# Patient Record
Sex: Female | Born: 1995 | Race: Asian | Hispanic: No | Marital: Single | Smoking: Former smoker
Health system: Southern US, Community
[De-identification: ages and names within clinical notes are randomized; demographics above are authoritative.]

## PROBLEM LIST (undated history)

## (undated) ENCOUNTER — Inpatient Hospital Stay (HOSPITAL_COMMUNITY): Payer: Self-pay

## (undated) DIAGNOSIS — O24419 Gestational diabetes mellitus in pregnancy, unspecified control: Secondary | ICD-10-CM

---
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
## 2013-03-10 ENCOUNTER — Inpatient Hospital Stay (HOSPITAL_COMMUNITY)
Admission: AD | Admit: 2013-03-10 | Discharge: 2013-03-11 | Disposition: A | Discharge status: Home / Self Care | Payer: Medicaid Other | Source: Ambulatory Visit | Attending: Obstetrics and Gynecology | Admitting: Obstetrics and Gynecology

## 2013-03-10 ENCOUNTER — Encounter (HOSPITAL_COMMUNITY): Payer: Self-pay | Admitting: *Deleted

## 2013-03-10 DIAGNOSIS — R109 Unspecified abdominal pain: Secondary | ICD-10-CM | POA: Insufficient documentation

## 2013-03-10 DIAGNOSIS — O034 Incomplete spontaneous abortion without complication: Secondary | ICD-10-CM | POA: Insufficient documentation

## 2013-03-10 LAB — URINE MICROSCOPIC-ADD ON

## 2013-03-10 LAB — URINALYSIS, ROUTINE W REFLEX MICROSCOPIC
Protein, ur: NEGATIVE mg/dL
Urobilinogen, UA: 0.2 mg/dL (ref 0.0–1.0)

## 2013-03-10 LAB — POCT PREGNANCY, URINE: Preg Test, Ur: POSITIVE — AB

## 2013-03-10 NOTE — MAU Note (Signed)
Pt reports she has been having spotting since last Thursday. Had U/s and was told she was [redacted] weeks pregnant. Pt c/o cramping and more bleeding.

## 2013-03-11 ENCOUNTER — Inpatient Hospital Stay (HOSPITAL_COMMUNITY): Payer: Medicaid Other

## 2013-03-11 ENCOUNTER — Encounter (HOSPITAL_COMMUNITY): Payer: Self-pay | Admitting: *Deleted

## 2013-03-11 DIAGNOSIS — O034 Incomplete spontaneous abortion without complication: Secondary | ICD-10-CM

## 2013-03-11 LAB — ABO/RH: ABO/RH(D): B POS

## 2013-03-11 NOTE — MAU Provider Note (Signed)
Chief Complaint: Vaginal Bleeding  First Provider Initiated Contact with Patient 03/11/13 0424     SUBJECTIVE HPI: Connie White is a 17 y.o. G1P0 at [redacted]w[redacted]d by ultrasound one week ago at [facility] who presents with increased vaginal bleeding and cramping over the past week.  Denies fever, chills, passage of tissue, vaginal discharge. Pelvic exam done at office visit 1 week ago.   Past Medical History  Diagnosis Date  . Medical history non-contributory    OB History   Grav Para Term Preterm Abortions TAB SAB Ect Mult Living   1              # Outc Date GA Lbr Len/2nd Wgt Sex Del Anes PTL Lv   1 CUR              Past Surgical History  Procedure Laterality Date  . No past surgeries     History   Social History  . Marital Status: Single    Spouse Name: N/A    Number of Children: N/A  . Years of Education: N/A   Occupational History  . Not on file.   Social History Main Topics  . Smoking status: Former Games developer  . Smokeless tobacco: Not on file  . Alcohol Use: No  . Drug Use: No  . Sexually Active: Yes   Other Topics Concern  . Not on file   Social History Narrative  . No narrative on file   No current facility-administered medications on file prior to encounter.   No current outpatient prescriptions on file prior to encounter.   No Known Allergies  ROS: Pertinent items in HPI  OBJECTIVE Blood pressure 133/79, pulse 88, temperature 98.7 F (37.1 C), temperature source Oral, resp. rate 18, height 4\' 7"  (1.397 m), weight 62.234 kg (137 lb 3.2 oz), last menstrual period 12/26/2012. GENERAL: Well-developed, well-nourished female in no acute distress.  HEENT: Normocephalic HEART: normal rate RESP: normal effort ABDOMEN: Soft, non-tender EXTREMITIES: Nontender, no edema NEURO: Alert and oriented SPECULUM EXAM: Deferred. Scant brown blood on pad.   LAB RESULTS Results for orders placed during the hospital encounter of 03/10/13 (from the past 24 hour(s))   URINALYSIS, ROUTINE W REFLEX MICROSCOPIC     Status: Abnormal   Collection Time    03/10/13 10:10 PM      Result Value Range   Color, Urine YELLOW  YELLOW   APPearance CLEAR  CLEAR   Specific Gravity, Urine 1.010  1.005 - 1.030   pH 7.0  5.0 - 8.0   Glucose, UA NEGATIVE  NEGATIVE mg/dL   Hgb urine dipstick LARGE (*) NEGATIVE   Bilirubin Urine NEGATIVE  NEGATIVE   Ketones, ur NEGATIVE  NEGATIVE mg/dL   Protein, ur NEGATIVE  NEGATIVE mg/dL   Urobilinogen, UA 0.2  0.0 - 1.0 mg/dL   Nitrite NEGATIVE  NEGATIVE   Leukocytes, UA SMALL (*) NEGATIVE  URINE MICROSCOPIC-ADD ON     Status: Abnormal   Collection Time    03/10/13 10:10 PM      Result Value Range   Squamous Epithelial / LPF MANY (*) RARE   WBC, UA 7-10  <3 WBC/hpf   RBC / HPF 11-20  <3 RBC/hpf   Bacteria, UA FEW (*) RARE  POCT PREGNANCY, URINE     Status: Abnormal   Collection Time    03/10/13 10:35 PM      Result Value Range   Preg Test, Ur POSITIVE (*) NEGATIVE  ABO/RH  Status: None   Collection Time    03/11/13  3:55 AM      Result Value Range   ABO/RH(D) B POS     IMAGING US Ob Comp Less 14 Wks  03/11/2013   *RADIOLOGY REPORT*  Clinical Data: same,bleeding in pregnancy. IUP confirmed on office [place] 1 week ago.; ;  OBSTETRIC <14 WK [place] AND TRANSVAGINAL OB US  Technique: Both transabdominal and transvaginal ultrasound examinations were performed for complete evaluation of the gestation as well as the maternal uterus, adnexal regions, and pelvic cul-de-sac.  Comparison: None.  Findings: Single intrauterine gestation is visualized.  Crown-rump length is 7.8 mm for an estimated gestational age of [redacted] weeks 5 days.  No cardiac activity detected.  Large subchorionic hemorrhage.  Ovaries are symmetric in size and echotexture.  No adnexal masses. No free fluid.  IMPRESSION: 6-week-5-day intrauterine pregnancy with large subchorionic hemorrhage.  No cardiac activity detected.  Recommend continued follow up.   Original Report  Authenticated By: Charlett Nose, M.D.   US Ob Transvaginal  03/11/2013   *RADIOLOGY REPORT*  Clinical Data: same,bleeding in pregnancy. IUP confirmed on office [place] 1 week ago.; ;  OBSTETRIC <14 WK [place] AND TRANSVAGINAL OB US  Technique: Both transabdominal and transvaginal ultrasound examinations were performed for complete evaluation of the gestation as well as the maternal uterus, adnexal regions, and pelvic cul-de-sac.  Comparison: None.  Findings: Single intrauterine gestation is visualized.  Crown-rump length is 7.8 mm for an estimated gestational age of [redacted] weeks 5 days.  No cardiac activity detected.  Large subchorionic hemorrhage.  Ovaries are symmetric in size and echotexture.  No adnexal masses. No free fluid.  IMPRESSION: 6-week-5-day intrauterine pregnancy with large subchorionic hemorrhage.  No cardiac activity detected.  Recommend continued follow up.   Original Report Authenticated By: Charlett Nose, M.D.    MAU COURSE  ASSESSMENT 1. Incomplete miscarriage     PLAN Discharge home in stable condition per consult w/ Dr. Tenny Craw. Discussed SAB management options including expectant management, cytotec of D&C if medically necessary. Pt prefers expectant management at this time.  Support given. Bleeding precautions.   Follow-up Information   Follow up with [facility] (1 week after pregnancy tissue passed or call in one week if no tissue passed. )    Contact information:   [address] [place] [address] [phone]      Follow up with [facility] [place] MATERNITY ADMISSIONS. (As needed for heavy bleeding, severe pain or fever greater than 100.4.)    Contact information:   [address] [id number] [place] [address] [phone]        Medication List    TAKE these medications       multivitamin-prenatal 27-0.8 MG Tabs  Take 1 tablet by mouth daily at 12 noon.       [place],  CNM 03/11/2013  4:30 AM

## 2013-03-12 LAB — URINE CULTURE: Colony Count: NO GROWTH

## 2013-09-16 NOTE — L&D Delivery Note (Signed)
Delivery Note At 2:26 PM a viable female was delivered via Vaginal, Spontaneous Delivery (Presentation: ; Occiput Anterior).  APGAR: , ; weight 5 lb 15.2 oz (2700 g).   Placenta status: Intact, Spontaneous.  Cord: 3 vessels with the following complications: None.  Cord pH: unable to obtain  Anesthesia: None  Episiotomy: None Lacerations: Perineal;1st degree Suture Repair: 3.0 vicryl rapide Est. Blood Loss (mL): 300  Mom to postpartum.  Baby to NICU. Placenta to: Pathology Feeding: Breast Circ: No Contraception: undecided  Delivery and repair performed by Ethelda Chickaroline Roberts MD under my supervision.  Dorathy KinsmanSMITH, Doralyn Kirkes 04/06/2014, 3:32 PM

## 2014-01-13 ENCOUNTER — Encounter (HOSPITAL_COMMUNITY): Payer: Self-pay | Admitting: *Deleted

## 2014-01-21 ENCOUNTER — Encounter (HOSPITAL_COMMUNITY): Payer: Self-pay | Admitting: *Deleted

## 2014-01-21 ENCOUNTER — Inpatient Hospital Stay (HOSPITAL_COMMUNITY)
Admission: AD | Admit: 2014-01-21 | Discharge: 2014-01-21 | Disposition: A | Discharge status: Home / Self Care | Payer: Medicaid Other | Source: Ambulatory Visit | Attending: Family Medicine | Admitting: Family Medicine

## 2014-01-21 DIAGNOSIS — O99891 Other specified diseases and conditions complicating pregnancy: Secondary | ICD-10-CM | POA: Diagnosis not present

## 2014-01-21 DIAGNOSIS — Z3200 Encounter for pregnancy test, result unknown: Secondary | ICD-10-CM

## 2014-01-21 DIAGNOSIS — O0930 Supervision of pregnancy with insufficient antenatal care, unspecified trimester: Secondary | ICD-10-CM | POA: Insufficient documentation

## 2014-01-21 DIAGNOSIS — O9989 Other specified diseases and conditions complicating pregnancy, childbirth and the puerperium: Principal | ICD-10-CM

## 2014-01-21 NOTE — MAU Provider Note (Signed)
  HPI:  Ms. Connie White is a 18 y.o. female G2P0011 at 6460w4d who presents to MAU for a checkup. She has no prenatal care to date.  She went to the clinic this morning to try and be seen today, however she was sent here. She reports good fetal movement, denies LOF, vaginal bleeding, vaginal itching/burning, urinary symptoms, h/a, dizziness, n/v, or fever/chills.  Currently the patient has no pregnancy concerns, she just wants to start prenatal care.    Objective:  GENERAL: Well-developed, well-nourished female in no acute distress.  HEENT: Normocephalic, atraumatic.   LUNGS: Effort normal HEART: Regular rate  SKIN: Warm, dry and without erythema PSYCH: Normal mood and affect  Filed Vitals:   01/21/14 0915  BP: 131/83  Pulse: 112  Temp: 98.2 F (36.8 C)  Resp: 18    MDM  +fht  A:  1. No prenatal care in current pregnancy   2. Encounter for pregnancy test     P:  Discharge home in stable condition Referral sent to the clinic; pt scheduled in May Detailed US scheduled; radiology to call patient to confirm appt.  Warning signs discussed Return to MAU with any pain and/or bleeding   Iona HansenJennifer Irene Shene Maxfield, NP 01/21/2014 3:05 PM

## 2014-01-21 NOTE — MAU Note (Signed)
Pt presents to MAU stating that she needs a OB check up because she is [redacted] weeks pregnant LMP 07/26/2013. Denies any vaginal bleeding or pain. She says she went to the clinic and was told that she had to have an appointment.

## 2014-01-21 NOTE — MAU Provider Note (Signed)
Attestation of Attending Supervision of Advanced Practitioner (PA/CNM/NP): Evaluation and management procedures were performed by the Advanced Practitioner under my supervision and collaboration.  I have reviewed the Advanced Practitioner's note and chart, and I agree with the management and plan.  Layton Naves S Jurni Cesaro, MD [facility] Attending 01/21/2014 4:38 PM   

## 2014-01-27 ENCOUNTER — Ambulatory Visit (HOSPITAL_COMMUNITY): Admit: 2014-01-27 | Payer: Medicaid Other

## 2014-02-09 ENCOUNTER — Ambulatory Visit (INDEPENDENT_AMBULATORY_CARE_PROVIDER_SITE_OTHER): Payer: Medicaid Other | Admitting: Advanced Practice Midwife

## 2014-02-09 ENCOUNTER — Encounter: Payer: Self-pay | Admitting: Advanced Practice Midwife

## 2014-02-09 VITALS — BP 122/75 | HR 108 | Temp 98.3°F | Wt 148.1 lb

## 2014-02-09 DIAGNOSIS — O0930 Supervision of pregnancy with insufficient antenatal care, unspecified trimester: Secondary | ICD-10-CM | POA: Insufficient documentation

## 2014-02-09 LAB — POCT URINALYSIS DIP (DEVICE)
Bilirubin Urine: NEGATIVE
Glucose, UA: NEGATIVE mg/dL
Hgb urine dipstick: NEGATIVE
Ketones, ur: NEGATIVE mg/dL
Nitrite: NEGATIVE
PROTEIN: NEGATIVE mg/dL
SPECIFIC GRAVITY, URINE: 1.015 (ref 1.005–1.030)
UROBILINOGEN UA: 1 mg/dL (ref 0.0–1.0)
pH: 7.5 (ref 5.0–8.0)

## 2014-02-09 NOTE — Progress Notes (Signed)
Occasional edema in feet.  Initial visit. New OB labs and 1hr gtt today. Tdap today.  Discussed appropriate weight gain. Pt. Verbalized understanding.  New OB packet given.

## 2014-02-09 NOTE — Progress Notes (Signed)
   Subjective:    Connie White is a G2P0011 [redacted]w[redacted]d being seen today for her first obstetrical visit.  Her obstetrical history is significant for None G1. Patient does intend to breast feed. Pregnancy history fully reviewed.  Patient reports no complaints.  Filed Vitals:   02/09/14 1420  BP: 122/75  Pulse: 108  Temp: 98.3 F (36.8 C)  Weight: 148 lb 1.6 oz (67.178 kg)    HISTORY: OB History  Gravida Para Term Preterm AB SAB TAB Ectopic Multiple Living  2 0 0 0 1 1 0 0 0 1     # Outcome Date GA Lbr Len/2nd Weight Sex Delivery Anes PTL Lv  2 CUR           1 SAB 2014             Past Medical History  Diagnosis Date  . Medical history non-contributory    Past Surgical History  Procedure Laterality Date  . No past surgeries     History reviewed. No pertinent family history.   Exam    Uterus:   FH 28 cm  Pelvic Exam:    Perineum: No Hemorrhoids, Normal Perineum   Vulva: normal   Vagina:  normal mucosa, normal discharge   pH:    Cervix: no cervical motion tenderness   Adnexa: not evaluated   Bony Pelvis: average  System: Breast:  normal appearance, no masses or tenderness   Skin: normal coloration and turgor, no rashes    Neurologic: oriented, normal mood, gait normal; reflexes normal and symmetric   Extremities: normal strength, tone, and muscle mass, ROM of all joints is normal   HEENT neck supple with midline trachea and thyroid without masses   Mouth/Teeth mucous membranes moist, pharynx normal without lesions and dental hygiene good   Neck supple and no masses   Cardiovascular: regular rate and rhythm   Respiratory:  appears well, vitals normal, no respiratory distress, acyanotic, normal RR, ear and throat exam is normal, neck free of mass or lymphadenopathy, chest clear, no wheezing, crepitations, rhonchi, normal symmetric air entry   Abdomen: soft, non-tender; bowel sounds normal; no masses,  no organomegaly   Urinary: urethral meatus normal       Assessment:    Pregnancy: [id number] Patient Active Problem List   Diagnosis Date Noted  . Late prenatal care complicating pregnancy 02/09/2014  . ANEMIA, OTHER, UNSPECIFIED 11/13/2006  . HEADACHE, UNSPECIFIED 11/13/2006        Plan:     Initial labs drawn. Prenatal vitamins. Problem list reviewed and updated. Genetic Screening discussed: late to care   Ultrasound discussed; fetal survey: ordered.  Follow up in 2 weeks. 50% of 30 min visit spent on counseling and coordination of care.     Wilmer Floor Leftwich-Kirby 02/09/2014

## 2014-02-10 LAB — PRESCRIPTION MONITORING PROFILE (19 PANEL)
Amphetamine/Meth: NEGATIVE ng/mL
Barbiturate Screen, Urine: NEGATIVE ng/mL
Benzodiazepine Screen, Urine: NEGATIVE ng/mL
Buprenorphine, Urine: NEGATIVE ng/mL
CANNABINOID SCRN UR: NEGATIVE ng/mL
CARISOPRODOL, URINE: NEGATIVE ng/mL
COCAINE METABOLITES: NEGATIVE ng/mL
CREATININE, URINE: 94.99 mg/dL (ref >20.0)
ECSTASY: NEGATIVE ng/mL
FENTANYL URINE: NEGATIVE ng/mL
MEPERIDINE UR: NEGATIVE ng/mL
METHADONE SCREEN, URINE: NEGATIVE ng/mL
Methaqualone: NEGATIVE ng/mL
Nitrites, Initial: NEGATIVE ug/mL
OPIATE SCREEN, URINE: NEGATIVE ng/mL
OXYCODONE SCRN UR: NEGATIVE ng/mL
PHENCYCLIDINE, UR: NEGATIVE ng/mL
Propoxyphene: NEGATIVE ng/mL
Tapentadol, urine: NEGATIVE ng/mL
Tramadol Scrn, Ur: NEGATIVE ng/mL
Zolpidem, Urine: NEGATIVE ng/mL
pH, Initial: 7.9 pH (ref 4.5–8.9)

## 2014-02-10 LAB — OBSTETRIC PANEL
Antibody Screen: NEGATIVE
Basophils Absolute: 0 10*3/uL (ref 0.0–0.1)
Basophils Relative: 0 % (ref 0–1)
EOS PCT: 1 % (ref 0–5)
Eosinophils Absolute: 0.1 10*3/uL (ref 0.0–0.7)
HCT: 31.9 % — ABNORMAL LOW (ref 36.0–46.0)
HEMOGLOBIN: 10.5 g/dL — AB (ref 12.0–15.0)
HEP B S AG: NEGATIVE
LYMPHS ABS: 1.6 10*3/uL (ref 0.7–4.0)
LYMPHS PCT: 13 % (ref 12–46)
MCH: 22.7 pg — ABNORMAL LOW (ref 26.0–34.0)
MCHC: 32.9 g/dL (ref 30.0–36.0)
MCV: 68.9 fL — AB (ref 78.0–100.0)
MONOS PCT: 8 % (ref 3–12)
Monocytes Absolute: 1 10*3/uL (ref 0.1–1.0)
NEUTROS PCT: 78 % — AB (ref 43–77)
Neutro Abs: 9.4 10*3/uL — ABNORMAL HIGH (ref 1.7–7.7)
PLATELETS: 406 10*3/uL — AB (ref 150–400)
RBC: 4.63 MIL/uL (ref 3.87–5.11)
RDW: 13.9 % (ref 11.5–15.5)
RH TYPE: POSITIVE
RUBELLA: 0.69 {index} (ref <0.90)
WBC: 12.1 10*3/uL — AB (ref 4.0–10.5)

## 2014-02-10 LAB — HIV ANTIBODY (ROUTINE TESTING W REFLEX): HIV 1&2 Ab, 4th Generation: NONREACTIVE

## 2014-02-10 LAB — GLUCOSE TOLERANCE, 1 HOUR (50G) W/O FASTING: Glucose, 1 Hour GTT: 140 mg/dL (ref 70–140)

## 2014-02-11 LAB — GC/CHLAMYDIA PROBE AMP
CT Probe RNA: NEGATIVE
GC Probe RNA: NEGATIVE

## 2014-02-11 LAB — CULTURE, OB URINE

## 2014-02-14 ENCOUNTER — Ambulatory Visit (HOSPITAL_COMMUNITY)
Admission: RE | Admit: 2014-02-14 | Discharge: 2014-02-14 | Disposition: A | Discharge status: Home / Self Care | Payer: Medicaid Other | Source: Ambulatory Visit | Attending: Obstetrics and Gynecology | Admitting: Obstetrics and Gynecology

## 2014-02-14 ENCOUNTER — Other Ambulatory Visit (HOSPITAL_COMMUNITY): Payer: Self-pay | Admitting: Obstetrics and Gynecology

## 2014-02-14 DIAGNOSIS — Z3200 Encounter for pregnancy test, result unknown: Secondary | ICD-10-CM

## 2014-02-14 DIAGNOSIS — Z3689 Encounter for other specified antenatal screening: Secondary | ICD-10-CM | POA: Insufficient documentation

## 2014-02-14 DIAGNOSIS — O0930 Supervision of pregnancy with insufficient antenatal care, unspecified trimester: Secondary | ICD-10-CM | POA: Insufficient documentation

## 2014-02-25 ENCOUNTER — Ambulatory Visit (INDEPENDENT_AMBULATORY_CARE_PROVIDER_SITE_OTHER): Payer: Medicaid Other | Admitting: Advanced Practice Midwife

## 2014-02-25 ENCOUNTER — Encounter: Payer: Self-pay | Admitting: Advanced Practice Midwife

## 2014-02-25 VITALS — BP 118/77 | HR 95 | Temp 97.7°F | Wt 148.0 lb

## 2014-02-25 DIAGNOSIS — Z3480 Encounter for supervision of other normal pregnancy, unspecified trimester: Secondary | ICD-10-CM

## 2014-02-25 DIAGNOSIS — Z3490 Encounter for supervision of normal pregnancy, unspecified, unspecified trimester: Secondary | ICD-10-CM

## 2014-02-25 LAB — POCT URINALYSIS DIP (DEVICE)
Bilirubin Urine: NEGATIVE
GLUCOSE, UA: NEGATIVE mg/dL
HGB URINE DIPSTICK: NEGATIVE
Ketones, ur: NEGATIVE mg/dL
NITRITE: NEGATIVE
Protein, ur: NEGATIVE mg/dL
Specific Gravity, Urine: 1.02 (ref 1.005–1.030)
UROBILINOGEN UA: 1 mg/dL (ref 0.0–1.0)
pH: 7.5 (ref 5.0–8.0)

## 2014-02-25 MED ORDER — RANITIDINE HCL 150 MG PO TABS: 150.0000 mg | ORAL_TABLET | Freq: Two times a day (BID) | ORAL | Status: DC

## 2014-02-25 NOTE — Progress Notes (Signed)
Pt is concerned she has a UTI or Vaginal infection due to odor and crampy feeling.  Pt reports pain in rt hand

## 2014-02-25 NOTE — Addendum Note (Signed)
Addended by: Aldona LentoFISHER, Daysen Gundrum L on: 02/25/2014 10:16 AM   Modules accepted: Orders

## 2014-02-25 NOTE — Progress Notes (Signed)
S: oing well.  Good fetal movement, denies vaginal bleeding, LOF, regular contractions.  Has daily heartburn, reports cramping and intermittent contractions x1 week.   Also reports pain with movement in her right hand, while cooking/cleaning. She also reports vaginal discharge with odor.   O: Mild pain to palpation in epigastric area and upper right abdomen.  Cervical exam and wet prep today. Urine results with small leukocytes so sent for culture. A: Acid reflux Preterm contractions P: Zantac 150 mg BID

## 2014-02-26 LAB — WET PREP, GENITAL
Trich, Wet Prep: NONE SEEN
Yeast Wet Prep HPF POC: NONE SEEN

## 2014-02-28 ENCOUNTER — Other Ambulatory Visit: Payer: Medicaid Other

## 2014-02-28 DIAGNOSIS — R7309 Other abnormal glucose: Secondary | ICD-10-CM

## 2014-02-28 LAB — POCT URINALYSIS DIP (DEVICE)
BILIRUBIN URINE: NEGATIVE
Glucose, UA: NEGATIVE mg/dL
Hgb urine dipstick: NEGATIVE
KETONES UR: NEGATIVE mg/dL
LEUKOCYTES UA: NEGATIVE
Nitrite: NEGATIVE
PH: 7.5 (ref 5.0–8.0)
Protein, ur: NEGATIVE mg/dL
Specific Gravity, Urine: 1.02 (ref 1.005–1.030)
Urobilinogen, UA: 1 mg/dL (ref 0.0–1.0)

## 2014-02-28 LAB — CULTURE, OB URINE

## 2014-03-01 ENCOUNTER — Telehealth: Payer: Self-pay

## 2014-03-01 LAB — GLUCOSE TOLERANCE, 3 HOURS
GLUCOSE 3 HOUR GTT: 147 mg/dL — AB (ref 70–144)
GLUCOSE, 1 HOUR-GESTATIONAL: 246 mg/dL — AB (ref 70–189)
GLUCOSE, 2 HOUR-GESTATIONAL: 144 mg/dL (ref 70–164)
GLUCOSE, FASTING-GESTATIONAL: 105 mg/dL — AB (ref 70–104)

## 2014-03-01 NOTE — Telephone Encounter (Signed)
Called pt and informed pt that her 3 hr glucose test resulted abnormal and that she is a GDM.  I advised pt that her appts will most likely be on Mondays cause we have diabetes educators on that day.  And I also advised pt that we need her to come in June 22nd for diabetes education.  Pt stated that she would be able to come in on June 22nd @ 0800.  I informed pt that the appt is just for diabetes education and that she will not be seeing a provider that day.  Pt stated understanding with no further questions.

## 2014-03-03 ENCOUNTER — Other Ambulatory Visit: Payer: Self-pay | Admitting: Advanced Practice Midwife

## 2014-03-03 MED ORDER — NITROFURANTOIN MONOHYD MACRO 100 MG PO CAPS: 100.0000 mg | ORAL_CAPSULE | Freq: Two times a day (BID) | ORAL | Status: DC

## 2014-03-04 ENCOUNTER — Telehealth: Payer: Self-pay

## 2014-03-04 NOTE — Telephone Encounter (Signed)
[facility] [phone]-- someone picked up but would not speak. Will inform patient at appointment on 03/07/14.

## 2014-03-04 NOTE — Telephone Encounter (Signed)
Message copied by Louanna RawAMPBELL, TAYLOR M on Fri Mar 04, 2014 11:42 AM ------      Message from: LEFTWICH-KIRBY, LISA A      Created: Thu Mar 03, 2014  3:54 PM       Pt urine positive for UTI.  Macrobid 100 mg BID x 7 days sent to pt pharmacy. Please call to let her know. ------

## 2014-03-07 ENCOUNTER — Ambulatory Visit (INDEPENDENT_AMBULATORY_CARE_PROVIDER_SITE_OTHER): Payer: Medicaid Other | Admitting: Obstetrics & Gynecology

## 2014-03-07 ENCOUNTER — Encounter: Payer: Medicaid Other | Attending: Obstetrics and Gynecology | Admitting: *Deleted

## 2014-03-07 VITALS — Wt 150.1 lb

## 2014-03-07 DIAGNOSIS — Z713 Dietary counseling and surveillance: Secondary | ICD-10-CM | POA: Insufficient documentation

## 2014-03-07 DIAGNOSIS — O99810 Abnormal glucose complicating pregnancy: Secondary | ICD-10-CM

## 2014-03-07 MED ORDER — GLUCOSE BLOOD VI STRP: ORAL_STRIP | Status: DC

## 2014-03-07 MED ORDER — ACCU-CHEK FASTCLIX LANCETS MISC: 1.0000 | Freq: Four times a day (QID) | Status: DC

## 2014-03-07 NOTE — Progress Notes (Signed)
Patient notified of UTI and RX to be started. RX awaiting her pick up. Patient verbalized understanding.

## 2014-03-07 NOTE — Progress Notes (Signed)
Nutrition note: DM diet education Pt has h/o obesity and has DM (question if type 2 vs GDM). Pt has gained 15.1# @ 4978w4d, which is slightly > expected. Pt reports eating 3 meals & 2-3 snacks/d. Pt is taking a PNV.  Pt reports no N/V but has some heartburn & zantac is helping. NKFA. Pt reports some physical activity. Pt received verbal & written education on DM diet during pregnancy. Discussed wt gain goals of 11-20# or 0.5#/wk. Pt agrees to follow DM diet with 3 meals & 2-3 snacks/d with proper CHO/ protein combination. Pt has WIC & plans to BF. F/u in 2-4 wks Blondell RevealLaura Smith, MS, RD, LDN, [facility]

## 2014-03-07 NOTE — Progress Notes (Signed)
DIABETES: in 2013 patient had a FBS reading of 100, her mother has T2DM, Today's FBS was 11m/dl. I question if patient is actually T2DM vs. GDM. Will reschudule patient for 3 day f/u for glucose log evaluation by MD. Return again in 1 week for DM educator and MD f/u. Will cancel 03/16/14 appt.  Patient was seen on 03/07/14 for Gestational Diabetes self-management . The following learning objectives were met by the patient :    States the definition of Gestational Diabetes  States why dietary management is important in controlling blood glucose  Describes the effects of carbohydrates on blood glucose levels  Demonstrates ability to create a balanced meal plan  Demonstrates carbohydrate counting   States when to check blood glucose levels  Demonstrates proper blood glucose monitoring techniques  States the effect of stress and exercise on blood glucose levels  States the importance of limiting caffeine and abstaining from alcohol and smoking  Plan:  Consider  increasing your activity level by walking daily as tolerated Begin checking BG before breakfast and 2 hours after first bit of breakfast, lunch and dinner after  as directed by MD  Take medication  as directed by MD  Blood glucose monitor given: Accu-Ck Nano  Lot # [id number]: 09/15/14 Blood glucose reading: 126  Patient instructed to monitor glucose levels: FBS: 60 - <90 1 hour: <140  Patient received the following handouts:  Nutrition Diabetes and Pregnancy  Patient will be seen for follow-up as needed.

## 2014-03-10 ENCOUNTER — Ambulatory Visit (INDEPENDENT_AMBULATORY_CARE_PROVIDER_SITE_OTHER): Payer: Medicaid Other | Admitting: Obstetrics and Gynecology

## 2014-03-10 ENCOUNTER — Encounter: Payer: Self-pay | Admitting: Obstetrics and Gynecology

## 2014-03-10 VITALS — BP 112/72 | HR 98 | Temp 97.5°F | Wt 149.3 lb

## 2014-03-10 DIAGNOSIS — O24415 Gestational diabetes mellitus in pregnancy, controlled by oral hypoglycemic drugs: Secondary | ICD-10-CM | POA: Insufficient documentation

## 2014-03-10 DIAGNOSIS — O99810 Abnormal glucose complicating pregnancy: Secondary | ICD-10-CM

## 2014-03-10 DIAGNOSIS — O24410 Gestational diabetes mellitus in pregnancy, diet controlled: Secondary | ICD-10-CM

## 2014-03-10 DIAGNOSIS — O0930 Supervision of pregnancy with insufficient antenatal care, unspecified trimester: Secondary | ICD-10-CM

## 2014-03-10 DIAGNOSIS — O0933 Supervision of pregnancy with insufficient antenatal care, third trimester: Secondary | ICD-10-CM

## 2014-03-10 LAB — POCT URINALYSIS DIP (DEVICE)
BILIRUBIN URINE: NEGATIVE
Glucose, UA: NEGATIVE mg/dL
Hgb urine dipstick: NEGATIVE
KETONES UR: NEGATIVE mg/dL
NITRITE: NEGATIVE
PH: 7.5 (ref 5.0–8.0)
Protein, ur: 30 mg/dL — AB
Specific Gravity, Urine: 1.02 (ref 1.005–1.030)
Urobilinogen, UA: 0.2 mg/dL (ref 0.0–1.0)

## 2014-03-10 MED ORDER — GLUCOSE BLOOD VI STRP: ORAL_STRIP | Status: DC

## 2014-03-10 NOTE — Progress Notes (Signed)
Patient is doing well without complaints. Patient with recent diagnosis of GDM. Has only checked CBG on 6/22 and a few values on 6/23. Majority out of range. Patient states that she is trying to change her diet but it is hard at times. Diet reviewed and importance of achieving euglycemia reviewed. Will schedule follow up growth ultrasound

## 2014-03-10 NOTE — Progress Notes (Signed)
U/S scheduled 03/14/14 at 315pm.

## 2014-03-10 NOTE — Progress Notes (Signed)
Pain- back, "cramping"  Pressure- lower abd Pt c/o diarrhea x 1 this am.  Pt started taking macrobid recently for UTI.  I informed pt that her diarrhea may be related to the antibiotic therapy but to continue to monitor for consistency and if continues to give the clinic a call.

## 2014-03-14 ENCOUNTER — Ambulatory Visit (HOSPITAL_COMMUNITY)
Admission: RE | Admit: 2014-03-14 | Discharge: 2014-03-14 | Disposition: A | Discharge status: Home / Self Care | Payer: Medicaid Other | Source: Ambulatory Visit | Attending: Obstetrics and Gynecology | Admitting: Obstetrics and Gynecology

## 2014-03-14 ENCOUNTER — Encounter: Payer: Medicaid Other | Admitting: Obstetrics & Gynecology

## 2014-03-14 DIAGNOSIS — O99810 Abnormal glucose complicating pregnancy: Secondary | ICD-10-CM | POA: Insufficient documentation

## 2014-03-14 DIAGNOSIS — O24410 Gestational diabetes mellitus in pregnancy, diet controlled: Secondary | ICD-10-CM

## 2014-03-14 DIAGNOSIS — O0930 Supervision of pregnancy with insufficient antenatal care, unspecified trimester: Secondary | ICD-10-CM | POA: Insufficient documentation

## 2014-03-14 DIAGNOSIS — O0933 Supervision of pregnancy with insufficient antenatal care, third trimester: Secondary | ICD-10-CM

## 2014-03-16 ENCOUNTER — Encounter: Payer: Medicaid Other | Admitting: Advanced Practice Midwife

## 2014-03-21 ENCOUNTER — Ambulatory Visit (INDEPENDENT_AMBULATORY_CARE_PROVIDER_SITE_OTHER): Payer: Medicaid Other | Admitting: Obstetrics & Gynecology

## 2014-03-21 ENCOUNTER — Encounter: Payer: Self-pay | Admitting: Obstetrics & Gynecology

## 2014-03-21 VITALS — BP 115/77 | HR 87 | Temp 98.0°F | Wt 152.2 lb

## 2014-03-21 DIAGNOSIS — Z23 Encounter for immunization: Secondary | ICD-10-CM

## 2014-03-21 DIAGNOSIS — O0931 Supervision of pregnancy with insufficient antenatal care, first trimester: Secondary | ICD-10-CM

## 2014-03-21 DIAGNOSIS — O0930 Supervision of pregnancy with insufficient antenatal care, unspecified trimester: Secondary | ICD-10-CM

## 2014-03-21 LAB — POCT URINALYSIS DIP (DEVICE)
Bilirubin Urine: NEGATIVE
GLUCOSE, UA: NEGATIVE mg/dL
Hgb urine dipstick: NEGATIVE
KETONES UR: NEGATIVE mg/dL
Nitrite: NEGATIVE
PROTEIN: NEGATIVE mg/dL
SPECIFIC GRAVITY, URINE: 1.02 (ref 1.005–1.030)
UROBILINOGEN UA: 0.2 mg/dL (ref 0.0–1.0)
pH: 7.5 (ref 5.0–8.0)

## 2014-03-21 MED ORDER — TETANUS-DIPHTH-ACELL PERTUSSIS 5-2.5-18.5 LF-MCG/0.5 IM SUSP
0.5000 mL | Freq: Once | INTRAMUSCULAR | Status: AC
  Administered 2014-03-21: 0.5 mL via INTRAMUSCULAR

## 2014-03-21 MED ORDER — GLYBURIDE 2.5 MG PO TABS: 2.5000 mg | ORAL_TABLET | Freq: Two times a day (BID) | ORAL | Status: DC

## 2014-03-21 NOTE — Patient Instructions (Signed)
Gestational Diabetes Mellitus Gestational diabetes mellitus, often simply referred to as gestational diabetes, is a type of diabetes that some women develop during pregnancy. In gestational diabetes, the pancreas does not make enough insulin (a hormone), the cells are less responsive to the insulin that is made (insulin resistance), or both.Normally, insulin moves sugars from food into the tissue cells. The tissue cells use the sugars for energy. The lack of insulin or the lack of normal response to insulin causes excess sugars to build up in the blood instead of going into the tissue cells. As a result, high blood sugar (hyperglycemia) develops. The effect of high sugar (glucose) levels can cause many complications.  RISK FACTORS You have an increased chance of developing gestational diabetes if you have a family history of diabetes and also have one or more of the following risk factors:  A body mass index over 30 (obesity).  A previous pregnancy with gestational diabetes.  An older age at the time of pregnancy. If blood glucose levels are kept in the normal range during pregnancy, women can have a healthy pregnancy. If your blood glucose levels are not well controlled, there may be risks to you, your unborn baby (fetus), your labor and delivery, or your newborn baby.  SYMPTOMS  If symptoms are experienced, they are much like symptoms you would normally expect during pregnancy. The symptoms of gestational diabetes include:   Increased thirst (polydipsia).  Increased urination (polyuria).  Increased urination during the night (nocturia).  Weight loss. This weight loss may be rapid.  Frequent, recurring infections.  Tiredness (fatigue).  Weakness.  Vision changes, such as blurred vision.  Fruity smell to your breath.  Abdominal pain. DIAGNOSIS Diabetes is diagnosed when blood glucose levels are increased. Your blood glucose level may be checked by one or more of the following blood  tests:  A fasting blood glucose test. You will not be allowed to eat for at least 8 hours before a blood sample is taken.  A random blood glucose test. Your blood glucose is checked at any time of the day regardless of when you ate.  A hemoglobin A1c blood glucose test. A hemoglobin A1c test provides information about blood glucose control over the previous 3 months.  An oral glucose tolerance test (OGTT). Your blood glucose is measured after you have not eaten (fasted) for 1-3 hours and then after you drink a glucose-containing beverage. Since the hormones that cause insulin resistance are highest at about 24-28 weeks of a pregnancy, an OGTT is usually performed during that time. If you have risk factors for gestational diabetes, your healthcare provider may test you for gestational diabetes earlier than 24 weeks of pregnancy. TREATMENT   You will need to take diabetes medicine or insulin daily to keep blood glucose levels in the desired range.  You will need to match insulin dosing with exercise and healthy food choices. The treatment goal is to maintain the before meal (preprandial), bedtime, and overnight blood glucose level at 60-99 mg/dL during pregnancy. The treatment goal is to further maintain peak after meal blood sugar (postprandial glucose) level at 100-140 mg/dL. HOME CARE INSTRUCTIONS   Have your hemoglobin A1c level checked twice a year.  Perform daily blood glucose monitoring as directed by your healthcare provider. It is common to perform frequent blood glucose monitoring.  Monitor urine ketones when you are ill and as directed by your healthcare provider.  Take your diabetes medicine and insulin as directed by your healthcare provider to maintain   your blood glucose level in the desired range.  Never run out of diabetes medicine or insulin. It is needed every day.  Adjust insulin based on your intake of carbohydrates. Carbohydrates can raise blood glucose levels but need  to be included in your diet. Carbohydrates provide vitamins, minerals, and fiber which are an essential part of a healthy diet. Carbohydrates are found in fruits, vegetables, whole grains, dairy products, legumes, and foods containing added sugars.  Eat healthy foods. Alternate 3 meals with 3 snacks.  Maintain a healthy weight gain. The usual total expected weight gain varies according to your prepregnancy body mass index (BMI).  Carry a medical alert card or wear your medical alert jewelry.  Carry a 15 gram carbohydrate snack with you at all times to treat low blood glucose (hypoglycemia). Some examples of 15 gram carbohydrate snacks include:  Glucose tablets, 3 or 4  Glucose gel, 15 gram tube  Raisins, 2 tablespoons (24 g)  Jelly beans, 6  Animal crackers, 8  Fruit juice, regular soda, or low fat milk, 4 ounces (120 mL)  Gummy treats, 9  Recognize hypoglycemia. Hypoglycemia during pregnancy occurs with blood glucose levels of 60 mg/dL and below. The risk for hypoglycemia increases when fasting or skipping meals, during or after intense exercise, and during sleep. Hypoglycemia symptoms can include:  Tremors or shakes.  Decreased ability to concentrate.  Sweating.  Increased heart rate.  Headache.  Dry mouth.  Hunger.  Irritability.  Anxiety.  Restless sleep.  Altered speech or coordination.  Confusion.  Treat hypoglycemia promptly. If you are alert and able to safely swallow, follow the 15:15 rule:  Take 15-20 grams of rapid-acting glucose or carbohydrate. Rapid-acting options include glucose gel, glucose tablets, or 4 ounces (120 mL) of fruit juice, regular soda, or low fat milk.  Check your blood glucose level 15 minutes after taking the glucose.  Take 15-20 grams more of glucose if the repeat blood glucose level is still 70 mg/dL or below.  Eat a meal or snack within 1 hour once blood glucose levels return to normal.  Be alert to polyuria (excess  urination) and polydipsia (excess thirst) which are early signs of hyperglycemia. An early awareness of hyperglycemia allows for prompt treatment. Treat hyperglycemia as directed by your healthcare provider.  Engage in at least 30 minutes of physical activity a day or as directed by your healthcare provider. Ten minutes of physical activity timed 30 minutes after each meal is encouraged to control postprandial blood glucose levels.  Adjust your insulin dosing and food intake as needed if you start a new exercise or sport.  Follow your sick day plan at any time you are unable to eat or drink as usual.  Avoid tobacco and alcohol use.  Follow up with your healthcare provider regularly.  Follow the advice of your healthcare provider regarding your prenatal and post-delivery (postpartum) appointments, meal planning, exercise, medicines, vitamins, blood tests, other medical tests, and physical activities.  Perform daily skin and foot care. Examine your skin and feet daily for cuts, bruises, redness, nail problems, bleeding, blisters, or sores.  Brush your teeth and gums at least twice a day and floss at least once a day. Follow up with your dentist regularly.  Schedule an eye exam during the first trimester of your pregnancy or as directed by your healthcare provider.  Share your diabetes management plan with your workplace or school.  Stay up-to-date with immunizations.  Learn to manage stress.  Obtain ongoing diabetes education   and support as needed.  Learn about and consider breastfeeding your baby.  You should have your blood sugar level checked 6-12 weeks after delivery. This is done with an oral glucose tolerance test (OGTT). SEEK MEDICAL CARE IF:   You are unable to eat food or drink fluids for more than 6 hours.  You have nausea and vomiting for more than 6 hours.  You have a blood glucose level of 200 mg/dL and you have ketones in your urine.  There is a change in mental  status.  You develop vision problems.  You have a persistent headache.  You have upper abdominal pain or discomfort.  You develop an additional serious illness.  You have diarrhea for more than 6 hours.  You have been sick or have had a fever for a couple of days and are not getting better. SEEK IMMEDIATE MEDICAL CARE IF:   You have difficulty breathing.  You no longer feel the baby moving.  You are bleeding or have discharge from your vagina.  You start having premature contractions or labor. MAKE SURE YOU:  Understand these instructions.  Will watch your condition.  Will get help right away if you are not doing well or get worse. Document Released: 12/09/2000 Document Revised: 09/07/2013 Document Reviewed: 03/31/2012 ExitCare Patient Information 2015 [employer]. This information is not intended to replace advice given to you by your health care provider. Make sure you discuss any questions you have with your health care provider.  

## 2014-03-21 NOTE — Progress Notes (Signed)
Irregular mealtimes, FBS 109-135, breakfast 114-176, lunch 104, 148, 225, dinner 119-159 glyburide 2.5 mg BID  NST biweekly

## 2014-03-21 NOTE — Progress Notes (Signed)
Pt reports pain in rt side/lower abdomen last night, but denies pain today.  Needs Tdap vaccine today.

## 2014-03-25 ENCOUNTER — Ambulatory Visit (INDEPENDENT_AMBULATORY_CARE_PROVIDER_SITE_OTHER): Payer: Medicaid Other | Admitting: *Deleted

## 2014-03-25 VITALS — BP 120/66 | HR 90

## 2014-03-25 DIAGNOSIS — O99810 Abnormal glucose complicating pregnancy: Secondary | ICD-10-CM

## 2014-03-25 NOTE — Progress Notes (Signed)
NST performed today was reviewed and was found to be reactive.  Continue recommended antenatal testing and prenatal care.  

## 2014-03-28 ENCOUNTER — Ambulatory Visit (INDEPENDENT_AMBULATORY_CARE_PROVIDER_SITE_OTHER): Payer: Medicaid Other | Admitting: Obstetrics & Gynecology

## 2014-03-28 VITALS — BP 112/77 | HR 86 | Temp 97.2°F | Wt 154.6 lb

## 2014-03-28 DIAGNOSIS — O0931 Supervision of pregnancy with insufficient antenatal care, first trimester: Secondary | ICD-10-CM

## 2014-03-28 DIAGNOSIS — O99810 Abnormal glucose complicating pregnancy: Secondary | ICD-10-CM

## 2014-03-28 DIAGNOSIS — O0930 Supervision of pregnancy with insufficient antenatal care, unspecified trimester: Secondary | ICD-10-CM

## 2014-03-28 LAB — POCT URINALYSIS DIP (DEVICE)
Bilirubin Urine: NEGATIVE
Glucose, UA: NEGATIVE mg/dL
Hgb urine dipstick: NEGATIVE
KETONES UR: NEGATIVE mg/dL
NITRITE: NEGATIVE
PROTEIN: NEGATIVE mg/dL
Specific Gravity, Urine: 1.015 (ref 1.005–1.030)
Urobilinogen, UA: 1 mg/dL (ref 0.0–1.0)
pH: 7.5 (ref 5.0–8.0)

## 2014-03-28 LAB — US OB FOLLOW UP

## 2014-03-28 MED ORDER — GLYBURIDE 5 MG PO TABS: 5.0000 mg | ORAL_TABLET | Freq: Two times a day (BID) | ORAL | Status: DC

## 2014-03-28 NOTE — Progress Notes (Signed)
FBS 107-144, breakfast 111-172, lunch 105-210, dinner 165-244, will increase to 5 mg gy\lyburide BID but likely to start insulin after this if not controlled. NST reactive today

## 2014-03-28 NOTE — Progress Notes (Signed)
Patient reports some cramping

## 2014-03-28 NOTE — Patient Instructions (Signed)
Gestational Diabetes Mellitus Gestational diabetes mellitus, often simply referred to as gestational diabetes, is a type of diabetes that some women develop during pregnancy. In gestational diabetes, the pancreas does not make enough insulin (a hormone), the cells are less responsive to the insulin that is made (insulin resistance), or both.Normally, insulin moves sugars from food into the tissue cells. The tissue cells use the sugars for energy. The lack of insulin or the lack of normal response to insulin causes excess sugars to build up in the blood instead of going into the tissue cells. As a result, high blood sugar (hyperglycemia) develops. The effect of high sugar (glucose) levels can cause many complications.  RISK FACTORS You have an increased chance of developing gestational diabetes if you have a family history of diabetes and also have one or more of the following risk factors:  A body mass index over 30 (obesity).  A previous pregnancy with gestational diabetes.  An older age at the time of pregnancy. If blood glucose levels are kept in the normal range during pregnancy, women can have a healthy pregnancy. If your blood glucose levels are not well controlled, there may be risks to you, your unborn baby (fetus), your labor and delivery, or your newborn baby.  SYMPTOMS  If symptoms are experienced, they are much like symptoms you would normally expect during pregnancy. The symptoms of gestational diabetes include:   Increased thirst (polydipsia).  Increased urination (polyuria).  Increased urination during the night (nocturia).  Weight loss. This weight loss may be rapid.  Frequent, recurring infections.  Tiredness (fatigue).  Weakness.  Vision changes, such as blurred vision.  Fruity smell to your breath.  Abdominal pain. DIAGNOSIS Diabetes is diagnosed when blood glucose levels are increased. Your blood glucose level may be checked by one or more of the following blood  tests:  A fasting blood glucose test. You will not be allowed to eat for at least 8 hours before a blood sample is taken.  A random blood glucose test. Your blood glucose is checked at any time of the day regardless of when you ate.  A hemoglobin A1c blood glucose test. A hemoglobin A1c test provides information about blood glucose control over the previous 3 months.  An oral glucose tolerance test (OGTT). Your blood glucose is measured after you have not eaten (fasted) for 1-3 hours and then after you drink a glucose-containing beverage. Since the hormones that cause insulin resistance are highest at about 24-28 weeks of a pregnancy, an OGTT is usually performed during that time. If you have risk factors for gestational diabetes, your healthcare provider may test you for gestational diabetes earlier than 24 weeks of pregnancy. TREATMENT   You will need to take diabetes medicine or insulin daily to keep blood glucose levels in the desired range.  You will need to match insulin dosing with exercise and healthy food choices. The treatment goal is to maintain the before meal (preprandial), bedtime, and overnight blood glucose level at 60-99 mg/dL during pregnancy. The treatment goal is to further maintain peak after meal blood sugar (postprandial glucose) level at 100-140 mg/dL. HOME CARE INSTRUCTIONS   Have your hemoglobin A1c level checked twice a year.  Perform daily blood glucose monitoring as directed by your healthcare provider. It is common to perform frequent blood glucose monitoring.  Monitor urine ketones when you are ill and as directed by your healthcare provider.  Take your diabetes medicine and insulin as directed by your healthcare provider to maintain   your blood glucose level in the desired range.  Never run out of diabetes medicine or insulin. It is needed every day.  Adjust insulin based on your intake of carbohydrates. Carbohydrates can raise blood glucose levels but need  to be included in your diet. Carbohydrates provide vitamins, minerals, and fiber which are an essential part of a healthy diet. Carbohydrates are found in fruits, vegetables, whole grains, dairy products, legumes, and foods containing added sugars.  Eat healthy foods. Alternate 3 meals with 3 snacks.  Maintain a healthy weight gain. The usual total expected weight gain varies according to your prepregnancy body mass index (BMI).  Carry a medical alert card or wear your medical alert jewelry.  Carry a 15 gram carbohydrate snack with you at all times to treat low blood glucose (hypoglycemia). Some examples of 15 gram carbohydrate snacks include:  Glucose tablets, 3 or 4  Glucose gel, 15 gram tube  Raisins, 2 tablespoons (24 g)  Jelly beans, 6  Animal crackers, 8  Fruit juice, regular soda, or low fat milk, 4 ounces (120 mL)  Gummy treats, 9  Recognize hypoglycemia. Hypoglycemia during pregnancy occurs with blood glucose levels of 60 mg/dL and below. The risk for hypoglycemia increases when fasting or skipping meals, during or after intense exercise, and during sleep. Hypoglycemia symptoms can include:  Tremors or shakes.  Decreased ability to concentrate.  Sweating.  Increased heart rate.  Headache.  Dry mouth.  Hunger.  Irritability.  Anxiety.  Restless sleep.  Altered speech or coordination.  Confusion.  Treat hypoglycemia promptly. If you are alert and able to safely swallow, follow the 15:15 rule:  Take 15-20 grams of rapid-acting glucose or carbohydrate. Rapid-acting options include glucose gel, glucose tablets, or 4 ounces (120 mL) of fruit juice, regular soda, or low fat milk.  Check your blood glucose level 15 minutes after taking the glucose.  Take 15-20 grams more of glucose if the repeat blood glucose level is still 70 mg/dL or below.  Eat a meal or snack within 1 hour once blood glucose levels return to normal.  Be alert to polyuria (excess  urination) and polydipsia (excess thirst) which are early signs of hyperglycemia. An early awareness of hyperglycemia allows for prompt treatment. Treat hyperglycemia as directed by your healthcare provider.  Engage in at least 30 minutes of physical activity a day or as directed by your healthcare provider. Ten minutes of physical activity timed 30 minutes after each meal is encouraged to control postprandial blood glucose levels.  Adjust your insulin dosing and food intake as needed if you start a new exercise or sport.  Follow your sick day plan at any time you are unable to eat or drink as usual.  Avoid tobacco and alcohol use.  Follow up with your healthcare provider regularly.  Follow the advice of your healthcare provider regarding your prenatal and post-delivery (postpartum) appointments, meal planning, exercise, medicines, vitamins, blood tests, other medical tests, and physical activities.  Perform daily skin and foot care. Examine your skin and feet daily for cuts, bruises, redness, nail problems, bleeding, blisters, or sores.  Brush your teeth and gums at least twice a day and floss at least once a day. Follow up with your dentist regularly.  Schedule an eye exam during the first trimester of your pregnancy or as directed by your healthcare provider.  Share your diabetes management plan with your workplace or school.  Stay up-to-date with immunizations.  Learn to manage stress.  Obtain ongoing diabetes education   and support as needed.  Learn about and consider breastfeeding your baby.  You should have your blood sugar level checked 6-12 weeks after delivery. This is done with an oral glucose tolerance test (OGTT). SEEK MEDICAL CARE IF:   You are unable to eat food or drink fluids for more than 6 hours.  You have nausea and vomiting for more than 6 hours.  You have a blood glucose level of 200 mg/dL and you have ketones in your urine.  There is a change in mental  status.  You develop vision problems.  You have a persistent headache.  You have upper abdominal pain or discomfort.  You develop an additional serious illness.  You have diarrhea for more than 6 hours.  You have been sick or have had a fever for a couple of days and are not getting better. SEEK IMMEDIATE MEDICAL CARE IF:   You have difficulty breathing.  You no longer feel the baby moving.  You are bleeding or have discharge from your vagina.  You start having premature contractions or labor. MAKE SURE YOU:  Understand these instructions.  Will watch your condition.  Will get help right away if you are not doing well or get worse. Document Released: 12/09/2000 Document Revised: 09/07/2013 Document Reviewed: 03/31/2012 ExitCare Patient Information 2015 [employer]. This information is not intended to replace advice given to you by your health care provider. Make sure you discuss any questions you have with your health care provider.  

## 2014-04-01 ENCOUNTER — Ambulatory Visit (INDEPENDENT_AMBULATORY_CARE_PROVIDER_SITE_OTHER): Payer: Medicaid Other | Admitting: *Deleted

## 2014-04-01 VITALS — BP 127/71 | HR 88

## 2014-04-01 DIAGNOSIS — O99810 Abnormal glucose complicating pregnancy: Secondary | ICD-10-CM

## 2014-04-03 NOTE — Progress Notes (Signed)
NST  Reactive on 04/01/14

## 2014-04-04 ENCOUNTER — Other Ambulatory Visit: Payer: Self-pay | Admitting: General Practice

## 2014-04-04 ENCOUNTER — Ambulatory Visit (HOSPITAL_COMMUNITY)
Admission: RE | Admit: 2014-04-04 | Discharge: 2014-04-04 | Disposition: A | Discharge status: Home / Self Care | Payer: Medicaid Other | Source: Ambulatory Visit | Attending: Obstetrics & Gynecology | Admitting: Obstetrics & Gynecology

## 2014-04-04 ENCOUNTER — Ambulatory Visit (INDEPENDENT_AMBULATORY_CARE_PROVIDER_SITE_OTHER): Payer: Medicaid Other | Admitting: Obstetrics & Gynecology

## 2014-04-04 VITALS — BP 112/60 | HR 80 | Temp 98.2°F | Wt 155.6 lb

## 2014-04-04 DIAGNOSIS — O99810 Abnormal glucose complicating pregnancy: Secondary | ICD-10-CM

## 2014-04-04 DIAGNOSIS — O0930 Supervision of pregnancy with insufficient antenatal care, unspecified trimester: Secondary | ICD-10-CM

## 2014-04-04 DIAGNOSIS — O0931 Supervision of pregnancy with insufficient antenatal care, first trimester: Secondary | ICD-10-CM

## 2014-04-04 DIAGNOSIS — Z3689 Encounter for other specified antenatal screening: Secondary | ICD-10-CM | POA: Diagnosis not present

## 2014-04-04 LAB — POCT URINALYSIS DIP (DEVICE)
Bilirubin Urine: NEGATIVE
GLUCOSE, UA: NEGATIVE mg/dL
Ketones, ur: NEGATIVE mg/dL
NITRITE: NEGATIVE
PROTEIN: 30 mg/dL — AB
SPECIFIC GRAVITY, URINE: 1.015 (ref 1.005–1.030)
UROBILINOGEN UA: 0.2 mg/dL (ref 0.0–1.0)
pH: 7 (ref 5.0–8.0)

## 2014-04-04 MED ORDER — GLYBURIDE 5 MG PO TABS: ORAL_TABLET | ORAL | Status: DC

## 2014-04-04 NOTE — Progress Notes (Signed)
Fastings 102/89/112/126/78/100/87.  2 hr PP B and L are within range. 2 hr PP D 131/101/167/172/173/189. Increased Glyburide to 5 mg po qam, 7.5 mg po qpm; will reevaluate next week Hypoglycemia precautions advised NST performed today was reviewed and was found to be reactive.  Continue recommended antenatal testing and prenatal care. U/S today showed AFI 17.58 cm, EFW 57% (preliminary read) No other complaints or concerns.  Fetal movement and labor precautions reviewed.

## 2014-04-04 NOTE — Progress Notes (Signed)
Pt has a question concerning her glyburide.

## 2014-04-04 NOTE — Patient Instructions (Signed)
Return to clinic for any obstetric concerns or go to MAU for evaluation  

## 2014-04-06 ENCOUNTER — Inpatient Hospital Stay (HOSPITAL_COMMUNITY): Payer: Medicaid Other

## 2014-04-06 ENCOUNTER — Inpatient Hospital Stay (HOSPITAL_COMMUNITY)
Admission: AD | Admit: 2014-04-06 | Discharge: 2014-04-08 | DRG: 775 | Disposition: A | Discharge status: Home / Self Care | Payer: Medicaid Other | Source: Ambulatory Visit | Attending: Obstetrics & Gynecology | Admitting: Obstetrics & Gynecology

## 2014-04-06 ENCOUNTER — Encounter (HOSPITAL_COMMUNITY): Payer: Self-pay | Admitting: *Deleted

## 2014-04-06 DIAGNOSIS — O42919 Preterm premature rupture of membranes, unspecified as to length of time between rupture and onset of labor, unspecified trimester: Secondary | ICD-10-CM | POA: Diagnosis present

## 2014-04-06 DIAGNOSIS — O4290 Premature rupture of membranes, unspecified as to length of time between rupture and onset of labor, unspecified weeks of gestation: Secondary | ICD-10-CM | POA: Diagnosis present

## 2014-04-06 DIAGNOSIS — Z87891 Personal history of nicotine dependence: Secondary | ICD-10-CM | POA: Diagnosis not present

## 2014-04-06 DIAGNOSIS — Z833 Family history of diabetes mellitus: Secondary | ICD-10-CM

## 2014-04-06 DIAGNOSIS — O99814 Abnormal glucose complicating childbirth: Secondary | ICD-10-CM | POA: Diagnosis not present

## 2014-04-06 DIAGNOSIS — O42913 Preterm premature rupture of membranes, unspecified as to length of time between rupture and onset of labor, third trimester: Secondary | ICD-10-CM

## 2014-04-06 DIAGNOSIS — O0930 Supervision of pregnancy with insufficient antenatal care, unspecified trimester: Secondary | ICD-10-CM

## 2014-04-06 HISTORY — DX: Gestational diabetes mellitus in pregnancy, unspecified control: O24.419

## 2014-04-06 LAB — TYPE AND SCREEN
ABO/RH(D): B POS
Antibody Screen: NEGATIVE

## 2014-04-06 LAB — CBC
HCT: 35.9 % — ABNORMAL LOW (ref 36.0–46.0)
Hemoglobin: 11.4 g/dL — ABNORMAL LOW (ref 12.0–15.0)
MCH: 22.9 pg — ABNORMAL LOW (ref 26.0–34.0)
MCHC: 31.8 g/dL (ref 30.0–36.0)
MCV: 72.2 fL — AB (ref 78.0–100.0)
PLATELETS: 374 10*3/uL (ref 150–400)
RBC: 4.97 MIL/uL (ref 3.87–5.11)
RDW: 13.7 % (ref 11.5–15.5)
WBC: 11.6 10*3/uL — AB (ref 4.0–10.5)

## 2014-04-06 LAB — GROUP B STREP BY PCR: Group B strep by PCR: NEGATIVE

## 2014-04-06 LAB — GLUCOSE, CAPILLARY
Glucose-Capillary: 148 mg/dL — ABNORMAL HIGH (ref 70–99)
Glucose-Capillary: 144 mg/dL — ABNORMAL HIGH (ref 70–99)

## 2014-04-06 LAB — RPR

## 2014-04-06 LAB — HIV ANTIBODY (ROUTINE TESTING W REFLEX): HIV 1&2 Ab, 4th Generation: NONREACTIVE

## 2014-04-06 MED ORDER — WITCH HAZEL-GLYCERIN EX PADS: 1.0000 "application " | MEDICATED_PAD | CUTANEOUS | Status: DC | PRN

## 2014-04-06 MED ORDER — LACTATED RINGERS IV SOLN
INTRAVENOUS | Status: DC
Start: 2014-04-06 — End: 2014-04-06

## 2014-04-06 MED ORDER — GLYBURIDE 5 MG PO TABS: 7.5000 mg | ORAL_TABLET | Freq: Every day | ORAL | Status: DC

## 2014-04-06 MED ORDER — ONDANSETRON HCL 4 MG/2ML IJ SOLN: 4.0000 mg | Freq: Four times a day (QID) | INTRAMUSCULAR | Status: DC | PRN

## 2014-04-06 MED ORDER — IBUPROFEN 600 MG PO TABS
600.0000 mg | ORAL_TABLET | Freq: Four times a day (QID) | ORAL | Status: DC
  Administered 2014-04-07 – 2014-04-08 (×7): 600 mg via ORAL
  Filled 2014-04-06 (×7): qty 1

## 2014-04-06 MED ORDER — CITRIC ACID-SODIUM CITRATE 334-500 MG/5ML PO SOLN: 30.0000 mL | ORAL | Status: DC | PRN

## 2014-04-06 MED ORDER — OXYTOCIN BOLUS FROM INFUSION: 500.0000 mL | INTRAVENOUS | Status: DC

## 2014-04-06 MED ORDER — LANOLIN HYDROUS EX OINT: 1.0000 "application " | TOPICAL_OINTMENT | CUTANEOUS | Status: DC | PRN

## 2014-04-06 MED ORDER — FERROUS SULFATE 325 (65 FE) MG PO TABS
325.0000 mg | ORAL_TABLET | Freq: Two times a day (BID) | ORAL | Status: DC
  Administered 2014-04-06 – 2014-04-08 (×4): 325 mg via ORAL
  Filled 2014-04-06 (×4): qty 1

## 2014-04-06 MED ORDER — ONDANSETRON HCL 4 MG PO TABS: 4.0000 mg | ORAL_TABLET | ORAL | Status: DC | PRN

## 2014-04-06 MED ORDER — DIBUCAINE 1 % RE OINT: 1.0000 "application " | TOPICAL_OINTMENT | RECTAL | Status: DC | PRN

## 2014-04-06 MED ORDER — BETAMETHASONE SOD PHOS & ACET 6 (3-3) MG/ML IJ SUSP
12.0000 mg | Freq: Once | INTRAMUSCULAR | Status: DC
  Filled 2014-04-06: qty 2

## 2014-04-06 MED ORDER — OXYCODONE-ACETAMINOPHEN 5-325 MG PO TABS
1.0000 | ORAL_TABLET | ORAL | Status: DC | PRN
  Administered 2014-04-06 – 2014-04-08 (×3): 1 via ORAL
  Filled 2014-04-06 (×3): qty 1

## 2014-04-06 MED ORDER — DIPHENHYDRAMINE HCL 50 MG/ML IJ SOLN: 12.5000 mg | INTRAMUSCULAR | Status: DC | PRN

## 2014-04-06 MED ORDER — PHENYLEPHRINE 40 MCG/ML (10ML) SYRINGE FOR IV PUSH (FOR BLOOD PRESSURE SUPPORT)
80.0000 ug | PREFILLED_SYRINGE | INTRAVENOUS | Status: DC | PRN
  Filled 2014-04-06: qty 2

## 2014-04-06 MED ORDER — BUTORPHANOL TARTRATE 1 MG/ML IJ SOLN
1.0000 mg | INTRAMUSCULAR | Status: DC | PRN
  Administered 2014-04-06 (×3): 1 mg via INTRAVENOUS
  Filled 2014-04-06 (×3): qty 1

## 2014-04-06 MED ORDER — OXYTOCIN 40 UNITS IN LACTATED RINGERS INFUSION - SIMPLE MED
62.5000 mL/h | INTRAVENOUS | Status: DC
  Administered 2014-04-06: 999 mL/h via INTRAVENOUS
  Filled 2014-04-06: qty 1000

## 2014-04-06 MED ORDER — PENICILLIN G POTASSIUM 5000000 UNITS IJ SOLR
2.5000 10*6.[IU] | INTRAVENOUS | Status: DC
  Filled 2014-04-06 (×2): qty 2.5

## 2014-04-06 MED ORDER — OXYCODONE-ACETAMINOPHEN 5-325 MG PO TABS
1.0000 | ORAL_TABLET | ORAL | Status: DC | PRN
Start: 2014-04-06 — End: 2014-04-06

## 2014-04-06 MED ORDER — GLYBURIDE 5 MG PO TABS
5.0000 mg | ORAL_TABLET | Freq: Every day | ORAL | Status: DC
  Administered 2014-04-06: 5 mg via ORAL
  Filled 2014-04-06: qty 1

## 2014-04-06 MED ORDER — LIDOCAINE HCL (PF) 1 % IJ SOLN
INTRAMUSCULAR | Status: AC
  Administered 2014-04-06: 30 mL
  Filled 2014-04-06: qty 30

## 2014-04-06 MED ORDER — SIMETHICONE 80 MG PO CHEW: 80.0000 mg | CHEWABLE_TABLET | ORAL | Status: DC | PRN

## 2014-04-06 MED ORDER — CALCIUM CARBONATE ANTACID 500 MG PO CHEW: 2.0000 | CHEWABLE_TABLET | ORAL | Status: DC | PRN

## 2014-04-06 MED ORDER — LACTATED RINGERS IV SOLN
INTRAVENOUS | Status: DC
  Administered 2014-04-06: 10:00:00 via INTRAVENOUS

## 2014-04-06 MED ORDER — MAGNESIUM HYDROXIDE 400 MG/5ML PO SUSP: 30.0000 mL | ORAL | Status: DC | PRN

## 2014-04-06 MED ORDER — EPHEDRINE 5 MG/ML INJ
10.0000 mg | INTRAVENOUS | Status: DC | PRN
  Filled 2014-04-06: qty 2

## 2014-04-06 MED ORDER — BETAMETHASONE SOD PHOS & ACET 6 (3-3) MG/ML IJ SUSP
12.0000 mg | Freq: Once | INTRAMUSCULAR | Status: AC
  Administered 2014-04-06: 12 mg via INTRAMUSCULAR
  Filled 2014-04-06: qty 2

## 2014-04-06 MED ORDER — LACTATED RINGERS IV SOLN: 500.0000 mL | Freq: Once | INTRAVENOUS | Status: DC

## 2014-04-06 MED ORDER — ZOLPIDEM TARTRATE 5 MG PO TABS: 5.0000 mg | ORAL_TABLET | Freq: Every evening | ORAL | Status: DC | PRN

## 2014-04-06 MED ORDER — FLEET ENEMA 7-19 GM/118ML RE ENEM: 1.0000 | ENEMA | Freq: Every day | RECTAL | Status: DC | PRN

## 2014-04-06 MED ORDER — MEASLES, MUMPS & RUBELLA VAC ~~LOC~~ INJ
0.5000 mL | INJECTION | Freq: Once | SUBCUTANEOUS | Status: AC
  Administered 2014-04-08: 0.5 mL via SUBCUTANEOUS
  Filled 2014-04-06 (×2): qty 0.5

## 2014-04-06 MED ORDER — SENNOSIDES-DOCUSATE SODIUM 8.6-50 MG PO TABS
2.0000 | ORAL_TABLET | ORAL | Status: DC
  Administered 2014-04-07 (×2): 2 via ORAL
  Filled 2014-04-06 (×2): qty 2

## 2014-04-06 MED ORDER — INSULIN ASPART 100 UNIT/ML ~~LOC~~ SOLN
0.0000 [IU] | SUBCUTANEOUS | Status: DC
  Administered 2014-04-06: 3 [IU] via SUBCUTANEOUS

## 2014-04-06 MED ORDER — IBUPROFEN 600 MG PO TABS
600.0000 mg | ORAL_TABLET | Freq: Four times a day (QID) | ORAL | Status: DC | PRN
Start: 2014-04-06 — End: 2014-04-06
  Administered 2014-04-06: 600 mg via ORAL
  Filled 2014-04-06: qty 1

## 2014-04-06 MED ORDER — ONDANSETRON HCL 4 MG/2ML IJ SOLN: 4.0000 mg | INTRAMUSCULAR | Status: DC | PRN

## 2014-04-06 MED ORDER — ACETAMINOPHEN 325 MG PO TABS: 650.0000 mg | ORAL_TABLET | ORAL | Status: DC | PRN

## 2014-04-06 MED ORDER — DOCUSATE SODIUM 100 MG PO CAPS
100.0000 mg | ORAL_CAPSULE | Freq: Every day | ORAL | Status: DC
Start: 2014-04-06 — End: 2014-04-06
  Filled 2014-04-06: qty 1

## 2014-04-06 MED ORDER — BENZOCAINE-MENTHOL 20-0.5 % EX AERO
1.0000 "application " | INHALATION_SPRAY | CUTANEOUS | Status: DC | PRN
  Administered 2014-04-06 – 2014-04-07 (×2): 1 via TOPICAL
  Filled 2014-04-06 (×2): qty 56

## 2014-04-06 MED ORDER — PRENATAL MULTIVITAMIN CH
1.0000 | ORAL_TABLET | Freq: Every day | ORAL | Status: DC
  Filled 2014-04-06: qty 1

## 2014-04-06 MED ORDER — FENTANYL 2.5 MCG/ML BUPIVACAINE 1/10 % EPIDURAL INFUSION (WH - ANES): 14.0000 mL/h | INTRAMUSCULAR | Status: DC | PRN

## 2014-04-06 MED ORDER — LACTATED RINGERS IV SOLN: 500.0000 mL | INTRAVENOUS | Status: DC | PRN

## 2014-04-06 MED ORDER — BETAMETHASONE SOD PHOS & ACET 6 (3-3) MG/ML IJ SUSP: 12.0000 mg | INTRAMUSCULAR | Status: DC

## 2014-04-06 MED ORDER — DEXTROSE 5 % IV SOLN
5.0000 10*6.[IU] | Freq: Once | INTRAVENOUS | Status: DC
  Filled 2014-04-06: qty 5

## 2014-04-06 MED ORDER — TETANUS-DIPHTH-ACELL PERTUSSIS 5-2.5-18.5 LF-MCG/0.5 IM SUSP: 0.5000 mL | Freq: Once | INTRAMUSCULAR | Status: DC

## 2014-04-06 MED ORDER — PRENATAL MULTIVITAMIN CH
1.0000 | ORAL_TABLET | Freq: Every day | ORAL | Status: DC
  Administered 2014-04-07 – 2014-04-08 (×2): 1 via ORAL
  Filled 2014-04-06 (×2): qty 1

## 2014-04-06 MED ORDER — DIPHENHYDRAMINE HCL 25 MG PO CAPS: 25.0000 mg | ORAL_CAPSULE | Freq: Four times a day (QID) | ORAL | Status: DC | PRN

## 2014-04-06 NOTE — H&P (Signed)
Connie White is a 18 y.o. 802P0010  female at 5256w6d wks IUP presenting for preterm premature rupture of membranes at 0100 today.  Pt [facility] weeks [facility]  Pregnancy is dated by 26 wk ultrasound and is complicated by poorly controlled gestational diabetes managed by glyburide.    Maternal Medical History:  Reason for admission: Rupture of membranes and contractions.   Contractions: Onset was 3-5 hours ago.   Frequency: regular.    Fetal activity: Perceived fetal activity is normal.   Last perceived fetal movement was within the past hour.    Prenatal complications: Gestational diabetes  Prenatal Complications - Diabetes: gestational. Diabetes is managed by oral agent (monotherapy).      OB History   Grav Para Term Preterm Abortions TAB SAB Ect Mult Living   2 0 0 0 1 0 1 0 0 0      Past Medical History  Diagnosis Date  . Medical history non-contributory   . Gestational diabetes    Past Surgical History  Procedure Laterality Date  . No past surgeries     Family History: family history includes Diabetes in her mother. Social History:  reports that she has quit smoking. She has never used smokeless tobacco. She reports that she does not drink alcohol or use illicit drugs. Review of Systems  Constitutional: Negative for fever.  Gastrointestinal: Positive for abdominal pain (contractions).  Genitourinary:       Rupture of membranes  All other systems reviewed and are negative.     Height 4\' 7"  (1.397 m), weight 72.122 kg (159 lb), last menstrual period 07/26/2013, unknown if currently breastfeeding. Maternal Exam:  Uterine Assessment: Contraction strength is moderate.  Contraction duration is 50 seconds. Contraction frequency is regular.   Abdomen: Estimated fetal weight is 5-5.5 lbs.    Introitus: Vaginal discharge: clear discharge.  Ferning test: positive.  Amniotic fluid character: clear.     Fetal Exam Fetal Monitor Review:  Baseline rate: 130's.  Variability: moderate (6-25 bpm).   Pattern: accelerations present.    Fetal State Assessment: Category I - tracings are normal.     Physical Exam  Constitutional: She is oriented to person, place, and time. She appears well-developed and well-nourished. No distress.  HENT:  Head: Normocephalic.  Neck: Normal range of motion. Neck supple.  Cardiovascular: Normal rate, regular rhythm and normal heart sounds.   Respiratory: Effort normal and breath sounds normal.  GI: Soft. There is no tenderness.  Genitourinary: No bleeding around the vagina. Vaginal discharge: clear discharge.  Musculoskeletal: Normal range of motion. She exhibits no edema.  Neurological: She is alert and oriented to person, place, and time.  Skin: Skin is warm and dry.    Prenatal labs: ABO, Rh: B/POS/-- (05/27 1516) Antibody: NEG (05/27 1516) Rubella: 0.69 (05/27 1516) RPR: NON REAC (05/27 1516)  HBsAg: NEGATIVE (05/27 1516)  HIV: NONREACTIVE (05/27 1516)  GBS:     Assessment/Plan: 18 yo G2P0010 at 4256w6d wks IUP Gestational Diabetes - A2 Preterm Premature Rupture of Membranes  #Admit to Antenatal #Betamethasone 12 mg IM now, repeat in 24 hours #GBS pcr stat; GC/CT and wet prep #Ultrasound for fetal presentation #PCN IV for GBS prophylaxis #Consulted with Dr. Vonda Antiguaove>expectant management since 34 weeks tomorrow   Connie White, Connie White 04/06/2014, 4:23 AM

## 2014-04-06 NOTE — H&P (Signed)
Attestation of Attending Supervision of Obstetric Fellow: Evaluation and management procedures were performed by the Obstetric Fellow under my supervision and collaboration.  I have reviewed the Obstetric Fellow's note and chart, and I agree with the management and plan.  Darald Uzzle, MD, FACOG Attending Obstetrician & Gynecologist Faculty Practice, [facility]   

## 2014-04-06 NOTE — MAU Note (Signed)
Pt states she feels like her water broke [id number].Marland Kitchen. Pt states she having contractions prior to rupture of membranes. Pt contractions irregularly at this time

## 2014-04-06 NOTE — H&P (Signed)
History and Physical Note  Assessment/Plan:  Patient Active Problem List   Diagnosis Date Noted  . Preterm premature rupture of membranes (PPROM) with unknown onset of labor 04/06/2014  . Gestational diabetes mellitus, antepartum 03/10/2014  . Late prenatal care complicating pregnancy 02/09/2014  . ANEMIA, OTHER, UNSPECIFIED 11/13/2006  . HEADACHE, UNSPECIFIED 11/13/2006    This is a 18 y.o. old G2P0010 at [redacted]w[redacted]d here for PPROM and now in active labor # Fetal Assessement: Cat I, reassuring. Has received BMX x1 at 0400. NICU to be at delivery # Labor plan: Expectant management # Plans for coping with Labor: Considering epidural # Plans for infant feeding: plans to breastfeed # Plans for post partum contraception: Unsure, need to ask # GBS: negative  The plan was discussed with the attending.  HPI:   Connie White is a 18 y.o. old here for PPROM. Pt reports preterm premature rupture of membranes at 0100 on 7/22. Pt began prenatal care at 28 weeks at [facility]. Pregnancy is dated by 26 wk ultrasound and is complicated by poorly controlled gestational diabetes managed by glyburide.   Chief Complaint  Patient presents with  . Rupture of Membranes    PCP: [facility]  Prenatal course: - began prenatal care at 28 weeks at [facility].  - Pregnancy is dated by 26 wk ultrasound - poorly controlled gestational diabetes managed by glyburide.    Contractions started:  0600 Leaking fluid: yes  Vaginal bleeding: no Fetal Movement: yes Accompanied by: FOB  Allergies  Review of patient's allergies indicates no known allergies.  Medications  Prior to Admission medications   Medication Sig Start Date End Date Taking? Authorizing Provider  ACCU-CHEK FASTCLIX LANCETS MISC Inject 1 each into the skin 4 (four) times daily. 161.09 for testing 4 times daily 03/07/14   Adam Phenix, MD  acetaminophen (TYLENOL) 325 MG tablet Take 650 mg by mouth every  6 (six) hours as needed.    Historical Provider, MD  glucose blood (ACCU-CHEK SMARTVIEW) test strip Check CBG 4 times daily: fasting and 2 hours after each meal 03/10/14   Peggy Constant, MD  glucose blood test strip Use as instructed 03/07/14   Adam Phenix, MD  glyBURIDE (DIABETA) 5 MG tablet Take 1 tablet in the morning and 1.5 tablets at night 04/04/14   Ugonna A Anyanwu, MD  Prenatal Vit-Fe Fumarate-FA (MULTIVITAMIN-PRENATAL) 27-0.8 MG TABS Take 1 tablet by mouth daily at 12 noon.    Historical Provider, MD  ranitidine (ZANTAC) 150 MG tablet Take 1 tablet (150 mg total) by mouth 2 (two) times daily. 02/25/14   Hurshel Party, CNM    OB History  OB History   Grav Para Term Preterm Abortions TAB SAB Ect Mult Living   2 0 0 0 1 0 1 0 0 0       Past Medical History  Past Medical History  Diagnosis Date  . Medical history non-contributory   . Gestational diabetes     Past Surgical History  Past Surgical History  Procedure Laterality Date  . No past surgeries      Past Social History  History   Social History  . Marital Status: Single    Spouse Name: N/A    Number of Children: N/A  . Years of Education: N/A   Social History Main Topics  . Smoking status: Former Games developer  . Smokeless tobacco: Never Used  . Alcohol Use: No  . Drug Use: No  . Sexual  Activity: Yes    Birth Control/ Protection: None   Other Topics Concern  . None   Social History Narrative  . None    Family History  family history includes Diabetes in her mother.  Review of Systems  Per HPI  Objective Blood pressure 124/64, pulse 83, temperature 98.5 F (36.9 C), temperature source Oral, resp. rate 16, height 4\' 7"  (1.397 m), weight 159 lb (72.122 kg), last menstrual period 07/26/2013, unknown if currently breastfeeding.  General Appearance No acute distress, well appearing and well nourished.  Eyes Pupils equal and round. Extraocular muscles intact and sclera clear.  ENT Well  hydrated moist mucous membranes  Neck Supple without any enlargements, no thyromegaly, or JVD.  Pulmonary Clear to auscultation bilaterally, without wheezes/crackles/rhonchi. Good air movement.  Cardiovascular Pulse normal rate, regularity and rhythm. S1 and S2 normal, No MRG  Abdomen Normoactive bowel sounds, abdomen soft, gravid, no fundal tenderness, no rebound or guarding.  Genitourinary normal external genitalia, vulva, vagina, cervix, uterus and adnexa  SVE Dilation: 4, Effacement (%): 100, Station: -1  Extremities No rash, lesions or petechiae. No bilateral cyanosis, clubbing.  Edema none  Skin Skin color, texture, turgor normal, no rash/lesions/breakdown  Neurologic Alert and oriented to person, place, and time. Cranial nerves II-XII grossly intact, normal gait. DTR 2+  Psychiatric Mood and affect within normal limits   FHR Monitoring Baseline Rate (A): 140 bpm   Accelerations: 15 x 15  Position: VTX, confirmed by SVE  Lab Results  Component Value Date   ABORH B POS 04/06/2014   LABRPR NON REAC 02/09/2014   GLUCOSE1HR 140 02/09/2014   GLUCOSE3HR 147* 02/28/2014   HGB 11.4* 04/06/2014   HCT 35.9* 04/06/2014

## 2014-04-06 NOTE — Progress Notes (Signed)
NICU charge nurse updated on sve and imminent delivery.

## 2014-04-07 LAB — GLUCOSE, CAPILLARY: Glucose-Capillary: 114 mg/dL — ABNORMAL HIGH (ref 70–99)

## 2014-04-07 LAB — GC/CHLAMYDIA PROBE AMP
CT Probe RNA: NEGATIVE
GC Probe RNA: NEGATIVE

## 2014-04-07 NOTE — Progress Notes (Signed)
Post Partum Day #1 Subjective: no complaints, up ad lib and tolerating PO; pumping for NICU infant; undecided re contraception  Objective: Blood pressure 94/49, pulse 100, temperature 98.3 F (36.8 C), temperature source Oral, resp. rate 18, height 4\' 7"  (1.397 m), weight 72.122 kg (159 lb), last menstrual period 07/26/2013, SpO2 100.00%, unknown if currently breastfeeding.  Physical Exam:  General: alert, cooperative and no distress Lochia: appropriate Uterine Fundus: firm DVT Evaluation: No evidence of DVT seen on physical exam.   Recent Labs  04/06/14 0310  HGB 11.4*  HCT 35.9*   FBS: 114  Assessment/Plan: Plan for discharge tomorrow [facility]   LOS: 1 day   Cam HaiSHAW, KIMBERLY CNM 04/07/2014, 7:40 AM

## 2014-04-07 NOTE — Progress Notes (Signed)
UR chart review completed.  

## 2014-04-08 ENCOUNTER — Other Ambulatory Visit: Payer: Medicaid Other

## 2014-04-08 ENCOUNTER — Ambulatory Visit: Payer: Self-pay

## 2014-04-08 MED ORDER — IBUPROFEN 600 MG PO TABS: 600.0000 mg | ORAL_TABLET | Freq: Four times a day (QID) | ORAL | Status: DC

## 2014-04-08 NOTE — Discharge Summary (Signed)
Obstetric Discharge Summary Reason for Admission: PPROM Prenatal Procedures: none Intrapartum Procedures: spontaneous vaginal delivery Postpartum Procedures: none Complications-Operative and Postpartum: none Hemoglobin  Date Value Ref Range Status  04/06/2014 11.4* 12.0 - 15.0 g/dL Final     HCT  Date Value Ref Range Status  04/06/2014 35.9* 36.0 - 46.0 % Final   18 year old G2P0111 who presented at 5243w6d with PPROM and active preterm labor. Patient's pregnancy was complicated by poorly controlled Gestational diabetes on glyburide. After arrival to L&D she had steady progression of labor. She delivered a preterm infant vaginally without any other complications. Infant was taken to the NICU. Patient had routine recovery after delivery. Fasting blood glucose was 114 on the day after delivery. Patient was discharged home and glyburide was discontinued. We discussed options for contraception. She does not want to be pregnant again for at least 5 years. She was undecided regarding her method of contraception and was advised to use barrier method [facility] if [facility] chose to be sexually [facility]. Vitals and hgb were normal at discharge.  Physical Exam:  General: alert, cooperative and no distress Lochia: appropriate Uterine Fundus: firm Incision: n/a DVT Evaluation: No evidence of DVT seen on physical exam. Negative Homan's sign. No cords or calf tenderness.  Discharge Diagnoses: Premature labor and Preterm premature rupture of membranes, Preterm delivery, gestational diabetes  Discharge Information: Date: 04/08/2014 Activity: pelvic rest Diet: routine Medications: Ibuprofen Condition: stable Instructions: contact physician for any worsening pain, bleeding, nausea, fever Discharge to: home Follow-up Information   Follow up with LEGGETT,KELLY H., MD In 6 weeks.   Specialty:  Obstetrics and Gynecology   Contact information:   [address] [place]  [id number] [phone]       Newborn Data: Live born female  Birth Weight: 5 lb 15.2 oz (2700 g) APGAR: 4, 6  Home with mother.  Patient evaluated with Francene FindersKimberly Malyn Aytes Stephens, Devin A 04/08/2014, 7:44 AM  I have seen and examined this patient and I agree with the above. Cam HaiSHAW, Victorhugo Preis CNM 8:48 AM 04/14/2014

## 2014-04-08 NOTE — Lactation Note (Signed)
This note was copied from the chart of Boy Alvena Spurling. Lactation Consultation Note   follow up consult with this mom of a NICU baby, now 3153 hours old, and 34 1/7 weeks CGA. Mom was doing skin to skin with baby. i attempted latching him, but he was too sleepy. I was able to hand express drops of colostrum into his mouth. Mom is doing well with pumping, and expressing about 15 mls. I advised mom to pump 15-30 minutes now, until she stops drip[ping, and to add hand expression after each pumping. i loaned mom a DEP. I also explained to mom how her baby being a LPT infant effects breast feeding, and that he will be transitioned to full breast feeding, as he gets closer to term.   Patient Name: Boy Lawson FiscalHmaneca Ryall WNUUV'OToday's Date: 04/08/2014 Reason for consult: Follow-up assessment;NICU baby;Late preterm infant   Maternal Data    Feeding Feeding Type: Breast Milk Length of feed: 25 min  LATCH Score/Interventions Latch: Too sleepy or reluctant, no latch achieved, no sucking elicited. Intervention(s): Skin to skin  Audible Swallowing: None  Type of Nipple: Everted at rest and after stimulation  Comfort (Breast/Nipple): Soft / non-tender     Hold (Positioning): Assistance needed to correctly position infant at breast and maintain latch. Intervention(s): Breastfeeding basics reviewed;Support Pillows;Position options;Skin to skin  LATCH Score: 5  Lactation Tools Discussed/Used Tools: Pump Breast pump type: Double-Electric Breast Pump WIC Program: Yes (appointment on 7/27) Pump Review: Setup, frequency, and cleaning   Consult Status Consult Status: PRN Follow-up type: In-patient (NICU)    Alfred LevinsLee, Tae Vonada Anne 04/08/2014, 7:42 PM

## 2014-04-08 NOTE — Progress Notes (Signed)
Pt discharged home with significant other... Discharge instructions reviewed with pt and she verbalized understanding... Condition stable... No equipment... Ambulated to car with E. Lanayah Gartley, RN.  

## 2014-04-08 NOTE — Discharge Instructions (Signed)
Sinh N? qua Âm ??o, Ch?m Sóc Sau khí Sinh °(Vaginal Delivery, Care After) °Tham kh?o t? thông tin này trong vài tu?n t?i. Nh?ng h??ng d?n xu?t vi?n này cung c?p cho b?n thông tin v? ch?m sóc b?n thân sau khi sinh. Chuyên gia ch?m sóc s?c kh?e c?ng có th? cung c?p cho b?n các h??ng d?n c? th?. ?i?u tr? c?a b?n ?ã ???c lên k? ho?ch theo th?c hành y khoa m?i nh?t s?n có, nh?ng v?n ?? ?ôi khi v?n x?y ra. Hãy g?i cho chuyên gia ch?m sóc s?c kh?e c?a mình n?u b?n có b?t k? v?n ?? ho?c câu h?i nào sau khi v? nhà. °H??NG D?N CH?M SÓC T?I NHÀ °· Ch? s? d?ng thu?c không c?n kê toa ho?c thu?c c?n kê toa theo ch? d?n c?a chuyên gia ch?m sóc s?c kh?e ho?c d??c s?. °· Không u?ng r??u ??c bi?t khi b?n ?ang nuôi con bú ho?c u?ng thu?c ?? gi?m ?au. °· Không nhai thu?c lá ho?c hút thu?c. °· Không s? d?ng ma túy b?t h?p pháp. °· Ti?p t?c ch?m sóc t?t vùng ?áy ch?u. Ch?m sóc t?t vùng ?áy ch?u bao g?m: °¨ Lau ?áy ch?u c?a b?n t? tr??c ra sau. °¨ Gi? ?áy ch?u c?a b?n s?ch s?. °· Không s? d?ng b?ng v? sinh ho?c th?t r?a âm ??o cho ??n khi chuyên gia ch?m sóc s?c kh?e cho phép. °· T?m, g?i ??u và t?m b?n theo ch? d?n c?a chuyên gia ch?m sóc s?c kh?e. °· M?c áo ng?c v?a v?n h? tr? ng?c. °· ?n th?c ph?m có l?i cho s?c kh?e. °· U?ng ?? n??c ?? gi? cho n??c ti?u trong ho?c vàng nh?t. °· ?n các lo?i th?c ph?m nhi?u ch?t x? nh? ng? c?c nguyên h?t, bánh mì, g?o l?c, các lo?i ??u, trái cây t??i và rau qu? m?i ngày. Nh?ng th?c ph?m này có th? giúp ng?n ng?a ho?c làm gi?m táo bón. °· Làm hi?n theo khuy?n ngh? c?a chuyên gia ch?m sóc s?c kh?e v? vi?c b?t ??u l?i các ho?t ??ng nh? leo c?u thang, lái xe, nâng, t?p th? d?c ho?c ?i l?i. °· Nói chuy?n v?i chuyên gia ch?m sóc s?c kh?e v? vi?c b?t ??u l?i ho?t ??ng tình d?c. Vi?c b?t ??u l?i ho?t ??ng tình d?c tùy thu?c vào nguy c? nhi?m trùng, m?c ?? lành b?nh, c?m giác tho?i mái và mong mu?n ti?p t?c ho?t ??ng tình d?c c?a b?n. °· C? g?ng ?? có ai ?ó giúp b?n các công vi?c n?i tr? và tr? m?i sinh cho ít  nh?t m?t vài ngày sau khi xu?t vi?n. °· Ngh? ng?i càng nhi?u càng t?t. C? g?ng ngh? ng?i ho?c có gi?c ng? ng?n khi tr? m?i sinh ?ang ng?. °· T?ng ho?t ??ng d?n d?n. °· Tuân th? m?i cu?c h?n liên quan ngay sau khi sinh ?ã ???c s?p x?p l?ch. ?i?u r?t quan tr?ng là ph?i gi? m?i cu?c h?n khám l?i ?ã ???c s?p x?p l?ch. T?i các cu?c h?n này, chuyên gia ch?m sóc s?c kh?e s? ki?m tra ?? ??m b?o r?ng b?n ?ang h?i ph?c v? m?t th? ch?t và tình c?m. °HÃY ?I KHÁM N?U: °· Âm ??o cho ra các c?c máu ?ông l?n. Gi? l?i m?i c?c máu ?ông ?? chuyên gia ch?m sóc s?c kh?e xem. °· D?ch âm ??o có mùi hôi. °· B?n g?p v?n ?? khi ti?u ti?n. °· B?n ?i ti?u th??ng xuyên. °· B?n b? ?au khi ?i ti?u. °· Có s? thay ??i trong ??  i ti?n. °· B? t?y ??, ?au ho?c s?ng g?n v?t r?ch âm ??o (c?t t?ng sinh môn) hay v?t rách âm ??o. °· Có m? ch?y ra t? v?t c?t t?ng sinh môn ho?c v?t rách âm ??o. °· V?t c?t t?ng sinh môn ho?c v?t rách âm ??o há mi?ng. °· Ng?c b? ?au, c?ng ho?c ??. °· B?n b? ?au ??u r?t nhi?u. °· M?t b?n b? m? ho?c nhìn th?y các v?t l?m ??m. °· B?n c?m th?y bu?n ho?c chán n?n. °· B?n có suy ngh? v? vi?c t? làm t?n th??ng mình ho?c tr? m?i sinh. °· B?n có câu h?i v? vi?c ch?m sóc b?n thân, ch?m sóc tr? m?i sinh ho?c v? thu?c. °· B?n b? chóng m?t ho?c ?au ??u. °· B?n b? phát ban. °· B?n b? bu?n nôn ho?c nôn m?a. °· B?n ?ã cho con bú và ch?a có kinh nguy?t trong vòng 12 tu?n sau khi ng?ng cho con bú. °· B?n không cho con bú và ch?a có kinh nguy?t trong kho?ng 12 tu?n sau khi sinh. °· B?n b? s?t. °HÃY NGAY L?P T?C ?I KHÁM N?U: °· B?n b? ?au dai d?ng. °· B?n b? ?au ng?c. °· B?n b? khó th?. °· B?n b? ng?t. °· B?n b? ?au chân. °· B?n b? ?au d? dày. °· Ch?y máu âm ??o th?m ??m hai mi?ng dán v? sinh tr? lên trong 1 ti?ng. °??M B?O B?N: °· Hi?u các h??ng d?n này. °· S? theo dõi tình tr?ng c?a mình. °· S? yêu c?u tr? giúp ngay l?p t?c n?u b?n c?m th?y không ?? ho?c tình tr?ng tr?m tr?ng h?n. °Document Released: 09/02/2005 Document Revised:  05/05/2013 °ExitCare® Patient Information ©2015 [employer]. This information is not intended to replace advice given to you by your health care provider. Make sure you discuss any questions you have with your health care provider. ° °

## 2014-04-11 ENCOUNTER — Other Ambulatory Visit: Payer: Medicaid Other

## 2014-04-19 ENCOUNTER — Other Ambulatory Visit: Payer: Self-pay

## 2014-04-22 ENCOUNTER — Encounter: Payer: Self-pay | Admitting: Obstetrics and Gynecology

## 2014-04-22 ENCOUNTER — Ambulatory Visit (INDEPENDENT_AMBULATORY_CARE_PROVIDER_SITE_OTHER): Payer: Medicaid Other | Admitting: Nurse Practitioner

## 2014-04-22 DIAGNOSIS — O99810 Abnormal glucose complicating pregnancy: Secondary | ICD-10-CM

## 2014-04-22 DIAGNOSIS — Z309 Encounter for contraceptive management, unspecified: Secondary | ICD-10-CM | POA: Insufficient documentation

## 2014-04-22 DIAGNOSIS — O24419 Gestational diabetes mellitus in pregnancy, unspecified control: Secondary | ICD-10-CM

## 2014-04-22 NOTE — Patient Instructions (Signed)

## 2014-04-22 NOTE — Progress Notes (Signed)
Patient ID: Connie White, female   DOB: 04/15/1996, 18 y.o.   MRN: [id number] Subjective:     Connie White is a 18 y.o. female who presents for a postpartum visit. She is 2 week postpartum following a spontaneous vaginal delivery. I have fully reviewed the prenatal and intrapartum course. The delivery was at 34 gestational weeks. Outcome: spontaneous vaginal delivery. Anesthesia: IV sedation. Postpartum course has been uneventful. Baby's course has been in NICU for one week now home. Baby is feeding by Similac. Bleeding brown. Bowel function is normal. Bladder function is normal. Patient is not sexually active. Contraception method is condoms. Postpartum depression screening: negative.  The following portions of the patient's history were reviewed and updated as appropriate: current medications, past family history, past medical history, past social history, past surgical history and problem list.  Review of Systems   Objective:    There were no vitals taken for this visit.  General:  alert, cooperative and no distress   Breasts:  Not examined  Lungs: clear to auscultation bilaterally  Heart:  regular rate and rhythm, S1, S2 normal, no murmur, click, rub or gallop  Abdomen: soft, non-tender; bowel sounds normal; no masses,  no organomegaly   Vulva:  normalstitches without any sx infection  Vagina: normal vaginabrown blood  Cervix:  no lesions  Corpus: normal size, contour, position, consistency, mobility, non-tender  Adnexa:  normal adnexa  Rectal Exam: Not performed.        Assessment:       postpartum exam. Pap smear not done at today's visit.   Plan:    1. Contraception: condoms 2. Postpartum Exam Gestational diabetes 3. Follow up in: 4 week or as needed.  Follow up for 2 hour GGT

## 2014-04-22 NOTE — Progress Notes (Signed)
Patient ID: Connie White, female   DOB: 07/20/1996, 18 y.o.   MRN: [id number] Pt needs prescription from NICU for special formula,  Pt is undecided about birthcontrol

## 2014-05-16 ENCOUNTER — Ambulatory Visit: Payer: Medicaid Other | Admitting: Obstetrics & Gynecology

## 2014-07-18 ENCOUNTER — Encounter (HOSPITAL_COMMUNITY): Payer: Self-pay | Admitting: *Deleted

## 2014-08-19 ENCOUNTER — Encounter: Payer: Self-pay | Admitting: Obstetrics & Gynecology

## 2015-03-13 ENCOUNTER — Other Ambulatory Visit: Payer: Self-pay

## 2016-05-22 ENCOUNTER — Inpatient Hospital Stay (HOSPITAL_COMMUNITY): Payer: Medicaid Other

## 2016-05-22 ENCOUNTER — Inpatient Hospital Stay (HOSPITAL_COMMUNITY)
Admission: AD | Admit: 2016-05-22 | Discharge: 2016-05-22 | Disposition: A | Discharge status: Home / Self Care | Payer: Medicaid Other | Source: Ambulatory Visit | Attending: Family Medicine | Admitting: Family Medicine

## 2016-05-22 ENCOUNTER — Encounter (HOSPITAL_COMMUNITY): Payer: Self-pay | Admitting: *Deleted

## 2016-05-22 DIAGNOSIS — O9989 Other specified diseases and conditions complicating pregnancy, childbirth and the puerperium: Secondary | ICD-10-CM | POA: Diagnosis not present

## 2016-05-22 DIAGNOSIS — O26891 Other specified pregnancy related conditions, first trimester: Secondary | ICD-10-CM | POA: Insufficient documentation

## 2016-05-22 DIAGNOSIS — R1030 Lower abdominal pain, unspecified: Secondary | ICD-10-CM | POA: Diagnosis not present

## 2016-05-22 DIAGNOSIS — Z87891 Personal history of nicotine dependence: Secondary | ICD-10-CM | POA: Insufficient documentation

## 2016-05-22 DIAGNOSIS — O26899 Other specified pregnancy related conditions, unspecified trimester: Secondary | ICD-10-CM

## 2016-05-22 DIAGNOSIS — M25511 Pain in right shoulder: Secondary | ICD-10-CM | POA: Insufficient documentation

## 2016-05-22 DIAGNOSIS — O26892 Other specified pregnancy related conditions, second trimester: Secondary | ICD-10-CM | POA: Diagnosis present

## 2016-05-22 DIAGNOSIS — R109 Unspecified abdominal pain: Secondary | ICD-10-CM | POA: Diagnosis present

## 2016-05-22 LAB — URINALYSIS, ROUTINE W REFLEX MICROSCOPIC
Bilirubin Urine: NEGATIVE
Glucose, UA: NEGATIVE mg/dL
Hgb urine dipstick: NEGATIVE
Ketones, ur: NEGATIVE mg/dL
NITRITE: NEGATIVE
PH: 7 (ref 5.0–8.0)
Protein, ur: NEGATIVE mg/dL
SPECIFIC GRAVITY, URINE: 1.015 (ref 1.005–1.030)

## 2016-05-22 LAB — CBC
HCT: 35.4 % — ABNORMAL LOW (ref 36.0–46.0)
Hemoglobin: 11.4 g/dL — ABNORMAL LOW (ref 12.0–15.0)
MCH: 22.6 pg — ABNORMAL LOW (ref 26.0–34.0)
MCHC: 32.2 g/dL (ref 30.0–36.0)
MCV: 70.1 fL — AB (ref 78.0–100.0)
Platelets: 382 10*3/uL (ref 150–400)
RBC: 5.05 MIL/uL (ref 3.87–5.11)
RDW: 14.2 % (ref 11.5–15.5)
WBC: 11 10*3/uL — AB (ref 4.0–10.5)

## 2016-05-22 LAB — URINE MICROSCOPIC-ADD ON

## 2016-05-22 LAB — HIV ANTIBODY (ROUTINE TESTING W REFLEX): HIV Screen 4th Generation wRfx: NONREACTIVE

## 2016-05-22 LAB — WET PREP, GENITAL
CLUE CELLS WET PREP: NONE SEEN
Sperm: NONE SEEN
TRICH WET PREP: NONE SEEN
Yeast Wet Prep HPF POC: NONE SEEN

## 2016-05-22 LAB — HCG, QUANTITATIVE, PREGNANCY: hCG, Beta Chain, Quant, S: 35804 m[IU]/mL — ABNORMAL HIGH (ref <5)

## 2016-05-22 LAB — POCT PREGNANCY, URINE: Preg Test, Ur: POSITIVE — AB

## 2016-05-22 NOTE — MAU Note (Signed)
HAD UPT-  AT   HD-   POSITIVE  ON 8-30

## 2016-05-22 NOTE — MAU Provider Note (Signed)
Chief Complaint: Abdominal Pain   First Provider Initiated Contact with Patient 05/22/16 0118     SUBJECTIVE HPI: Ronnette HilaHmaneca Y Adney is a 20 y.o. [id number] at 7136w4d who presents to [facility] Admissions reporting low abdominal pain and right posterior shoulder pain 1 week. Positive home pregnancy test. No other testing this pregnancy.  Location: Across entire low abdomen and right side. Right posterior shoulder Quality: Sharp Severity: 2/10 on pain scale now. 5/10 at worst. Duration: One week Course: Abdominal pain worse tonight but has improved spontaneously Context: None Timing: Intermittent Modifying factors: Worse with movement. Hasn't tried anything else for the pain. Associated signs and symptoms: Negative for fever, chills, vaginal discharge, vaginal bleeding, urinary complaints, vomiting, diarrhea, constipation. Positive for nausea.  Blood type B+.  Past Medical History:  Diagnosis Date  . Gestational diabetes    OB History  Gravida Para Term Preterm AB Living  3 1 0 1 1 1   SAB TAB Ectopic Multiple Live Births  1 0 0 0 1    # Outcome Date GA Lbr Len/2nd Weight Sex Delivery Anes PTL Lv  3 Current           2 Preterm 04/06/14 189w6d 05:13 / 00:13 5 lb 15.2 oz (2.7 kg) M Vag-Spont None  LIV  1 SAB 2014        DEC     Past Surgical History:  Procedure Laterality Date  . NO PAST SURGERIES     Social History   Social History  . Marital status: Single    Spouse name: N/A  . Number of children: N/A  . Years of education: N/A   Occupational History  . Not on file.   Social History Main Topics  . Smoking status: Former Games developermoker  . Smokeless tobacco: Never Used  . Alcohol use No  . Drug use: No  . Sexual activity: Yes    Birth control/ protection: None   Other Topics Concern  . Not on file   Social History Narrative  . No narrative on file   No current facility-administered medications on file prior to encounter.    Current Outpatient Prescriptions on File Prior to  Encounter  Medication Sig Dispense Refill  . Prenatal Vit-Fe Fumarate-FA (MULTIVITAMIN-PRENATAL) 27-0.8 MG TABS Take 1 tablet by mouth daily at 12 noon.    Marland Kitchen. ibuprofen (ADVIL,MOTRIN) 600 MG tablet Take 1 tablet (600 mg total) by mouth every 6 (six) hours. 30 tablet 0  . ranitidine (ZANTAC) 150 MG tablet Take 1 tablet (150 mg total) by mouth 2 (two) times daily. 60 tablet 0   No Known Allergies  I have reviewed the past Medical Hx, Surgical Hx, Social Hx, Allergies and Medications.   Review of Systems  OBJECTIVE Patient Vitals for the past 24 hrs:  BP Temp Temp src Pulse Resp Height Weight  05/22/16 0056 (!) 108/52 98.6 F (37 C) Oral 65 20 - -  05/22/16 0051 - - - - - 4\' 7"  (1.397 m) 148 lb 12 oz (67.5 kg)   Constitutional: Well-developed, well-nourished female in no acute distress.  Cardiovascular: normal rate Respiratory: normal rate and effort.  GI: Abd soft, non-tender. Pos BS x 4 MS: Extremities nontender, no edema, normal ROM Neurologic: Alert and oriented x 4.  GU: Neg CVAT.  SPECULUM EXAM: NEFG, physiologic discharge, no blood noted, cervix clean  BIMANUAL: cervix Closed; uterus top normal size, no adnexal tenderness or masses.  No CMT.  LAB RESULTS Results for orders placed or performed during the  hospital encounter of 05/22/16 (from the past 24 hour(s))  Urinalysis, Routine w reflex microscopic (not at [facility])     Status: Abnormal   Collection Time: 05/22/16 12:58 AM  Result Value Ref Range   Color, Urine YELLOW YELLOW   APPearance CLEAR CLEAR   Specific Gravity, Urine 1.015 1.005 - 1.030   pH 7.0 5.0 - 8.0   Glucose, UA NEGATIVE NEGATIVE mg/dL   Hgb urine dipstick NEGATIVE NEGATIVE   Bilirubin Urine NEGATIVE NEGATIVE   Ketones, ur NEGATIVE NEGATIVE mg/dL   Protein, ur NEGATIVE NEGATIVE mg/dL   Nitrite NEGATIVE NEGATIVE   Leukocytes, UA TRACE (A) NEGATIVE  Urine microscopic-add on     Status: Abnormal   Collection Time: 05/22/16 12:58 AM  Result Value Ref Range    Squamous Epithelial / LPF 0-5 (A) NONE SEEN   WBC, UA 0-5 0 - 5 WBC/hpf   RBC / HPF 0-5 0 - 5 RBC/hpf   Bacteria, UA FEW (A) NONE SEEN   Urine-Other AMORPHOUS URATES/PHOSPHATES   Pregnancy, urine POC     Status: Abnormal   Collection Time: 05/22/16  1:04 AM  Result Value Ref Range   Preg Test, Ur POSITIVE (A) NEGATIVE  hCG, quantitative, pregnancy     Status: Abnormal   Collection Time: 05/22/16  1:24 AM  Result Value Ref Range   hCG, Beta Chain, Quant, S 35,804 (H) <5 mIU/mL  CBC     Status: Abnormal   Collection Time: 05/22/16  1:26 AM  Result Value Ref Range   WBC 11.0 (H) 4.0 - 10.5 K/uL   RBC 5.05 3.87 - 5.11 MIL/uL   Hemoglobin 11.4 (L) 12.0 - 15.0 g/dL   HCT 40.9 (L) 81.1 - 91.4 %   MCV 70.1 (L) 78.0 - 100.0 fL   MCH 22.6 (L) 26.0 - 34.0 pg   MCHC 32.2 30.0 - 36.0 g/dL   RDW 78.2 95.6 - 21.3 %   Platelets 382 150 - 400 K/uL  Wet prep, genital     Status: Abnormal   Collection Time: 05/22/16  3:05 AM  Result Value Ref Range   Yeast Wet Prep HPF POC NONE SEEN NONE SEEN   Trich, Wet Prep NONE SEEN NONE SEEN   Clue Cells Wet Prep HPF POC NONE SEEN NONE SEEN   WBC, Wet Prep HPF POC FEW (A) NONE SEEN   Sperm NONE SEEN     IMAGING US Ob Comp Less 14 Wks  Result Date: 05/22/2016 CLINICAL DATA:  Acute onset of lower abdominal pain. Initial encounter. EXAM: OBSTETRIC <14 WK [place] AND TRANSVAGINAL OB US TECHNIQUE: Both transabdominal and transvaginal ultrasound examinations were performed for complete evaluation of the gestation as well as the maternal uterus, adnexal regions, and pelvic cul-de-sac. Transvaginal technique was performed to assess early pregnancy. COMPARISON:  Pelvic ultrasound performed 04/06/2014 FINDINGS: Intrauterine gestational sac: Single; visualized and normal in shape. Yolk sac:  Yes Embryo:  Yes Cardiac Activity: Yes Heart Rate: 114  bpm CRL:  4.1  mm   6 w   1 d                  [place] EDC: 01/14/2017 Subchorionic hemorrhage:  None visualized. Maternal  uterus/adnexae: The uterus is otherwise unremarkable. The ovaries are within normal limits. The right ovary measures 2.0 x 1.9 x 1.8 cm, while the left ovary measures 1.8 x 1.3 x 1.3 cm. No suspicious adnexal masses are seen; there is no evidence for ovarian torsion. No free fluid is seen  within the pelvic cul-de-sac. IMPRESSION: Single live intrauterine pregnancy noted, with a crown-rump length of 4 mm, corresponding to a gestational age of [redacted] weeks 1 day. This does not match the gestational age by LMP, and reflects a new estimated date of delivery of Jan 14, 2017. Electronically Signed   By: Roanna Raider M.D.   On: 05/22/2016 02:07   US Ob Transvaginal  Result Date: 05/22/2016 CLINICAL DATA:  Acute onset of lower abdominal pain. Initial encounter. EXAM: OBSTETRIC <14 WK [place] AND TRANSVAGINAL OB US TECHNIQUE: Both transabdominal and transvaginal ultrasound examinations were performed for complete evaluation of the gestation as well as the maternal uterus, adnexal regions, and pelvic cul-de-sac. Transvaginal technique was performed to assess early pregnancy. COMPARISON:  Pelvic ultrasound performed 04/06/2014 FINDINGS: Intrauterine gestational sac: Single; visualized and normal in shape. Yolk sac:  Yes Embryo:  Yes Cardiac Activity: Yes Heart Rate: 114  bpm CRL:  4.1  mm   6 w   1 d                  [place] EDC: 01/14/2017 Subchorionic hemorrhage:  None visualized. Maternal uterus/adnexae: The uterus is otherwise unremarkable. The ovaries are within normal limits. The right ovary measures 2.0 x 1.9 x 1.8 cm, while the left ovary measures 1.8 x 1.3 x 1.3 cm. No suspicious adnexal masses are seen; there is no evidence for ovarian torsion. No free fluid is seen within the pelvic cul-de-sac. IMPRESSION: Single live intrauterine pregnancy noted, with a crown-rump length of 4 mm, corresponding to a gestational age of [redacted] weeks 1 day. This does not match the gestational age by LMP, and reflects a new estimated date of delivery  of Jan 14, 2017. Electronically Signed   By: Roanna Raider M.D.   On: 05/22/2016 02:07    MAU COURSE CBC, Quant, ultrasound, wet prep and GC/chlamydia culture, UA  MDM Pain in early pregnancy with normal intrauterine pregnancy and hemodynamically stable. No clear etiology for the pain, but no evidence of emergent condition. Patient is non-toxic-appearing and appropriate for discharge.  ASSESSMENT 1. Abdominal pain affecting pregnancy, antepartum   2. Abdominal pain affecting pregnancy     PLAN Discharge home in stable condition. First trimester precautions Comfort measures. Tylenol when necessary. GC/Chlamydia cultures pending. Follow-up Information    HARPER,CHARLES A, MD Follow up on 06/12/2016.   Specialty:  Obstetrics and Gynecology Why:  New OB appointment Contact information: [address] [place] [address] [phone]        [facility] ADMISSIONS .   Why:  As needed in emergencies Contact information: [address] [id number] mc [place] [address] [phone]           Medication List    STOP taking these medications   ibuprofen 600 MG tablet Commonly known as:  ADVIL,MOTRIN   ranitidine 150 MG tablet Commonly known as:  ZANTAC     TAKE these medications   multivitamin-prenatal 27-0.8 MG Tabs tablet Take 1 tablet by mouth daily at 12 noon.        [place], CNM 05/22/2016  3:47 AM  4

## 2016-05-22 NOTE — Discharge Instructions (Signed)

## 2016-05-22 NOTE — MAU Note (Signed)
PT  SAYS  X1 WEEK    SHE HAS HAD  PAIN  LOWER ABD  AND   RIGHT  ABD.      RIGHT  SHOULDER PAIN STARTED  YESTERDAY-  TODAY WORSE.     LAST SEX-    AUG.

## 2016-05-23 LAB — GC/CHLAMYDIA PROBE AMP (~~LOC~~) NOT AT ARMC
Chlamydia: NEGATIVE
NEISSERIA GONORRHEA: NEGATIVE

## 2016-06-12 ENCOUNTER — Ambulatory Visit (INDEPENDENT_AMBULATORY_CARE_PROVIDER_SITE_OTHER): Payer: Medicaid Other | Admitting: Obstetrics

## 2016-06-12 ENCOUNTER — Encounter: Payer: Self-pay | Admitting: Obstetrics

## 2016-06-12 VITALS — BP 130/87 | HR 106

## 2016-06-12 DIAGNOSIS — Z331 Pregnant state, incidental: Secondary | ICD-10-CM

## 2016-06-12 DIAGNOSIS — Z1389 Encounter for screening for other disorder: Secondary | ICD-10-CM

## 2016-06-12 DIAGNOSIS — Z3491 Encounter for supervision of normal pregnancy, unspecified, first trimester: Secondary | ICD-10-CM | POA: Diagnosis not present

## 2016-06-12 LAB — POCT URINALYSIS DIPSTICK
Bilirubin, UA: NEGATIVE
GLUCOSE UA: NEGATIVE
Ketones, UA: NEGATIVE
NITRITE UA: NEGATIVE
Protein, UA: NEGATIVE
RBC UA: NEGATIVE
Spec Grav, UA: <=1.005
UROBILINOGEN UA: NEGATIVE
pH, UA: 7.5

## 2016-06-12 MED ORDER — VITAFOL GUMMIES 3.33-0.333-34.8 MG PO CHEW: 3.0000 | CHEWABLE_TABLET | Freq: Every day | ORAL | 11 refills | Status: DC

## 2016-06-12 NOTE — Addendum Note (Signed)
Addended by: Marya LandryFOSTER, Alyan Hartline D on: 06/12/2016 02:43 PM   Modules accepted: Orders

## 2016-06-12 NOTE — Progress Notes (Signed)
Subjective:    Connie White is being seen today for her first obstetrical visit.  This is not a planned pregnancy. She is at 7555w1d gestation. Her obstetrical history is significant for previous preterm delivery at 33 weeks after PPROM.Marland Kitchen. Relationship with FOB: significant other, living together. Patient does intend to breast feed. Pregnancy history fully reviewed.  The information documented in the HPI was reviewed and verified.  Menstrual History: OB History    Gravida Para Term Preterm AB Living   3 1 0 1 1 1    SAB TAB Ectopic Multiple Live Births   1 0 0 0 1      Menarche age: [age] Patient's last menstrual period was 03/30/2016.    Past Medical History:  Diagnosis Date  . Gestational diabetes     Past Surgical History:  Procedure Laterality Date  . NO PAST SURGERIES       (Not in a hospital admission) No Known Allergies  Social History  Substance Use Topics  . Smoking status: Former Games developermoker  . Smokeless tobacco: Never Used  . Alcohol use No    Family History  Problem Relation Age of Onset  . Diabetes Mother      Review of Systems Constitutional: negative for weight loss Gastrointestinal: negative for vomiting Genitourinary:negative for genital lesions and vaginal discharge and dysuria Musculoskeletal:negative for back pain Behavioral/Psych: negative for abusive relationship, depression, illegal drug usage and tobacco use    Objective:    BP 130/87   Pulse (!) 106   LMP 03/30/2016  General Appearance:    Alert, cooperative, no distress, appears stated age  Head:    Normocephalic, without obvious abnormality, atraumatic  Eyes:    PERRL, conjunctiva/corneas clear, EOM's intact, fundi    benign, both eyes  Ears:    Normal TM's and external ear canals, both ears  Nose:   Nares normal, septum midline, mucosa normal, no drainage    or sinus tenderness  Throat:   Lips, mucosa, and tongue normal; teeth and gums normal  Neck:   Supple, symmetrical, trachea midline, no  adenopathy;    thyroid:  no enlargement/tenderness/nodules; no carotid   bruit or JVD  Back:     Symmetric, no curvature, ROM normal, no CVA tenderness  Lungs:     Clear to auscultation bilaterally, respirations unlabored  Chest Wall:    No tenderness or deformity   Heart:    Regular rate and rhythm, S1 and S2 normal, no murmur, rub   or gallop  Breast Exam:    No tenderness, masses, or nipple abnormality  Abdomen:     Soft, non-tender, bowel sounds active all four quadrants,    no masses, no organomegaly  Genitalia:    Normal female without lesion, discharge or tenderness  Extremities:   Extremities normal, atraumatic, no cyanosis or edema  Pulses:   2+ and symmetric all extremities  Skin:   Skin color, texture, turgor normal, no rashes or lesions  Lymph nodes:   Cervical, supraclavicular, and axillary nodes normal  Neurologic:   CNII-XII intact, normal strength, sensation and reflexes    throughout      Lab Review Urine pregnancy test Labs reviewed yes Radiologic studies reviewed yes  Assessment:    Pregnancy at 7155w1d weeks   Previous preterm delivery at 33 weeks after PPROM  Plan:      Prenatal vitamins.  Counseling provided regarding continued use of seat belts, cessation of alcohol consumption, smoking or use of illicit drugs; infection precautions i.e.,  influenza/TDAP immunizations, toxoplasmosis,CMV, parvovirus, listeria and varicella; workplace safety, exercise during pregnancy; routine dental care, safe medications, sexual activity, hot tubs, saunas, pools, travel, caffeine use, fish and methlymercury, potential toxins, hair treatments, varicose veins Weight gain recommendations per IOM guidelines reviewed: underweight/BMI< 18.5--> gain 28 - 40 lbs; normal weight/BMI 18.5 - 24.9--> gain 25 - 35 lbs; overweight/BMI 25 - 29.9--> gain 15 - 25 lbs; obese/BMI >30->gain  11 - 20 lbs Problem list reviewed and updated. FIRST/CF mutation testing/NIPT/QUAD SCREEN/fragile  X/Ashkenazi Jewish population testing/Spinal muscular atrophy discussed: requested. Role of ultrasound in pregnancy discussed; fetal survey: requested. Amniocentesis discussed: not indicated. VBAC calculator score: VBAC consent form provided No orders of the defined types were placed in this encounter.  No orders of the defined types were placed in this encounter.   Follow up in 4 weeks. 50% of 20 min visit spent on counseling and coordination of care. Patient ID: Connie White, female   DOB: [birth year], 20 y.o.   MRN: [id number]

## 2016-06-14 LAB — CULTURE, OB URINE

## 2016-06-14 LAB — URINE CULTURE, OB REFLEX

## 2016-06-15 LAB — PRENATAL PROFILE I(LABCORP)
ANTIBODY SCREEN: NEGATIVE
BASOS: 0 %
Basophils Absolute: 0 10*3/uL (ref 0.0–0.2)
EOS (ABSOLUTE): 0.3 10*3/uL (ref 0.0–0.4)
Eos: 3 %
Hematocrit: 35.4 % (ref 34.0–46.6)
Hemoglobin: 11.2 g/dL (ref 11.1–15.9)
Hepatitis B Surface Ag: NEGATIVE
IMMATURE GRANS (ABS): 0 10*3/uL (ref 0.0–0.1)
Immature Granulocytes: 0 %
LYMPHS: 19 %
Lymphocytes Absolute: 2.2 10*3/uL (ref 0.7–3.1)
MCH: 22.9 pg — AB (ref 26.6–33.0)
MCHC: 31.6 g/dL (ref 31.5–35.7)
MCV: 72 fL — ABNORMAL LOW (ref 79–97)
MONOCYTES: 5 %
MONOS ABS: 0.6 10*3/uL (ref 0.1–0.9)
NEUTROS ABS: 8.1 10*3/uL — AB (ref 1.4–7.0)
Neutrophils: 73 %
Platelets: 433 10*3/uL — ABNORMAL HIGH (ref 150–379)
RBC: 4.9 x10E6/uL (ref 3.77–5.28)
RDW: 13.9 % (ref 12.3–15.4)
RPR Ser Ql: NONREACTIVE
RUBELLA: 1.15 {index} (ref >0.99)
Rh Factor: POSITIVE
WBC: 11.3 10*3/uL — AB (ref 3.4–10.8)

## 2016-06-15 LAB — HEMOGLOBINOPATHY EVALUATION
HEMOGLOBIN A2 QUANTITATION: 1.7 % (ref 0.7–3.1)
HGB A: 98.3 % — AB (ref 94.0–98.0)
HGB C: 0 %
HGB S: 0 %
Hemoglobin F Quantitation: 0 % (ref 0.0–2.0)

## 2016-06-15 LAB — VITAMIN D 25 HYDROXY (VIT D DEFICIENCY, FRACTURES): VIT D 25 HYDROXY: 16.1 ng/mL — AB (ref 30.0–100.0)

## 2016-06-15 LAB — VARICELLA ZOSTER ANTIBODY, IGG

## 2016-06-17 LAB — PAP IG W/ RFLX HPV ASCU: PAP Smear Comment: 0

## 2016-06-20 LAB — TOXASSURE SELECT 13 (MW), URINE

## 2016-06-27 ENCOUNTER — Telehealth: Payer: Self-pay

## 2016-06-28 NOTE — Telephone Encounter (Signed)
Attempted to contact on 06-27-16, no answer

## 2016-07-10 ENCOUNTER — Ambulatory Visit (INDEPENDENT_AMBULATORY_CARE_PROVIDER_SITE_OTHER): Payer: Medicaid Other | Admitting: Obstetrics and Gynecology

## 2016-07-10 DIAGNOSIS — O09899 Supervision of other high risk pregnancies, unspecified trimester: Secondary | ICD-10-CM

## 2016-07-10 DIAGNOSIS — O0993 Supervision of high risk pregnancy, unspecified, third trimester: Secondary | ICD-10-CM | POA: Insufficient documentation

## 2016-07-10 DIAGNOSIS — O09211 Supervision of pregnancy with history of pre-term labor, first trimester: Secondary | ICD-10-CM | POA: Diagnosis not present

## 2016-07-10 DIAGNOSIS — Z3480 Encounter for supervision of other normal pregnancy, unspecified trimester: Secondary | ICD-10-CM

## 2016-07-10 DIAGNOSIS — O09219 Supervision of pregnancy with history of pre-term labor, unspecified trimester: Secondary | ICD-10-CM

## 2016-07-10 NOTE — Addendum Note (Signed)
Addended by: Hermina StaggersERVIN, Misao Fackrell L on: 07/10/2016 09:51 AM   Modules accepted: Orders

## 2016-07-10 NOTE — Progress Notes (Signed)
Patient is in the office and states that she feels good so far in the pregnancy.

## 2016-07-10 NOTE — Progress Notes (Signed)
Subjective:  Connie White is a 20 y.o. [id number] at 882w1d being seen today for ongoing prenatal care.  She is currently monitored for the following issues for this high-risk pregnancy and has ANEMIA, OTHER, UNSPECIFIED; Gestational diabetes mellitus, antepartum; Supervision of normal pregnancy, antepartum; and H/O preterm delivery, currently pregnant on her problem list.  Patient reports no complaints.  Contractions: Not present. Vag. Bleeding: None.   . Denies leaking of fluid.   The following portions of the patient's history were reviewed and updated as appropriate: allergies, current medications, past family history, past medical history, past social history, past surgical history and problem list. Problem list updated.  Objective:   Vitals:   07/10/16 0909  BP: 105/69  Pulse: 91  Temp: 99.2 F (37.3 C)  Weight: 146 lb 9.6 oz (66.5 kg)    Fetal Status: Fetal Heart Rate (bpm): 159         General:  Alert, oriented and cooperative. Patient is in no acute distress.  Skin: Skin is warm and dry. No rash noted.   Cardiovascular: Normal heart rate noted  Respiratory: Normal respiratory effort, no problems with respiration noted  Abdomen: Soft, gravid, appropriate for gestational age. Pain/Pressure: Absent     Pelvic:  Cervical exam deferred        Extremities: Normal range of motion.  Edema: None  Mental Status: Normal mood and affect. Normal behavior. Normal judgment and thought content.   Urinalysis:      Assessment and Plan:  Pregnancy: [id number] at 9782w1d  1. Supervision of other normal pregnancy, antepartum Declines flu vaccine - US MFM OB COMP + 14 WK; Future  2. H/O preterm delivery, currently pregnant PTL/PTD at 33 weeks following PROM Discuss 17 OHP at next visit  Preterm labor symptoms and general obstetric precautions including but not limited to vaginal bleeding, contractions, leaking of fluid and fetal movement were reviewed in detail with the patient. Please refer to  After Visit Summary for other counseling recommendations.  No Follow-up on file.   Hermina StaggersMichael L Sheriann Newmann, MD

## 2016-07-25 ENCOUNTER — Ambulatory Visit (HOSPITAL_COMMUNITY): Payer: Medicaid Other

## 2016-07-26 ENCOUNTER — Encounter (HOSPITAL_COMMUNITY): Payer: Self-pay | Admitting: Obstetrics and Gynecology

## 2016-08-07 ENCOUNTER — Ambulatory Visit (INDEPENDENT_AMBULATORY_CARE_PROVIDER_SITE_OTHER): Payer: Medicaid Other | Admitting: Obstetrics & Gynecology

## 2016-08-07 VITALS — BP 109/73 | HR 92 | Temp 98.0°F | Wt 150.6 lb

## 2016-08-07 DIAGNOSIS — O09212 Supervision of pregnancy with history of pre-term labor, second trimester: Secondary | ICD-10-CM | POA: Diagnosis not present

## 2016-08-07 DIAGNOSIS — Z3480 Encounter for supervision of other normal pregnancy, unspecified trimester: Secondary | ICD-10-CM

## 2016-08-07 DIAGNOSIS — O09899 Supervision of other high risk pregnancies, unspecified trimester: Secondary | ICD-10-CM

## 2016-08-07 DIAGNOSIS — O09219 Supervision of pregnancy with history of pre-term labor, unspecified trimester: Principal | ICD-10-CM

## 2016-08-07 DIAGNOSIS — Z8632 Personal history of gestational diabetes: Secondary | ICD-10-CM

## 2016-08-07 MED ORDER — HYDROXYPROGESTERONE CAPROATE 250 MG/ML IM OIL
250.0000 mg | TOPICAL_OIL | INTRAMUSCULAR | Status: DC
  Administered 2016-08-07 – 2016-12-12 (×16): 250 mg via INTRAMUSCULAR

## 2016-08-07 NOTE — Patient Instructions (Signed)
Second Trimester of Pregnancy The second trimester is from week 13 through week 28, month 4 through 6. This is often the time in pregnancy that you feel your best. Often times, morning sickness has lessened or quit. You may have more energy, and you may get hungry more often. Your unborn baby (fetus) is growing rapidly. At the end of the sixth month, he or she is about 9 inches long and weighs about 1 pounds. You will likely feel the baby move (quickening) between 18 and 20 weeks of pregnancy. Follow these instructions at home:  Avoid all smoking, herbs, and alcohol. Avoid drugs not approved by your doctor.  Do not use any tobacco products, including cigarettes, chewing tobacco, and electronic cigarettes. If you need help quitting, ask your doctor. You may get counseling or other support to help you quit.  Only take medicine as told by your doctor. Some medicines are safe and some are not during pregnancy.  Exercise only as told by your doctor. Stop exercising if you start having cramps.  Eat regular, healthy meals.  Wear a good support bra if your breasts are tender.  Do not use hot tubs, steam rooms, or saunas.  Wear your seat belt when driving.  Avoid raw meat, uncooked cheese, and liter boxes and soil used by cats.  Take your prenatal vitamins.  Take 1500-2000 milligrams of calcium daily starting at the 20th week of pregnancy until you deliver your baby.  Try taking medicine that helps you poop (stool softener) as needed, and if your doctor approves. Eat more fiber by eating fresh fruit, vegetables, and whole grains. Drink enough fluids to keep your pee (urine) clear or pale yellow.  Take warm water baths (sitz baths) to soothe pain or discomfort caused by hemorrhoids. Use hemorrhoid cream if your doctor approves.  If you have puffy, bulging veins (varicose veins), wear support hose. Raise (elevate) your feet for 15 minutes, 3-4 times a day. Limit salt in your diet.  Avoid heavy  lifting, wear low heals, and sit up straight.  Rest with your legs raised if you have leg cramps or low back pain.  Visit your dentist if you have not gone during your pregnancy. Use a soft toothbrush to brush your teeth. Be gentle when you floss.  You can have sex (intercourse) unless your doctor tells you not to.  Go to your doctor visits. Get help if:  You feel dizzy.  You have mild cramps or pressure in your lower belly (abdomen).  You have a nagging pain in your belly area.  You continue to feel sick to your stomach (nauseous), throw up (vomit), or have watery poop (diarrhea).  You have bad smelling fluid coming from your vagina.  You have pain with peeing (urination). Get help right away if:  You have a fever.  You are leaking fluid from your vagina.  You have spotting or bleeding from your vagina.  You have severe belly cramping or pain.  You lose or gain weight rapidly.  You have trouble catching your breath and have chest pain.  You notice sudden or extreme puffiness (swelling) of your face, hands, ankles, feet, or legs.  You have not felt the baby move in over an hour.  You have severe headaches that do not go away with medicine.  You have vision changes. This information is not intended to replace advice given to you by your health care provider. Make sure you discuss any questions you have with your health care   provider. Document Released: 11/27/2009 Document Revised: 02/08/2016 Document Reviewed: 11/03/2012 Elsevier Interactive Patient Education  2017 Elsevier Inc.  

## 2016-08-07 NOTE — Progress Notes (Signed)
   PRENATAL VISIT NOTE  Subjective:  Connie White is a 20 y.o. [id number] at 7561w1d being seen today for ongoing prenatal care.  She is currently monitored for the following issues for this high-risk pregnancy and has ANEMIA, OTHER, UNSPECIFIED; History of gestational diabetes mellitus; Supervision of normal pregnancy, antepartum; and H/O preterm delivery, currently pregnant on her problem list.  Patient reports heartburn.  Contractions: Not present. Vag. Bleeding: None.   . Denies leaking of fluid.   The following portions of the patient's history were reviewed and updated as appropriate: allergies, current medications, past family history, past medical history, past social history, past surgical history and problem list. Problem list updated.  Objective:   Vitals:   08/07/16 0820  BP: 109/73  Pulse: 92  Temp: 98 F (36.7 C)  Weight: 150 lb 9.6 oz (68.3 kg)    Fetal Status: Fetal Heart Rate (bpm): 150         General:  Alert, oriented and cooperative. Patient is in no acute distress.  Skin: Skin is warm and dry. No rash noted.   Cardiovascular: Normal heart rate noted  Respiratory: Normal respiratory effort, no problems with respiration noted  Abdomen: Soft, gravid, appropriate for gestational age. Pain/Pressure: Absent     Pelvic:  Cervical exam deferred        Extremities: Normal range of motion.  Edema: None  Mental Status: Normal mood and affect. Normal behavior. Normal judgment and thought content.   Assessment and Plan:  Pregnancy: [id number] at 6061w1d  1. Supervision of other normal pregnancy, antepartum Screening offered - AFP, Quad Screen  2. H/O preterm delivery, currently pregnant Start asap - hydroxyprogesterone caproate (MAKENA) 250 mg/mL injection 250 mg; Inject 1 mL (250 mg total) into the muscle once a week.  3. History of gestational diabetes mellitus Needs early 2hr GTT  Preterm labor symptoms and general obstetric precautions including but not limited to  vaginal bleeding, contractions, leaking of fluid and fetal movement were reviewed in detail with the patient. Please refer to After Visit Summary for other counseling recommendations.  Return in about 4 weeks (around 09/04/2016) for need to come back asap for 2 hr, will have weekly 17 P. US at 18 weeks  Adam PhenixJames G Oumou Smead, MD

## 2016-08-07 NOTE — Progress Notes (Signed)
Patient states that she has nausea that comes and goes, along with a lot of heartburn.

## 2016-08-10 LAB — AFP, QUAD SCREEN
DIA MOM VALUE: 0.83
DIA VALUE (EIA): 144.18 pg/mL
DSR (By Age)    1 IN: 1108
DSR (Second Trimester) 1 IN: 10000
GESTATIONAL AGE AFP: 17.1 wk
MATERNAL AGE AT EDD: 22.8 a
MSAFP Mom: 1.15
MSAFP: 43.8 ng/mL
MSHCG Mom: 0.82
MSHCG: 25815 m[IU]/mL
OSB RISK: 7603
T18 (By Age): 1:4318 {titer}
Test Results:: NEGATIVE
UE3 MOM: 2.14
UE3 VALUE: 2.32 ng/mL
Weight: 150 [lb_av]

## 2016-08-13 ENCOUNTER — Ambulatory Visit (HOSPITAL_COMMUNITY)
Admission: RE | Admit: 2016-08-13 | Discharge: 2016-08-13 | Disposition: A | Discharge status: Home / Self Care | Payer: Medicaid Other | Source: Ambulatory Visit | Attending: Obstetrics and Gynecology | Admitting: Obstetrics and Gynecology

## 2016-08-13 ENCOUNTER — Other Ambulatory Visit: Payer: Self-pay | Admitting: Obstetrics and Gynecology

## 2016-08-13 ENCOUNTER — Ambulatory Visit (HOSPITAL_COMMUNITY): Payer: Medicaid Other

## 2016-08-13 ENCOUNTER — Other Ambulatory Visit (HOSPITAL_COMMUNITY): Payer: Self-pay | Admitting: *Deleted

## 2016-08-13 ENCOUNTER — Encounter (HOSPITAL_COMMUNITY): Payer: Self-pay

## 2016-08-13 DIAGNOSIS — O09212 Supervision of pregnancy with history of pre-term labor, second trimester: Secondary | ICD-10-CM | POA: Diagnosis present

## 2016-08-13 DIAGNOSIS — O09219 Supervision of pregnancy with history of pre-term labor, unspecified trimester: Secondary | ICD-10-CM

## 2016-08-13 DIAGNOSIS — Z3480 Encounter for supervision of other normal pregnancy, unspecified trimester: Secondary | ICD-10-CM

## 2016-08-14 ENCOUNTER — Other Ambulatory Visit: Payer: Medicaid Other | Admitting: *Deleted

## 2016-08-14 VITALS — BP 119/71 | HR 81

## 2016-08-14 DIAGNOSIS — O09299 Supervision of pregnancy with other poor reproductive or obstetric history, unspecified trimester: Secondary | ICD-10-CM

## 2016-08-14 DIAGNOSIS — Z8632 Personal history of gestational diabetes: Principal | ICD-10-CM

## 2016-08-15 LAB — GLUCOSE TOLERANCE, 2 HOURS W/ 1HR
GLUCOSE, FASTING: 120 mg/dL — AB (ref 65–91)
Glucose, 1 hour: 279 mg/dL — ABNORMAL HIGH (ref 65–179)
Glucose, 2 hour: 201 mg/dL — ABNORMAL HIGH (ref 65–152)

## 2016-08-20 ENCOUNTER — Ambulatory Visit: Payer: Medicaid Other

## 2016-08-20 ENCOUNTER — Other Ambulatory Visit: Payer: Self-pay | Admitting: Obstetrics & Gynecology

## 2016-08-20 VITALS — BP 116/75 | HR 94

## 2016-08-20 DIAGNOSIS — Z3480 Encounter for supervision of other normal pregnancy, unspecified trimester: Secondary | ICD-10-CM

## 2016-08-20 DIAGNOSIS — Z8751 Personal history of pre-term labor: Secondary | ICD-10-CM

## 2016-08-20 MED ORDER — PANTOPRAZOLE SODIUM 40 MG PO TBEC: 40.0000 mg | DELAYED_RELEASE_TABLET | Freq: Every day | ORAL | 5 refills | Status: DC

## 2016-08-20 NOTE — Progress Notes (Signed)
Patient is in the office for 17p injection, administered and patient tolerated well.

## 2016-08-27 ENCOUNTER — Other Ambulatory Visit: Payer: Self-pay | Admitting: Obstetrics & Gynecology

## 2016-08-27 ENCOUNTER — Other Ambulatory Visit: Payer: Self-pay | Admitting: Obstetrics and Gynecology

## 2016-08-27 ENCOUNTER — Ambulatory Visit: Payer: Medicaid Other

## 2016-08-27 DIAGNOSIS — Z3A20 20 weeks gestation of pregnancy: Secondary | ICD-10-CM

## 2016-08-27 DIAGNOSIS — O99810 Abnormal glucose complicating pregnancy: Secondary | ICD-10-CM

## 2016-08-27 NOTE — Progress Notes (Signed)
Patient is in office for 17p injection, administered and pt tolerated well .Marland Kitchen. Administrations This Visit    hydroxyprogesterone caproate (MAKENA) 250 mg/mL injection 250 mg    Admin Date 08/27/2016 Action Given Dose 250 mg Route Intramuscular Administered By Katrina StackBrittany D Besse Miron, RN

## 2016-08-28 ENCOUNTER — Encounter (HOSPITAL_COMMUNITY): Payer: Self-pay

## 2016-08-28 ENCOUNTER — Ambulatory Visit (HOSPITAL_COMMUNITY)
Admission: RE | Admit: 2016-08-28 | Discharge: 2016-08-28 | Disposition: A | Discharge status: Home / Self Care | Payer: Medicaid Other | Source: Ambulatory Visit | Attending: Obstetrics and Gynecology | Admitting: Obstetrics and Gynecology

## 2016-08-28 ENCOUNTER — Telehealth: Payer: Self-pay

## 2016-08-28 DIAGNOSIS — O09212 Supervision of pregnancy with history of pre-term labor, second trimester: Secondary | ICD-10-CM | POA: Insufficient documentation

## 2016-08-28 DIAGNOSIS — O09219 Supervision of pregnancy with history of pre-term labor, unspecified trimester: Secondary | ICD-10-CM

## 2016-08-28 DIAGNOSIS — Z3A20 20 weeks gestation of pregnancy: Secondary | ICD-10-CM | POA: Insufficient documentation

## 2016-08-28 DIAGNOSIS — O09292 Supervision of pregnancy with other poor reproductive or obstetric history, second trimester: Secondary | ICD-10-CM | POA: Insufficient documentation

## 2016-08-28 NOTE — Telephone Encounter (Signed)
Contacted pharmacy b/c diabetic supplies are not covered by the patient's insurance, spoke with pharmacist to get correct supplies ordered per provider, notified patient.

## 2016-09-04 ENCOUNTER — Ambulatory Visit (INDEPENDENT_AMBULATORY_CARE_PROVIDER_SITE_OTHER): Payer: Medicaid Other | Admitting: Obstetrics & Gynecology

## 2016-09-04 VITALS — BP 116/74 | HR 88 | Wt 154.0 lb

## 2016-09-04 DIAGNOSIS — O09212 Supervision of pregnancy with history of pre-term labor, second trimester: Secondary | ICD-10-CM

## 2016-09-04 DIAGNOSIS — O09899 Supervision of other high risk pregnancies, unspecified trimester: Secondary | ICD-10-CM

## 2016-09-04 DIAGNOSIS — Z3480 Encounter for supervision of other normal pregnancy, unspecified trimester: Secondary | ICD-10-CM

## 2016-09-04 DIAGNOSIS — Z8632 Personal history of gestational diabetes: Secondary | ICD-10-CM

## 2016-09-04 DIAGNOSIS — O09219 Supervision of pregnancy with history of pre-term labor, unspecified trimester: Secondary | ICD-10-CM

## 2016-09-04 NOTE — Progress Notes (Signed)
Patient states she has a lot of pressure- US have been normal per patient.

## 2016-09-04 NOTE — Patient Instructions (Signed)
Second Trimester of Pregnancy The second trimester is from week 13 through week 28 (months 4 through 6). The second trimester is often a time when you feel your best. Your body has also adjusted to being pregnant, and you begin to feel better physically. Usually, morning sickness has lessened or quit completely, you may have more energy, and you may have an increase in appetite. The second trimester is also a time when the fetus is growing rapidly. At the end of the sixth month, the fetus is about 9 inches long and weighs about 1 pounds. You will likely begin to feel the baby move (quickening) between 18 and 20 weeks of the pregnancy. Body changes during your second trimester Your body continues to go through many changes during your second trimester. The changes vary from woman to woman.  Your weight will continue to increase. You will notice your lower abdomen bulging out.  You may begin to get stretch marks on your hips, abdomen, and breasts.  You may develop headaches that can be relieved by medicines. The medicines should be approved by your health care provider.  You may urinate more often because the fetus is pressing on your bladder.  You may develop or continue to have heartburn as a result of your pregnancy.  You may develop constipation because certain hormones are causing the muscles that push waste through your intestines to slow down.  You may develop hemorrhoids or swollen, bulging veins (varicose veins).  You may have back pain. This is caused by:  Weight gain.  Pregnancy hormones that are relaxing the joints in your pelvis.  A shift in weight and the muscles that support your balance.  Your breasts will continue to grow and they will continue to become tender.  Your gums may bleed and may be sensitive to brushing and flossing.  Dark spots or blotches (chloasma, mask of pregnancy) may develop on your face. This will likely fade after the baby is born.  A dark line  from your belly button to the pubic area (linea nigra) may appear. This will likely fade after the baby is born.  You may have changes in your hair. These can include thickening of your hair, rapid growth, and changes in texture. Some women also have hair loss during or after pregnancy, or hair that feels dry or thin. Your hair will most likely return to normal after your baby is born. What to expect at prenatal visits During a routine prenatal visit:  You will be weighed to make sure you and the fetus are growing normally.  Your blood pressure will be taken.  Your abdomen will be measured to track your baby's growth.  The fetal heartbeat will be listened to.  Any test results from the previous visit will be discussed. Your health care provider may ask you:  How you are feeling.  If you are feeling the baby move.  If you have had any abnormal symptoms, such as leaking fluid, bleeding, severe headaches, or abdominal cramping.  If you are using any tobacco products, including cigarettes, chewing tobacco, and electronic cigarettes.  If you have any questions. Other tests that may be performed during your second trimester include:  Blood tests that check for:  Low iron levels (anemia).  Gestational diabetes (between 24 and 28 weeks).  Rh antibodies. This is to check for a protein on red blood cells (Rh factor).  Urine tests to check for infections, diabetes, or protein in the urine.  An ultrasound to   confirm the proper growth and development of the baby.  An amniocentesis to check for possible genetic problems.  Fetal screens for spina bifida and Down syndrome.  HIV (human immunodeficiency virus) testing. Routine prenatal testing includes screening for HIV, unless you choose not to have this test. Follow these instructions at home: Eating and drinking  Continue to eat regular, healthy meals.  Avoid raw meat, uncooked cheese, cat litter boxes, and soil used by cats. These  carry germs that can cause birth defects in the baby.  Take your prenatal vitamins.  Take 1500-2000 mg of calcium daily starting at the 20th week of pregnancy until you deliver your baby.  If you develop constipation:  Take over-the-counter or prescription medicines.  Drink enough fluid to keep your urine clear or pale yellow.  Eat foods that are high in fiber, such as fresh fruits and vegetables, whole grains, and beans.  Limit foods that are high in fat and processed sugars, such as fried and sweet foods. Activity  Exercise only as directed by your health care provider. Experiencing uterine cramps is a good sign to stop exercising.  Avoid heavy lifting, wear low heel shoes, and practice good posture.  Wear your seat belt at all times when driving.  Rest with your legs elevated if you have leg cramps or low back pain.  Wear a good support bra for breast tenderness.  Do not use hot tubs, steam rooms, or saunas. Lifestyle  Avoid all smoking, herbs, alcohol, and unprescribed drugs. These chemicals affect the formation and growth of the baby.  Do not use any products that contain nicotine or tobacco, such as cigarettes and e-cigarettes. If you need help quitting, ask your health care provider.  A sexual relationship may be continued unless your health care provider directs you otherwise. General instructions  Follow your health care provider's instructions regarding medicine use. There are medicines that are either safe or unsafe to take during pregnancy.  Take warm sitz baths to soothe any pain or discomfort caused by hemorrhoids. Use hemorrhoid cream if your health care provider approves.  If you develop varicose veins, wear support hose. Elevate your feet for 15 minutes, 3-4 times a day. Limit salt in your diet.  Visit your dentist if you have not gone yet during your pregnancy. Use a soft toothbrush to brush your teeth and be gentle when you floss.  Keep all follow-up  prenatal visits as told by your health care provider. This is important. Contact a health care provider if:  You have dizziness.  You have mild pelvic cramps, pelvic pressure, or nagging pain in the abdominal area.  You have persistent nausea, vomiting, or diarrhea.  You have a bad smelling vaginal discharge.  You have pain with urination. Get help right away if:  You have a fever.  You are leaking fluid from your vagina.  You have spotting or bleeding from your vagina.  You have severe abdominal cramping or pain.  You have rapid weight gain or weight loss.  You have shortness of breath with chest pain.  You notice sudden or extreme swelling of your face, hands, ankles, feet, or legs.  You have not felt your baby move in over an hour.  You have severe headaches that do not go away with medicine.  You have vision changes. Summary  The second trimester is from week 13 through week 28 (months 4 through 6). It is also a time when the fetus is growing rapidly.  Your body goes   through many changes during pregnancy. The changes vary from woman to woman.  Avoid all smoking, herbs, alcohol, and unprescribed drugs. These chemicals affect the formation and growth your baby.  Do not use any tobacco products, such as cigarettes, chewing tobacco, and e-cigarettes. If you need help quitting, ask your health care provider.  Contact your health care provider if you have any questions. Keep all prenatal visits as told by your health care provider. This is important. This information is not intended to replace advice given to you by your health care provider. Make sure you discuss any questions you have with your health care provider. Document Released: 08/27/2001 Document Revised: 02/08/2016 Document Reviewed: 11/03/2012 Elsevier Interactive Patient Education  2017 Elsevier Inc.  

## 2016-09-04 NOTE — Progress Notes (Signed)
FAILED 2 HR NEEDS dm MGMT    PRENATAL VISIT NOTE  Subjective:  Connie White is a 20 y.o. [id number] at 4614w1d being seen today for ongoing prenatal care.  She is currently monitored for the following issues for this high-risk pregnancy and has ANEMIA, OTHER, UNSPECIFIED; History of gestational diabetes mellitus; Supervision of normal pregnancy, antepartum; and H/O preterm delivery, currently pregnant on her problem list.  Patient reports no complaints.  Contractions: Not present. Vag. Bleeding: None.  Movement: Present. Denies leaking of fluid.   The following portions of the patient's history were reviewed and updated as appropriate: allergies, current medications, past family history, past medical history, past social history, past surgical history and problem list. Problem list updated.  Objective:   Vitals:   09/04/16 0846  BP: 116/74  Pulse: 88  Weight: 154 lb (69.9 kg)    Fetal Status: Fetal Heart Rate (bpm): 147   Movement: Present     General:  Alert, oriented and cooperative. Patient is in no acute distress.  Skin: Skin is warm and dry. No rash noted.   Cardiovascular: Normal heart rate noted  Respiratory: Normal respiratory effort, no problems with respiration noted  Abdomen: Soft, gravid, appropriate for gestational age. Pain/Pressure: Present     Pelvic:  Cervical exam deferred        Extremities: Normal range of motion.  Edema: None  Mental Status: Normal mood and affect. Normal behavior. Normal judgment and thought content.   Assessment and Plan:  Pregnancy: [id number] at 6714w1d  1. Supervision of other normal pregnancy, antepartum Has testing supplies - Referral to [facility]  2. H/O preterm delivery, currently pregnant 17 p  3. History of gestational diabetes mellitus DM mgmt  Preterm labor symptoms and general obstetric precautions including but not limited to vaginal bleeding, contractions, leaking of fluid and fetal movement were reviewed  in detail with the patient. Please refer to After Visit Summary for other counseling recommendations.  Return in about 2 weeks (around 09/18/2016), or 17 p weekly.   Adam PhenixJames G Arnold, MD

## 2016-09-06 ENCOUNTER — Encounter (HOSPITAL_COMMUNITY): Payer: Self-pay | Admitting: *Deleted

## 2016-09-06 ENCOUNTER — Inpatient Hospital Stay (HOSPITAL_COMMUNITY)
Admission: AD | Admit: 2016-09-06 | Discharge: 2016-09-06 | Disposition: A | Discharge status: Home / Self Care | Payer: Medicaid Other | Source: Ambulatory Visit | Attending: Obstetrics and Gynecology | Admitting: Obstetrics and Gynecology

## 2016-09-06 DIAGNOSIS — O24419 Gestational diabetes mellitus in pregnancy, unspecified control: Secondary | ICD-10-CM | POA: Insufficient documentation

## 2016-09-06 DIAGNOSIS — O99612 Diseases of the digestive system complicating pregnancy, second trimester: Secondary | ICD-10-CM | POA: Insufficient documentation

## 2016-09-06 DIAGNOSIS — Z3A21 21 weeks gestation of pregnancy: Secondary | ICD-10-CM | POA: Diagnosis not present

## 2016-09-06 DIAGNOSIS — Z87891 Personal history of nicotine dependence: Secondary | ICD-10-CM | POA: Insufficient documentation

## 2016-09-06 DIAGNOSIS — K529 Noninfective gastroenteritis and colitis, unspecified: Secondary | ICD-10-CM

## 2016-09-06 DIAGNOSIS — K219 Gastro-esophageal reflux disease without esophagitis: Secondary | ICD-10-CM | POA: Diagnosis not present

## 2016-09-06 DIAGNOSIS — R1084 Generalized abdominal pain: Secondary | ICD-10-CM | POA: Diagnosis present

## 2016-09-06 LAB — CBC WITH DIFFERENTIAL/PLATELET
BASOS ABS: 0 10*3/uL (ref 0.0–0.1)
BASOS PCT: 0 %
EOS PCT: 1 %
Eosinophils Absolute: 0.2 10*3/uL (ref 0.0–0.7)
HEMATOCRIT: 34 % — AB (ref 36.0–46.0)
Hemoglobin: 10.9 g/dL — ABNORMAL LOW (ref 12.0–15.0)
LYMPHS PCT: 11 %
Lymphs Abs: 1.7 10*3/uL (ref 0.7–4.0)
MCH: 22.7 pg — AB (ref 26.0–34.0)
MCHC: 32.1 g/dL (ref 30.0–36.0)
MCV: 70.7 fL — AB (ref 78.0–100.0)
MONOS PCT: 2 %
Monocytes Absolute: 0.3 10*3/uL (ref 0.1–1.0)
NEUTROS ABS: 13 10*3/uL — AB (ref 1.7–7.7)
Neutrophils Relative %: 86 %
Platelets: 378 10*3/uL (ref 150–400)
RBC: 4.81 MIL/uL (ref 3.87–5.11)
RDW: 13.8 % (ref 11.5–15.5)
WBC: 15.2 10*3/uL — ABNORMAL HIGH (ref 4.0–10.5)

## 2016-09-06 LAB — COMPREHENSIVE METABOLIC PANEL
ALT: 38 U/L (ref 14–54)
AST: 28 U/L (ref 15–41)
Albumin: 3.5 g/dL (ref 3.5–5.0)
Alkaline Phosphatase: 50 U/L (ref 38–126)
Anion gap: 11 (ref 5–15)
BILIRUBIN TOTAL: 0.9 mg/dL (ref 0.3–1.2)
BUN: 11 mg/dL (ref 6–20)
CO2: 22 mmol/L (ref 22–32)
CREATININE: 0.55 mg/dL (ref 0.44–1.00)
Calcium: 9 mg/dL (ref 8.9–10.3)
Chloride: 101 mmol/L (ref 101–111)
Glucose, Bld: 105 mg/dL — ABNORMAL HIGH (ref 65–99)
POTASSIUM: 3.8 mmol/L (ref 3.5–5.1)
Sodium: 134 mmol/L — ABNORMAL LOW (ref 135–145)
TOTAL PROTEIN: 7.6 g/dL (ref 6.5–8.1)

## 2016-09-06 MED ORDER — LACTATED RINGERS IV BOLUS (SEPSIS)
1000.0000 mL | Freq: Once | INTRAVENOUS | Status: AC
  Administered 2016-09-06: 1000 mL via INTRAVENOUS

## 2016-09-06 MED ORDER — ONDANSETRON HCL 4 MG/2ML IJ SOLN
4.0000 mg | Freq: Once | INTRAMUSCULAR | Status: AC
  Administered 2016-09-06: 4 mg via INTRAVENOUS
  Filled 2016-09-06: qty 2

## 2016-09-06 MED ORDER — ONDANSETRON 4 MG PO TBDP: 4.0000 mg | ORAL_TABLET | Freq: Three times a day (TID) | ORAL | 0 refills | Status: DC | PRN

## 2016-09-06 MED ORDER — FAMOTIDINE IN NACL 20-0.9 MG/50ML-% IV SOLN
20.0000 mg | Freq: Once | INTRAVENOUS | Status: AC
  Administered 2016-09-06: 20 mg via INTRAVENOUS
  Filled 2016-09-06: qty 50

## 2016-09-06 NOTE — Discharge Instructions (Signed)
Food Choices for Gastroesophageal Reflux Disease, Adult When you have gastroesophageal reflux disease (GERD), the foods you eat and your eating habits are very important. Choosing the right foods can help ease your discomfort. What guidelines do I need to follow?  Choose fruits, vegetables, whole grains, and low-fat dairy products.  Choose low-fat meat, fish, and poultry.  Limit fats such as oils, salad dressings, butter, nuts, and avocado.  Keep a food diary. This helps you identify foods that cause symptoms.  Avoid foods that cause symptoms. These may be different for everyone.  Eat small meals often instead of 3 large meals a day.  Eat your meals slowly, in a place where you are relaxed.  Limit fried foods.  Cook foods using methods other than frying.  Avoid drinking alcohol.  Avoid drinking large amounts of liquids with your meals.  Avoid bending over or lying down until 2-3 hours after eating. What foods are not recommended? These are some foods and drinks that may make your symptoms worse: Vegetables  Tomatoes. Tomato juice. Tomato and spaghetti sauce. Chili peppers. Onion and garlic. Horseradish. Fruits  Oranges, grapefruit, and lemon (fruit and juice). Meats  High-fat meats, fish, and poultry. This includes hot dogs, ribs, ham, sausage, salami, and bacon. Dairy  Whole milk and chocolate milk. Sour cream. Cream. Butter. Ice cream. Cream cheese. Drinks  Coffee and tea. Bubbly (carbonated) drinks or energy drinks. Condiments  Hot sauce. Barbecue sauce. Sweets/Desserts  Chocolate and cocoa. Donuts. Peppermint and spearmint. Fats and Oils  High-fat foods. This includes [place] fries and potato chips. Other  Vinegar. Strong spices. This includes black pepper, white pepper, red pepper, cayenne, curry powder, cloves, ginger, and chili powder. The items listed above may not be a complete list of foods and drinks to avoid. Contact your dietitian for more information.   This information is not intended to replace advice given to you by your health care provider. Make sure you discuss any questions you have with your health care provider. Document Released: 03/03/2012 Document Revised: 02/08/2016 Document Reviewed: 07/07/2013 Elsevier Interactive Patient Education  2017 Elsevier Inc. [place]ekerBland Diet Introduction A bland diet consists of foods that do not have a lot of fat or fiber. Foods without fat or fiber are easier for the body to digest. They are also less likely to irritate your mouth, throat, stomach, and other parts of your gastrointestinal tract. A bland diet is sometimes called a BRAT diet. What is my plan? Your health care provider or dietitian may recommend specific changes to your diet to prevent and treat your symptoms, such as:  Eating small meals often.  Cooking food until it is soft enough to chew easily.  Chewing your food well.  Drinking fluids slowly.  Not eating foods that are very spicy, sour, or fatty.  Not eating citrus fruits, such as oranges and grapefruit. What do I need to know about this diet?  Eat a variety of foods from the bland diet food list.  Do not follow a bland diet longer than you have to.  Ask your health care provider whether you should take vitamins. What foods can I eat? Grains  Hot cereals, such as cream of wheat. Bread, crackers, or tortillas made from refined white flour. Rice. Vegetables  Canned or cooked vegetables. Mashed or boiled potatoes. Fruits  Bananas. Applesauce. Other types of cooked or canned fruit with the skin and seeds removed, such as canned peaches or pears. Meats and Other Protein Sources  Scrambled eggs. Creamy peanut  butter or other nut butters. Lean, well-cooked meats, such as chicken or fish. Tofu. Soups or broths. Dairy  Low-fat dairy products, such as milk, cottage cheese, or yogurt. Beverages  Water. Herbal tea. Apple juice. Sweets and Desserts  Pudding. Custard. Fruit  gelatin. Ice cream. Fats and Oils  Mild salad dressings. [place] or olive oil. The items listed above may not be a complete list of allowed foods or beverages. Contact your dietitian for more options.  What foods are not recommended? Foods and ingredients that are often not recommended include:  Spicy foods, such as hot sauce or salsa.  Fried foods.  Sour foods, such as pickled or fermented foods.  Raw vegetables or fruits, especially citrus or berries.  Caffeinated drinks.  Alcohol.  Strongly flavored seasonings or condiments. The items listed above may not be a complete list of foods and beverages that are not allowed. Contact your dietitian for more information.  This information is not intended to replace advice given to you by your health care provider. Make sure you discuss any questions you have with your health care provider. Document Released: 12/25/2015 Document Revised: 02/08/2016 Document Reviewed: 09/14/2014  2017 Elsevier

## 2016-09-06 NOTE — MAU Provider Note (Signed)
[facility] Provider Note   CSN: [id number] Arrival date & time: 09/06/16  1757     History   Chief Complaint Chief Complaint  Patient presents with  . Abdominal Cramping    HPI Connie White is a 20 y.o. [id number] @[redacted]w[redacted]d  gestation who presents to the MAU with generalized abdominal cramping that started this morning and has gotten worse as the day has progressed. She complains of nausea and has vomited x1. She rates the pain as 8/10. She has taken nothing for pain.   The history is provided by the patient. No language interpreter was used.  Abdominal Cramping  The primary symptoms of the illness include abdominal pain, shortness of breath, nausea, vomiting and diarrhea. The primary symptoms of the illness do not include fever, dysuria, vaginal discharge or vaginal bleeding. The current episode started 13 to 24 hours ago. The onset of the illness was gradual. The problem has been gradually worsening.  The patient states that she believes she is currently pregnant. The patient has had a change in bowel habit (loose stools). Additional symptoms associated with the illness include anorexia, heartburn, frequency and back pain. Significant associated medical issues include GERD. Significant associated medical issues do not include gallstones, liver disease, substance abuse, diverticulitis, HIV or cardiac disease. Diabetes: gestational.    Past Medical History:  Diagnosis Date  . Gestational diabetes     Patient Active Problem List   Diagnosis Date Noted  . Supervision of normal pregnancy, antepartum 07/10/2016  . H/O preterm delivery, currently pregnant 07/10/2016  . History of gestational diabetes mellitus 03/10/2014  . ANEMIA, OTHER, UNSPECIFIED 11/13/2006    Past Surgical History:  Procedure Laterality Date  . NO PAST SURGERIES      OB History    Gravida Para Term Preterm AB Living   3 1 0 1 1 1    SAB TAB Ectopic Multiple Live Births   1 0 0 0 1       Home  Medications    Prior to Admission medications   Medication Sig Start Date End Date Taking? Authorizing Provider  ACCU-CHEK SMARTVIEW test strip TEST four times a day FASTING AND 2 HOURS AFTER EACH MEAL 08/27/16   Peggy Constant, MD  pantoprazole (PROTONIX) 40 MG tablet Take 1 tablet (40 mg total) by mouth daily. 08/20/16 08/20/17  Adam PhenixJames G Arnold, MD  Prenatal Vit-Fe Fumarate-FA (MULTIVITAMIN-PRENATAL) 27-0.8 MG TABS Take 1 tablet by mouth daily at 12 noon.    Historical Provider, MD  Prenatal Vit-Fe Phos-FA-Omega (VITAFOL GUMMIES) 3.33-0.333-34.8 MG CHEW Chew 3 tablets by mouth daily before breakfast. Patient not taking: Reported on 09/04/2016 06/12/16   Brock Badharles A Harper, MD    Family History Family History  Problem Relation Age of Onset  . Diabetes Mother     Social History Social History  Substance Use Topics  . Smoking status: Former Games developermoker  . Smokeless tobacco: Never Used  . Alcohol use No     Allergies   Patient has no known allergies.   Review of Systems Review of Systems  Constitutional: Positive for appetite change. Negative for fever.  HENT: Negative.   Eyes: Negative for discharge, redness, itching and visual disturbance.  Respiratory: Positive for shortness of breath. Negative for cough, chest tightness and wheezing.   Cardiovascular: Negative for chest pain and leg swelling.  Gastrointestinal: Positive for abdominal pain, anorexia, diarrhea, heartburn, nausea and vomiting.  Genitourinary: Positive for frequency. Negative for dysuria, vaginal bleeding, vaginal discharge and vaginal pain.  Musculoskeletal: Positive  for back pain and myalgias. Negative for neck stiffness.  Skin: Negative for rash.  Neurological: Positive for headaches. Negative for syncope.  Psychiatric/Behavioral: Negative for confusion. The patient is not nervous/anxious.      Physical Exam Updated Vital Signs BP 114/79   Pulse 106   Temp 98.1 F (36.7 C) (Oral)   Resp 18   LMP 03/30/2016  (Approximate)   Physical Exam  Constitutional: She is oriented to person, place, and time. She appears well-developed and well-nourished. No distress.  HENT:  Head: Normocephalic and atraumatic.  Eyes: EOM are normal.  Neck: Normal range of motion. Neck supple.  Cardiovascular: Regular rhythm.  Tachycardia present.   Pulmonary/Chest: Effort normal. No respiratory distress. She has no wheezes. She has no rales.  Abdominal: Soft. Bowel sounds are normal. There is generalized tenderness. There is no rebound, no guarding and no CVA tenderness.  Generalized abdominal tenderness with increased pain in the epigastric area with palpation.   Musculoskeletal: Normal range of motion.  Lymphadenopathy:    She has no cervical adenopathy.  Neurological: She is alert and oriented to person, place, and time. No cranial nerve deficit.  Skin: Skin is warm and dry.  Psychiatric: She has a normal mood and affect. Her behavior is normal.  Nursing note and vitals reviewed.    ED Treatments / Results  Labs (all labs ordered are listed, but only abnormal results are displayed) Labs Reviewed  CBC WITH DIFFERENTIAL/PLATELET - Abnormal; Notable for the following:       Result Value   WBC 15.2 (*)    Hemoglobin 10.9 (*)    HCT 34.0 (*)    MCV 70.7 (*)    MCH 22.7 (*)    Neutro Abs 13.0 (*)    All other components within normal limits  COMPREHENSIVE METABOLIC PANEL - Abnormal; Notable for the following:    Sodium 134 (*)    Glucose, Bld 105 (*)    All other components within normal limits  URINALYSIS, ROUTINE W REFLEX MICROSCOPIC  Radiology No results found.  Procedures Procedures (including critical care time)  Medications Ordered in ED Medications  lactated ringers bolus 1,000 mL (1,000 mLs Intravenous Given 09/06/16 1925)  famotidine (PEPCID) IVPB 20 mg premix (20 mg Intravenous Given 09/06/16 1946)  ondansetron (ZOFRAN) injection 4 mg (4 mg Intravenous Given 09/06/16 1943)     Initial  Impression / Assessment and Plan / ED Course  I have reviewed the triage vital signs and the nursing notes.  Pertinent labs & imaging results that were available during my care of the patient were reviewed by me and considered in my medical decision making (see chart for details).  Clinical Course   Care turned over to [facility], CNM, IVF infusing and patient stable to continue care with next provider. Tolerating po food and fluids. Nausea improved. No episodes of emesis. Stable for discharge home.  Final Clinical Impressions(s) / ED Diagnoses   1. Gastroenteritis   2. [redacted] weeks gestation of pregnancy   3. Gastroesophageal reflux disease without esophagitis    Discharge home Follow up at [facility] as scheduled BRAT diet Hydrate  Allergies as of 09/06/2016   No Known Allergies     Medication List    STOP taking these medications   multivitamin-prenatal 27-0.8 MG Tabs tablet     TAKE these medications   ACCU-CHEK SMARTVIEW test strip Generic drug:  glucose blood TEST four times a day FASTING AND 2 HOURS AFTER EACH MEAL   ondansetron 4  MG disintegrating tablet Commonly known as:  ZOFRAN ODT Take 1 tablet (4 mg total) by mouth every 8 (eight) hours as needed for nausea or vomiting.   pantoprazole 40 MG tablet Commonly known as:  PROTONIX Take 1 tablet (40 mg total) by mouth daily.   VITAFOL GUMMIES 3.33-0.333-34.8 MG Chew Chew 3 tablets by mouth daily before breakfast.      Donette LarryMelanie Darrell Leonhardt, CNM  09/06/2016 8:48 PM

## 2016-09-06 NOTE — MAU Note (Signed)
Pt stated she has felt crampin in her lower abd off and on all day and also c/o epigastric pain. Stated she has not felt like eating all day due to the pain. Felt a little nauseated as well.

## 2016-09-10 ENCOUNTER — Encounter (HOSPITAL_COMMUNITY): Payer: Self-pay

## 2016-09-10 ENCOUNTER — Ambulatory Visit (HOSPITAL_COMMUNITY)
Admission: RE | Admit: 2016-09-10 | Discharge: 2016-09-10 | Disposition: A | Discharge status: Home / Self Care | Payer: Medicaid Other | Source: Ambulatory Visit | Attending: Obstetrics and Gynecology | Admitting: Obstetrics and Gynecology

## 2016-09-10 ENCOUNTER — Other Ambulatory Visit (HOSPITAL_COMMUNITY): Payer: Self-pay | Admitting: *Deleted

## 2016-09-10 DIAGNOSIS — Z3A22 22 weeks gestation of pregnancy: Secondary | ICD-10-CM | POA: Diagnosis not present

## 2016-09-10 DIAGNOSIS — O09212 Supervision of pregnancy with history of pre-term labor, second trimester: Secondary | ICD-10-CM | POA: Diagnosis not present

## 2016-09-10 DIAGNOSIS — O09292 Supervision of pregnancy with other poor reproductive or obstetric history, second trimester: Secondary | ICD-10-CM | POA: Insufficient documentation

## 2016-09-10 DIAGNOSIS — O09219 Supervision of pregnancy with history of pre-term labor, unspecified trimester: Secondary | ICD-10-CM

## 2016-09-11 ENCOUNTER — Ambulatory Visit (INDEPENDENT_AMBULATORY_CARE_PROVIDER_SITE_OTHER): Payer: Medicaid Other | Admitting: *Deleted

## 2016-09-11 VITALS — BP 109/74 | HR 89

## 2016-09-11 DIAGNOSIS — O09899 Supervision of other high risk pregnancies, unspecified trimester: Secondary | ICD-10-CM

## 2016-09-11 DIAGNOSIS — O09212 Supervision of pregnancy with history of pre-term labor, second trimester: Secondary | ICD-10-CM

## 2016-09-11 DIAGNOSIS — O09219 Supervision of pregnancy with history of pre-term labor, unspecified trimester: Principal | ICD-10-CM

## 2016-09-11 NOTE — Progress Notes (Signed)
Pt in office for 17p injection. Pt tolerated well. Pt has no other concerns today.  Administrations This Visit    hydroxyprogesterone caproate (MAKENA) 250 mg/mL injection 250 mg    Admin Date 09/11/2016 Action Given Dose 250 mg Route Intramuscular Administered By Lanney GinsSuzanne D Rakesha Dalporto, CMA

## 2016-09-16 NOTE — L&D Delivery Note (Signed)
Delivery Note Called for imminent delivery.  Pushed well to quick delivery.  At 10:03 AM a viable and healthy female was delivered via Vaginal, Spontaneous Delivery (Presentation: ROA  ).  APGAR: 3, 8; weight  .   Placenta status: spontaneous  Cord:  with the following complications: nuchal x 1.  Cord pH: pending  Head emerged slowly.  RN notified of probable dystocia, team assembled.  Attempted delivery of ant shoulder with McRoberts, unsuccessful.  SP pressure applied, unsuccessful.  Able to grasp posterior axilla with position change of mother to oblique and part of shoulder delivered. Then I half corkscrewed body to effect delivery of both shoulders.  Handed to NICU team.  Cord gas obtained.   Anesthesia:  none Episiotomy: None Lacerations: None Suture Repair: none Est. Blood Loss (mL): 75  Connie White is a 21 y.o. female [id number] with IUP at [redacted]w[redacted]d admitted for PROM .  She progressed without augmentation to complete and pushed well to deliver.  Cord clamping not delayed.  Placenta intact and spontaneous, bleeding minimal.  No  Laceration.  Mom and baby stable prior to transfer to postpartum. She plans on breastfeeding. She requests  Mom to postpartum.  Baby to Couplet care / Skin to Skin.  Wynelle Bourgeois 12/19/2016, 10:54 AM

## 2016-09-18 ENCOUNTER — Ambulatory Visit (INDEPENDENT_AMBULATORY_CARE_PROVIDER_SITE_OTHER): Payer: Medicaid Other | Admitting: Certified Nurse Midwife

## 2016-09-18 ENCOUNTER — Encounter: Payer: Self-pay | Admitting: Certified Nurse Midwife

## 2016-09-18 ENCOUNTER — Ambulatory Visit: Payer: Self-pay

## 2016-09-18 VITALS — BP 117/75 | HR 87 | Wt 154.0 lb

## 2016-09-18 DIAGNOSIS — O24415 Gestational diabetes mellitus in pregnancy, controlled by oral hypoglycemic drugs: Secondary | ICD-10-CM

## 2016-09-18 DIAGNOSIS — O09212 Supervision of pregnancy with history of pre-term labor, second trimester: Secondary | ICD-10-CM

## 2016-09-18 DIAGNOSIS — O09219 Supervision of pregnancy with history of pre-term labor, unspecified trimester: Secondary | ICD-10-CM

## 2016-09-18 DIAGNOSIS — O24410 Gestational diabetes mellitus in pregnancy, diet controlled: Secondary | ICD-10-CM

## 2016-09-18 DIAGNOSIS — O09899 Supervision of other high risk pregnancies, unspecified trimester: Secondary | ICD-10-CM

## 2016-09-18 DIAGNOSIS — Z3490 Encounter for supervision of normal pregnancy, unspecified, unspecified trimester: Secondary | ICD-10-CM

## 2016-09-18 MED ORDER — GLYBURIDE 2.5 MG PO TABS: 2.5000 mg | ORAL_TABLET | Freq: Every day | ORAL | 3 refills | Status: DC

## 2016-09-18 NOTE — Progress Notes (Signed)
Subjective:    Connie White is a 21 y.o. female being seen today for her obstetrical visit. She is at 953w1d gestation. Patient reports: no complaints . Fetal movement: normal.  Fasting blood sugar log, all abnormally elevated fasting blood sugars: range of 110-120's, and 2 hour PP WNL.    Problem List Items Addressed This Visit      Endocrine   Gestational diabetes - Primary   Relevant Medications   glyBURIDE (DIABETA) 2.5 MG tablet   Other Relevant Orders   AMB referral to maternal fetal medicine   Amb Referral to [facility]     Other   Supervision of normal pregnancy, antepartum   H/O preterm delivery, currently pregnant     Patient Active Problem List   Diagnosis Date Noted  . Supervision of normal pregnancy, antepartum 07/10/2016  . H/O preterm delivery, currently pregnant 07/10/2016  . Gestational diabetes 03/10/2014  . ANEMIA, OTHER, UNSPECIFIED 11/13/2006   Objective:    BP 117/75   Pulse 87   Wt 154 lb (69.9 kg)   LMP 03/30/2016 (Approximate)   BMI 35.79 kg/m  FHT: 153 BPM  Uterine Size: 24 cm and size equals dates     Assessment:   F/U MFM US scheduled for 09/24/16  MFM diabetes management scheduled.   Pregnancy @ 8853w1d    GDM uncontrolled  Plan:   Hx of PTB: 17-P today  GDM: Glyburide started with agreement in POC with Dr. Ladean Rayaonstance  Signs and symptoms of preterm labor: discussed.  Labs, problem list reviewed and updated  Follow up in 2 weeks for blood sugar evaluation/management.

## 2016-09-19 ENCOUNTER — Encounter: Payer: Self-pay | Admitting: Skilled Nursing Facility1

## 2016-09-19 ENCOUNTER — Encounter: Payer: Medicaid Other | Attending: Obstetrics & Gynecology | Admitting: Skilled Nursing Facility1

## 2016-09-19 DIAGNOSIS — O24419 Gestational diabetes mellitus in pregnancy, unspecified control: Secondary | ICD-10-CM | POA: Diagnosis present

## 2016-09-19 DIAGNOSIS — Z713 Dietary counseling and surveillance: Secondary | ICD-10-CM | POA: Insufficient documentation

## 2016-09-19 DIAGNOSIS — O24410 Gestational diabetes mellitus in pregnancy, diet controlled: Secondary | ICD-10-CM

## 2016-09-19 NOTE — Progress Notes (Signed)
  Patient was seen on 09/20/2015 for Gestational Diabetes self-management class at [facility]. The following learning objectives were met by the patient during this course:   States the definition of Gestational Diabetes  States why dietary management is important in controlling blood glucose  Describes the effects each nutrient has on blood glucose levels  Demonstrates ability to create a balanced meal plan  Demonstrates carbohydrate counting   States when to check blood glucose levels involving a total of 4 separate occurences in a day  Demonstrates proper blood glucose monitoring techniques  States the effect of stress and exercise on blood glucose levels  States the importance of limiting caffeine and abstaining from alcohol and smoking  Demonstrates the knowledge the glucometer provided in class may not be covered by their insurance and to call their insurance provider immediately after class to know which glucometer their insurance provider does cover as well as calling their physician the next day for a prescription to the glucometer their insurance does cover (if the one provided is not) as well as the lancets and strips for that meter.  Blood glucose monitor given: pt already had her own Blood glucose reading: 116  Patient instructed to monitor glucose levels: FBS: 60 - <90 1 hour: <140 2 hour: <120  *Patient received handouts:  Nutrition Diabetes and Pregnancy  Carbohydrate Counting List  Patient will be seen for follow-up as needed.

## 2016-09-24 ENCOUNTER — Ambulatory Visit (HOSPITAL_COMMUNITY)
Admission: RE | Admit: 2016-09-24 | Discharge: 2016-09-24 | Disposition: A | Discharge status: Home / Self Care | Payer: Medicaid Other | Source: Ambulatory Visit | Attending: Obstetrics and Gynecology | Admitting: Obstetrics and Gynecology

## 2016-09-24 ENCOUNTER — Encounter (HOSPITAL_COMMUNITY): Payer: Self-pay

## 2016-09-24 ENCOUNTER — Other Ambulatory Visit (HOSPITAL_COMMUNITY): Payer: Self-pay | Admitting: *Deleted

## 2016-09-24 DIAGNOSIS — Z3A24 24 weeks gestation of pregnancy: Secondary | ICD-10-CM | POA: Insufficient documentation

## 2016-09-24 DIAGNOSIS — O09212 Supervision of pregnancy with history of pre-term labor, second trimester: Secondary | ICD-10-CM | POA: Insufficient documentation

## 2016-09-24 DIAGNOSIS — O09219 Supervision of pregnancy with history of pre-term labor, unspecified trimester: Secondary | ICD-10-CM

## 2016-09-24 DIAGNOSIS — O09292 Supervision of pregnancy with other poor reproductive or obstetric history, second trimester: Secondary | ICD-10-CM | POA: Diagnosis not present

## 2016-09-24 DIAGNOSIS — O24415 Gestational diabetes mellitus in pregnancy, controlled by oral hypoglycemic drugs: Secondary | ICD-10-CM | POA: Insufficient documentation

## 2016-09-24 DIAGNOSIS — O99212 Obesity complicating pregnancy, second trimester: Secondary | ICD-10-CM | POA: Insufficient documentation

## 2016-09-25 ENCOUNTER — Ambulatory Visit: Payer: Medicaid Other | Admitting: *Deleted

## 2016-09-25 VITALS — BP 108/72 | HR 91 | Wt 154.4 lb

## 2016-09-25 DIAGNOSIS — O09899 Supervision of other high risk pregnancies, unspecified trimester: Secondary | ICD-10-CM

## 2016-09-25 DIAGNOSIS — O09219 Supervision of pregnancy with history of pre-term labor, unspecified trimester: Principal | ICD-10-CM

## 2016-10-02 ENCOUNTER — Encounter: Payer: Self-pay | Admitting: Obstetrics and Gynecology

## 2016-10-04 ENCOUNTER — Telehealth: Payer: Self-pay

## 2016-10-04 NOTE — Telephone Encounter (Signed)
Called pt to r/s for her 17p injection to come in today 1/19. Due to weather, pt can not come in and wants to just keep her 1/24 appt.

## 2016-10-09 ENCOUNTER — Ambulatory Visit (INDEPENDENT_AMBULATORY_CARE_PROVIDER_SITE_OTHER): Payer: Medicaid Other

## 2016-10-09 DIAGNOSIS — O09219 Supervision of pregnancy with history of pre-term labor, unspecified trimester: Principal | ICD-10-CM

## 2016-10-09 DIAGNOSIS — O09899 Supervision of other high risk pregnancies, unspecified trimester: Secondary | ICD-10-CM

## 2016-10-09 DIAGNOSIS — O09212 Supervision of pregnancy with history of pre-term labor, second trimester: Secondary | ICD-10-CM | POA: Diagnosis not present

## 2016-10-09 NOTE — Progress Notes (Signed)
Pt presents for 17p injection given R upper outer quad w/o difficulty.

## 2016-10-16 ENCOUNTER — Ambulatory Visit: Payer: Self-pay

## 2016-10-16 ENCOUNTER — Ambulatory Visit (INDEPENDENT_AMBULATORY_CARE_PROVIDER_SITE_OTHER): Payer: Medicaid Other | Admitting: Obstetrics and Gynecology

## 2016-10-16 VITALS — BP 120/75 | HR 101 | Wt 157.0 lb

## 2016-10-16 DIAGNOSIS — O09219 Supervision of pregnancy with history of pre-term labor, unspecified trimester: Secondary | ICD-10-CM

## 2016-10-16 DIAGNOSIS — O09212 Supervision of pregnancy with history of pre-term labor, second trimester: Secondary | ICD-10-CM

## 2016-10-16 DIAGNOSIS — O26899 Other specified pregnancy related conditions, unspecified trimester: Secondary | ICD-10-CM

## 2016-10-16 DIAGNOSIS — O26892 Other specified pregnancy related conditions, second trimester: Secondary | ICD-10-CM

## 2016-10-16 DIAGNOSIS — O09899 Supervision of other high risk pregnancies, unspecified trimester: Secondary | ICD-10-CM

## 2016-10-16 DIAGNOSIS — O24415 Gestational diabetes mellitus in pregnancy, controlled by oral hypoglycemic drugs: Secondary | ICD-10-CM

## 2016-10-16 DIAGNOSIS — M25559 Pain in unspecified hip: Secondary | ICD-10-CM

## 2016-10-16 DIAGNOSIS — Z3490 Encounter for supervision of normal pregnancy, unspecified, unspecified trimester: Secondary | ICD-10-CM

## 2016-10-16 MED ORDER — GLYBURIDE 5 MG PO TABS: 5.0000 mg | ORAL_TABLET | Freq: Every day | ORAL | 3 refills | Status: DC

## 2016-10-16 MED ORDER — CYCLOBENZAPRINE HCL 10 MG PO TABS: 10.0000 mg | ORAL_TABLET | Freq: Three times a day (TID) | ORAL | 1 refills | Status: DC | PRN

## 2016-10-16 NOTE — Progress Notes (Signed)
Subjective:  Connie White is a 21 y.o. [id number] at 5766w1d being seen today for ongoing prenatal care.  She is currently monitored for the following issues for this high-risk pregnancy and has ANEMIA, OTHER, UNSPECIFIED; Gestational diabetes; Supervision of normal pregnancy, antepartum; H/O preterm delivery, currently pregnant; and Pregnancy related hip pain, antepartum on her problem list.  Patient reports right hip and leg pain, feels weak and leg gives out at times.  Contractions: Not present. Vag. Bleeding: None.  Movement: Present. Denies leaking of fluid.   The following portions of the patient's history were reviewed and updated as appropriate: allergies, current medications, past family history, past medical history, past social history, past surgical history and problem list. Problem list updated.  Objective:   Vitals:   10/16/16 0934  BP: 120/75  Pulse: (!) 101  Weight: 157 lb (71.2 kg)    Fetal Status: Fetal Heart Rate (bpm): 151   Movement: Present     General:  Alert, oriented and cooperative. Patient is in no acute distress.  Skin: Skin is warm and dry. No rash noted.   Cardiovascular: Normal heart rate noted  Respiratory: Normal respiratory effort, no problems with respiration noted  Abdomen: Soft, gravid, appropriate for gestational age. Pain/Pressure: Present     Pelvic:  Cervical exam deferred        Extremities: Normal range of motion.  Edema: None  Mental Status: Normal mood and affect. Normal behavior. Normal judgment and thought content.   Urinalysis:      Assessment and Plan:  Pregnancy: [id number] at 2666w1d  1. Encounter for supervision of normal pregnancy, antepartum, unspecified gravidity   2. Gestational diabetes mellitus (GDM) in second trimester controlled on oral hypoglycemic drug BS not in goal range. Will adjust Diabeta as noted Importance of following diet and monitoring BS discussed with pt. - glyBURIDE (DIABETA) 5 MG tablet; Take 1 tablet (5 mg  total) by mouth at bedtime.  Dispense: 30 tablet; Refill: 3  3. H/O preterm delivery, currently pregnant D/T PROM at 33 wks Continue wit 17 OHP  4. Pregnancy related hip pain, antepartum Back and leg exercise information provided to pt Will also try Flexeril PRN - cyclobenzaprine (FLEXERIL) 10 MG tablet; Take 1 tablet (10 mg total) by mouth every 8 (eight) hours as needed for muscle spasms.  Dispense: 30 tablet; Refill: 1  Preterm labor symptoms and general obstetric precautions including but not limited to vaginal bleeding, contractions, leaking of fluid and fetal movement were reviewed in detail with the patient. Please refer to After Visit Summary for other counseling recommendations.  Return in about 2 weeks (around 10/30/2016) for OB visit.   Hermina StaggersMichael L Ervin, MD

## 2016-10-16 NOTE — Patient Instructions (Signed)
Back Exercises Introduction If you have pain in your back, do these exercises 2-3 times each day or as told by your doctor. When the pain goes away, do the exercises once each day, but repeat the steps more times for each exercise (do more repetitions). If you do not have pain in your back, do these exercises once each day or as told by your doctor. Exercises Single Knee to Chest  Do these steps 3-5 times in a row for each leg: 1. Lie on your back on a firm bed or the floor with your legs stretched out. 2. Bring one knee to your chest. 3. Hold your knee to your chest by grabbing your knee or thigh. 4. Pull on your knee until you feel a gentle stretch in your lower back. 5. Keep doing the stretch for 10-30 seconds. 6. Slowly let go of your leg and straighten it. Pelvic Tilt  Do these steps 5-10 times in a row: 1. Lie on your back on a firm bed or the floor with your legs stretched out. 2. Bend your knees so they point up to the ceiling. Your feet should be flat on the floor. 3. Tighten your lower belly (abdomen) muscles to press your lower back against the floor. This will make your tailbone point up to the ceiling instead of pointing down to your feet or the floor. 4. Stay in this position for 5-10 seconds while you gently tighten your muscles and breathe evenly. Cat-Cow  Do these steps until your lower back bends more easily: 1. Get on your hands and knees on a firm surface. Keep your hands under your shoulders, and keep your knees under your hips. You may put padding under your knees. 2. Let your head hang down, and make your tailbone point down to the floor so your lower back is round like the back of a cat. 3. Stay in this position for 5 seconds. 4. Slowly lift your head and make your tailbone point up to the ceiling so your back hangs low (sags) like the back of a cow. 5. Stay in this position for 5 seconds. Press-Ups  Do these steps 5-10 times in a row: 1. Lie on your belly  (face-down) on the floor. 2. Place your hands near your head, about shoulder-width apart. 3. While you keep your back relaxed and keep your hips on the floor, slowly straighten your arms to raise the top half of your body and lift your shoulders. Do not use your back muscles. To make yourself more comfortable, you may change where you place your hands. 4. Stay in this position for 5 seconds. 5. Slowly return to lying flat on the floor. Bridges  Do these steps 10 times in a row: 1. Lie on your back on a firm surface. 2. Bend your knees so they point up to the ceiling. Your feet should be flat on the floor. 3. Tighten your butt muscles and lift your butt off of the floor until your waist is almost as high as your knees. If you do not feel the muscles working in your butt and the back of your thighs, slide your feet 1-2 inches farther away from your butt. 4. Stay in this position for 3-5 seconds. 5. Slowly lower your butt to the floor, and let your butt muscles relax. If this exercise is too easy, try doing it with your arms crossed over your chest. Belly Crunches  Do these steps 5-10 times in a row: 1. Lie   on your back on a firm bed or the floor with your legs stretched out. 2. Bend your knees so they point up to the ceiling. Your feet should be flat on the floor. 3. Cross your arms over your chest. 4. Tip your chin a little bit toward your chest but do not bend your neck. 5. Tighten your belly muscles and slowly raise your chest just enough to lift your shoulder blades a tiny bit off of the floor. 6. Slowly lower your chest and your head to the floor. Back Lifts  Do these steps 5-10 times in a row: 1. Lie on your belly (face-down) with your arms at your sides, and rest your forehead on the floor. 2. Tighten the muscles in your legs and your butt. 3. Slowly lift your chest off of the floor while you keep your hips on the floor. Keep the back of your head in line with the curve in your back.  Look at the floor while you do this. 4. Stay in this position for 3-5 seconds. 5. Slowly lower your chest and your face to the floor. Contact a doctor if:  Your back pain gets a lot worse when you do an exercise.  Your back pain does not lessen 2 hours after you exercise. If you have any of these problems, stop doing the exercises. Do not do them again unless your doctor says it is okay. Get help right away if:  You have sudden, very bad back pain. If this happens, stop doing the exercises. Do not do them again unless your doctor says it is okay. This information is not intended to replace advice given to you by your health care provider. Make sure you discuss any questions you have with your health care provider. Document Released: 10/05/2010 Document Revised: 02/08/2016 Document Reviewed: 10/27/2014  2017 Elsevier Back Pain in Pregnancy Introduction Back pain during pregnancy is common. Back pain may be caused by several factors that are related to changes during your pregnancy. Follow these instructions at home: Managing pain, stiffness, and swelling  If directed, apply ice for sudden (acute) back pain.  Put ice in a plastic bag.  Place a towel between your skin and the bag.  Leave the ice on for 20 minutes, 2-3 times per day.  If directed, apply heat to the affected area before you exercise:  Place a towel between your skin and the heat pack or heating pad.  Leave the heat on for 20-30 minutes.  Remove the heat if your skin turns bright red. This is especially important if you are unable to feel pain, heat, or cold. You may have a greater risk of getting burned. Activity  Exercise as told by your health care provider. Exercising is the best way to prevent or manage back pain.  Listen to your body when lifting. If lifting hurts, ask for help or bend your knees. This uses your leg muscles instead of your back muscles.  Squat down when picking up something from the floor.  Do not bend over.  Only use bed rest as told by your health care provider. Bed rest should only be used for the most severe episodes of back pain. Standing, Sitting, and Lying Down  Do not stand in one place for long periods of time.  Use good posture when sitting. Make sure your head rests over your shoulders and is not hanging forward. Use a pillow on your lower back if necessary.  Try sleeping on your side, preferably   the left side, with a pillow or two between your legs. If you are sore after a night's rest, your bed may be too soft. A firm mattress may provide more support for your back during pregnancy. General instructions  Do not wear high heels.  Eat a healthy diet. Try to gain weight within your health care provider's recommendations.  Use a maternity girdle, elastic sling, or back brace as told by your health care provider.  Take over-the-counter and prescription medicines only as told by your health care provider.  Keep all follow-up visits as told by your health care provider. This is important. This includes any visits with any specialists, such as a physical therapist. Contact a health care provider if:  Your back pain interferes with your daily activities.  You have increasing pain in other parts of your body. Get help right away if:  You develop numbness, tingling, weakness, or problems with the use of your arms or legs.  You develop severe back pain that is not controlled with medicine.  You have a sudden change in bowel or bladder control.  You develop shortness of breath, dizziness, or you faint.  You develop nausea, vomiting, or sweating.  You have back pain that is a rhythmic, cramping pain similar to labor pains. Labor pain is usually 1-2 minutes apart, lasts for about 1 minute, and involves a bearing down feeling or pressure in your pelvis.  You have back pain and your water breaks or you have vaginal bleeding.  You have back pain or numbness that  travels down your leg.  Your back pain developed after you fell.  You develop pain on one side of your back.  You see blood in your urine.  You develop skin blisters in the area of your back pain. This information is not intended to replace advice given to you by your health care provider. Make sure you discuss any questions you have with your health care provider. Document Released: 12/11/2005 Document Revised: 02/08/2016 Document Reviewed: 05/17/2015  2017 Elsevier  

## 2016-10-18 ENCOUNTER — Encounter (HOSPITAL_COMMUNITY): Payer: Self-pay

## 2016-10-18 ENCOUNTER — Inpatient Hospital Stay (HOSPITAL_COMMUNITY)
Admission: AD | Admit: 2016-10-18 | Discharge: 2016-10-23 | DRG: 781 | Disposition: A | Discharge status: Home / Self Care | Payer: Medicaid Other | Source: Ambulatory Visit | Attending: Obstetrics & Gynecology | Admitting: Obstetrics & Gynecology

## 2016-10-18 ENCOUNTER — Inpatient Hospital Stay (HOSPITAL_COMMUNITY): Payer: Medicaid Other

## 2016-10-18 DIAGNOSIS — K8020 Calculus of gallbladder without cholecystitis without obstruction: Secondary | ICD-10-CM | POA: Diagnosis present

## 2016-10-18 DIAGNOSIS — K8010 Calculus of gallbladder with chronic cholecystitis without obstruction: Secondary | ICD-10-CM | POA: Diagnosis present

## 2016-10-18 DIAGNOSIS — K219 Gastro-esophageal reflux disease without esophagitis: Secondary | ICD-10-CM | POA: Diagnosis present

## 2016-10-18 DIAGNOSIS — R945 Abnormal results of liver function studies: Secondary | ICD-10-CM | POA: Diagnosis not present

## 2016-10-18 DIAGNOSIS — Z3A27 27 weeks gestation of pregnancy: Secondary | ICD-10-CM

## 2016-10-18 DIAGNOSIS — O26612 Liver and biliary tract disorders in pregnancy, second trimester: Secondary | ICD-10-CM

## 2016-10-18 DIAGNOSIS — K831 Obstruction of bile duct: Secondary | ICD-10-CM

## 2016-10-18 DIAGNOSIS — D649 Anemia, unspecified: Secondary | ICD-10-CM | POA: Diagnosis present

## 2016-10-18 DIAGNOSIS — K8001 Calculus of gallbladder with acute cholecystitis with obstruction: Secondary | ICD-10-CM | POA: Diagnosis not present

## 2016-10-18 DIAGNOSIS — O09219 Supervision of pregnancy with history of pre-term labor, unspecified trimester: Secondary | ICD-10-CM

## 2016-10-18 DIAGNOSIS — Z87891 Personal history of nicotine dependence: Secondary | ICD-10-CM

## 2016-10-18 DIAGNOSIS — O24415 Gestational diabetes mellitus in pregnancy, controlled by oral hypoglycemic drugs: Secondary | ICD-10-CM | POA: Diagnosis present

## 2016-10-18 DIAGNOSIS — O0993 Supervision of high risk pregnancy, unspecified, third trimester: Secondary | ICD-10-CM

## 2016-10-18 DIAGNOSIS — O09899 Supervision of other high risk pregnancies, unspecified trimester: Secondary | ICD-10-CM

## 2016-10-18 DIAGNOSIS — O99012 Anemia complicating pregnancy, second trimester: Secondary | ICD-10-CM | POA: Diagnosis present

## 2016-10-18 DIAGNOSIS — Z3A28 28 weeks gestation of pregnancy: Secondary | ICD-10-CM | POA: Diagnosis not present

## 2016-10-18 DIAGNOSIS — R1011 Right upper quadrant pain: Secondary | ICD-10-CM | POA: Diagnosis present

## 2016-10-18 DIAGNOSIS — O99613 Diseases of the digestive system complicating pregnancy, third trimester: Secondary | ICD-10-CM | POA: Diagnosis not present

## 2016-10-18 DIAGNOSIS — O99612 Diseases of the digestive system complicating pregnancy, second trimester: Secondary | ICD-10-CM | POA: Diagnosis present

## 2016-10-18 DIAGNOSIS — R10811 Right upper quadrant abdominal tenderness: Secondary | ICD-10-CM

## 2016-10-18 DIAGNOSIS — K8042 Calculus of bile duct with acute cholecystitis without obstruction: Secondary | ICD-10-CM

## 2016-10-18 LAB — COMPREHENSIVE METABOLIC PANEL
ALK PHOS: 78 U/L (ref 38–126)
ALT: 70 U/L — AB (ref 14–54)
AST: 112 U/L — AB (ref 15–41)
Albumin: 3.2 g/dL — ABNORMAL LOW (ref 3.5–5.0)
Anion gap: 10 (ref 5–15)
BUN: 7 mg/dL (ref 6–20)
CALCIUM: 9 mg/dL (ref 8.9–10.3)
CHLORIDE: 103 mmol/L (ref 101–111)
CO2: 21 mmol/L — AB (ref 22–32)
CREATININE: 0.47 mg/dL (ref 0.44–1.00)
GFR calc non Af Amer: >60 mL/min (ref >60)
GLUCOSE: 148 mg/dL — AB (ref 65–99)
Potassium: 4.5 mmol/L (ref 3.5–5.1)
SODIUM: 134 mmol/L — AB (ref 135–145)
Total Bilirubin: 1.7 mg/dL — ABNORMAL HIGH (ref 0.3–1.2)
Total Protein: 6.9 g/dL (ref 6.5–8.1)

## 2016-10-18 LAB — CBC WITH DIFFERENTIAL/PLATELET
BASOS ABS: 0 10*3/uL (ref 0.0–0.1)
Basophils Relative: 0 %
EOS ABS: 0.2 10*3/uL (ref 0.0–0.7)
EOS PCT: 1 %
HCT: 33.2 % — ABNORMAL LOW (ref 36.0–46.0)
HEMOGLOBIN: 10.8 g/dL — AB (ref 12.0–15.0)
LYMPHS ABS: 1.6 10*3/uL (ref 0.7–4.0)
LYMPHS PCT: 11 %
MCH: 22.7 pg — AB (ref 26.0–34.0)
MCHC: 32.5 g/dL (ref 30.0–36.0)
MCV: 69.9 fL — ABNORMAL LOW (ref 78.0–100.0)
Monocytes Absolute: 0.4 10*3/uL (ref 0.1–1.0)
Monocytes Relative: 3 %
NEUTROS PCT: 85 %
Neutro Abs: 12.5 10*3/uL — ABNORMAL HIGH (ref 1.7–7.7)
PLATELETS: 381 10*3/uL (ref 150–400)
RBC: 4.75 MIL/uL (ref 3.87–5.11)
RDW: 13.8 % (ref 11.5–15.5)
WBC: 14.7 10*3/uL — ABNORMAL HIGH (ref 4.0–10.5)

## 2016-10-18 LAB — TYPE AND SCREEN
ABO/RH(D): B POS
Antibody Screen: NEGATIVE

## 2016-10-18 LAB — LIPASE, BLOOD: Lipase: 22 U/L (ref 11–51)

## 2016-10-18 LAB — URINALYSIS, ROUTINE W REFLEX MICROSCOPIC
BILIRUBIN URINE: NEGATIVE
Glucose, UA: NEGATIVE mg/dL
Hgb urine dipstick: NEGATIVE
Ketones, ur: 5 mg/dL — AB
Nitrite: NEGATIVE
PROTEIN: NEGATIVE mg/dL
SPECIFIC GRAVITY, URINE: 1.017 (ref 1.005–1.030)
pH: 7 (ref 5.0–8.0)

## 2016-10-18 LAB — GLUCOSE, CAPILLARY
GLUCOSE-CAPILLARY: 98 mg/dL (ref 65–99)
GLUCOSE-CAPILLARY: 92 mg/dL (ref 65–99)
Glucose-Capillary: 116 mg/dL — ABNORMAL HIGH (ref 65–99)
Glucose-Capillary: 105 mg/dL — ABNORMAL HIGH (ref 65–99)

## 2016-10-18 MED ORDER — CALCIUM CARBONATE ANTACID 500 MG PO CHEW: 2.0000 | CHEWABLE_TABLET | ORAL | Status: DC | PRN

## 2016-10-18 MED ORDER — ONDANSETRON HCL 4 MG/2ML IJ SOLN
4.0000 mg | Freq: Four times a day (QID) | INTRAMUSCULAR | Status: DC | PRN
  Administered 2016-10-18 – 2016-10-22 (×4): 4 mg via INTRAVENOUS
  Filled 2016-10-18 (×4): qty 2

## 2016-10-18 MED ORDER — HYDROMORPHONE HCL 1 MG/ML IJ SOLN
1.0000 mg | Freq: Once | INTRAMUSCULAR | Status: AC
  Administered 2016-10-18: 1 mg via INTRAVENOUS
  Filled 2016-10-18: qty 1

## 2016-10-18 MED ORDER — DOCUSATE SODIUM 100 MG PO CAPS
100.0000 mg | ORAL_CAPSULE | Freq: Every day | ORAL | Status: DC
  Filled 2016-10-18: qty 1

## 2016-10-18 MED ORDER — PRENATAL MULTIVITAMIN CH
1.0000 | ORAL_TABLET | Freq: Every day | ORAL | Status: DC
  Filled 2016-10-18: qty 1

## 2016-10-18 MED ORDER — ZOLPIDEM TARTRATE 5 MG PO TABS: 5.0000 mg | ORAL_TABLET | Freq: Every evening | ORAL | Status: DC | PRN

## 2016-10-18 MED ORDER — DEXTROSE 5 % IV SOLN
2.0000 g | INTRAVENOUS | Status: DC
  Administered 2016-10-18 – 2016-10-22 (×5): 2 g via INTRAVENOUS
  Filled 2016-10-18 (×8): qty 2

## 2016-10-18 MED ORDER — INSULIN ASPART 100 UNIT/ML ~~LOC~~ SOLN: 0.0000 [IU] | SUBCUTANEOUS | Status: DC

## 2016-10-18 MED ORDER — HYDROMORPHONE HCL 1 MG/ML IJ SOLN
1.0000 mg | INTRAMUSCULAR | Status: DC | PRN
  Administered 2016-10-18 – 2016-10-19 (×5): 1 mg via INTRAVENOUS
  Filled 2016-10-18 (×5): qty 1

## 2016-10-18 MED ORDER — LACTATED RINGERS IV BOLUS (SEPSIS)
1000.0000 mL | Freq: Once | INTRAVENOUS | Status: AC
  Administered 2016-10-18: 1000 mL via INTRAVENOUS

## 2016-10-18 MED ORDER — LACTATED RINGERS IV SOLN
INTRAVENOUS | Status: DC
  Administered 2016-10-18 – 2016-10-21 (×9): via INTRAVENOUS

## 2016-10-18 MED ORDER — ACETAMINOPHEN 325 MG PO TABS: 650.0000 mg | ORAL_TABLET | ORAL | Status: DC | PRN

## 2016-10-18 MED ORDER — OXYCODONE HCL 5 MG PO TABS
5.0000 mg | ORAL_TABLET | Freq: Once | ORAL | Status: AC
  Administered 2016-10-18: 5 mg via ORAL
  Filled 2016-10-18: qty 1

## 2016-10-18 NOTE — H&P (Signed)
ANTEPARTUM ADMISSION HISTORY AND PHYSICAL NOTE   History of Present Illness: Connie White is a 21 y.o. [id number] at [redacted]w[redacted]d admitted for RUQ abdominal pain, elevated LFTs and ultrasound with biliary sludge and gallstones concerning for potential cholecystitis.  She reports the onset of mid-epigastric and RUQ abdominal pain at 2 am this morning, waking her from sleep.  The pain is described and sharp and feeling like a tightness in the mid-epigastric region radiating around her back, slightly worse on the right compared to the left. The pain is persistent. The pain is worsened with laying on her side. Nothing has relieved the pain. She has not tried any medications. Mild nausea. No vomiting. No diarrhea or constipation. No dysuria or vaginal discharge. No fevers or chills. She last ate cereal for dinner around 9PM and is currently on protonix for acid reflux.   Of note, the patient's history is remarkable for PPROM with delivery of newborn at 33 weeks as well GDM A2 for which she is currently on glyburide.   Patient reports the fetal movement as active. Patient reports uterine contraction  activity as none. Patient reports  vaginal bleeding as none. Patient describes fluid per vagina as None. Fetal presentation is unsure.  Patient Active Problem List   Diagnosis Date Noted  . Gallstones 10/18/2016  . Pregnancy related hip pain, antepartum 10/16/2016  . Supervision of normal pregnancy, antepartum 07/10/2016  . H/O preterm delivery, currently pregnant 07/10/2016  . Gestational diabetes 03/10/2014  . ANEMIA, OTHER, UNSPECIFIED 11/13/2006    Past Medical History:  Diagnosis Date  . Gestational diabetes     Past Surgical History:  Procedure Laterality Date  . NO PAST SURGERIES      OB History  Gravida Para Term Preterm AB Living  3 1 0 1 1 1   SAB TAB Ectopic Multiple Live Births  1 0 0 0 1    # Outcome Date GA Lbr Len/2nd Weight Sex Delivery Anes PTL Lv  3 Current           2  Preterm 04/06/14 [redacted]w[redacted]d 05:13 / 00:13 5 lb 15.2 oz (2.7 kg) M Vag-Spont None  LIV  1 SAB 2014        DEC      Social History   Social History  . Marital status: Single    Spouse name: N/A  . Number of children: N/A  . Years of education: N/A   Social History Main Topics  . Smoking status: Former Games developer  . Smokeless tobacco: Never Used  . Alcohol use No  . Drug use: No  . Sexual activity: Not Currently    Partners: Male    Birth control/ protection: None   Other Topics Concern  . None   Social History Narrative  . None    Family History  Problem Relation Age of Onset  . Diabetes Mother     No Known Allergies  Facility-Administered Medications Prior to Admission  Medication Dose Route Frequency Provider Last Rate Last Dose  . hydroxyprogesterone caproate (MAKENA) 250 mg/mL injection 250 mg  250 mg Intramuscular Weekly Adam Phenix, MD   250 mg at 10/16/16 0945   Prescriptions Prior to Admission  Medication Sig Dispense Refill Last Dose  . ACCU-CHEK FASTCLIX LANCETS MISC TEST four times a day 102 each 12 Taking  . ACCU-CHEK SMARTVIEW test strip TEST four times a day FASTING AND 2 HOURS AFTER EACH MEAL 100 each 12 Taking  . cyclobenzaprine (FLEXERIL) 10 MG tablet Take 1  tablet (10 mg total) by mouth every 8 (eight) hours as needed for muscle spasms. 30 tablet 1   . glyBURIDE (DIABETA) 5 MG tablet Take 1 tablet (5 mg total) by mouth at bedtime. 30 tablet 3   . ondansetron (ZOFRAN ODT) 4 MG disintegrating tablet Take 1 tablet (4 mg total) by mouth every 8 (eight) hours as needed for nausea or vomiting. 20 tablet 0 Taking  . pantoprazole (PROTONIX) 40 MG tablet Take 1 tablet (40 mg total) by mouth daily. 30 tablet 5 Taking  . Prenatal Vit-Fe Phos-FA-Omega (VITAFOL GUMMIES) 3.33-0.333-34.8 MG CHEW Chew 3 tablets by mouth daily before breakfast. 90 tablet 11 Taking    Review of Systems - Negative except as noted HPI  Vitals:  BP 119/64 (BP Location: Right Arm)   Pulse 99    Temp 97.6 F (36.4 C) (Oral)   Resp 20   Ht 4' 7.5" (1.41 m)   Wt 158 lb (71.7 kg)   LMP 03/30/2016 (Approximate)   SpO2 96%   BMI 36.06 kg/m  Physical Examination: CONSTITUTIONAL: Well-developed, well-nourished female in no acute distress. Does appear uncomfortable at times.  HENT:  Normocephalic, atraumatic, moist mucus membranes EYES: Conjunctivae  are normal.  No scleral icterus.  NECK: Normal range of motion, supple SKIN: Skin is warm and dry. No rash noted. Not diaphoretic. No erythema. No pallor. NEUROLGIC: Alert and oriented to person, place, and time. No focal defects PSYCHIATRIC: Normal mood and affect. Normal behavior. Normal judgment and thought content. CARDIOVASCULAR: Normal heart rate noted, regular rhythm RESPIRATORY: No increased work of breathing. Stable on room air. LCTAB ABDOMEN: Soft, tender to palpation in the mid-epigastric as well as right and left upper quadrants, worse on the right, nondistended, gravid. No rebound or guarding. Negative Murphy's sign.  MUSCULOSKELETAL: Normal range of motion. No edema and no tenderness. 2+ distal pulses.  Cervix:Dilation: 0cm thick and high Membranes:intact Fetal Monitoring:Baseline: 140s bpm Tocometer: a couple of contractions noted on the monitor, but patient not feeling them  Labs:  Results for orders placed or performed during the hospital encounter of 10/18/16 (from the past 24 hour(s))  Urinalysis, Routine w reflex microscopic   Collection Time: 10/18/16  5:10 AM  Result Value Ref Range   Color, Urine YELLOW YELLOW   APPearance HAZY (A) CLEAR   Specific Gravity, Urine 1.017 1.005 - 1.030   pH 7.0 5.0 - 8.0   Glucose, UA NEGATIVE NEGATIVE mg/dL   Hgb urine dipstick NEGATIVE NEGATIVE   Bilirubin Urine NEGATIVE NEGATIVE   Ketones, ur 5 (A) NEGATIVE mg/dL   Protein, ur NEGATIVE NEGATIVE mg/dL   Nitrite NEGATIVE NEGATIVE   Leukocytes, UA TRACE (A) NEGATIVE   RBC / HPF 0-5 0 - 5 RBC/hpf   WBC, UA 6-30 0 - 5  WBC/hpf   Bacteria, UA RARE (A) NONE SEEN   Squamous Epithelial / LPF 6-30 (A) NONE SEEN   Mucous PRESENT   CBC with Differential/Platelet   Collection Time: 10/18/16  6:10 AM  Result Value Ref Range   WBC 14.7 (H) 4.0 - 10.5 K/uL   RBC 4.75 3.87 - 5.11 MIL/uL   Hemoglobin 10.8 (L) 12.0 - 15.0 g/dL   HCT 16.1 (L) 09.6 - 04.5 %   MCV 69.9 (L) 78.0 - 100.0 fL   MCH 22.7 (L) 26.0 - 34.0 pg   MCHC 32.5 30.0 - 36.0 g/dL   RDW 40.9 81.1 - 91.4 %   Platelets 381 150 - 400 K/uL   Neutrophils Relative %  85 %   Neutro Abs 12.5 (H) 1.7 - 7.7 K/uL   Lymphocytes Relative 11 %   Lymphs Abs 1.6 0.7 - 4.0 K/uL   Monocytes Relative 3 %   Monocytes Absolute 0.4 0.1 - 1.0 K/uL   Eosinophils Relative 1 %   Eosinophils Absolute 0.2 0.0 - 0.7 K/uL   Basophils Relative 0 %   Basophils Absolute 0.0 0.0 - 0.1 K/uL  Comprehensive metabolic panel   Collection Time: 10/18/16  6:10 AM  Result Value Ref Range   Sodium 134 (L) 135 - 145 mmol/L   Potassium 4.5 3.5 - 5.1 mmol/L   Chloride 103 101 - 111 mmol/L   CO2 21 (L) 22 - 32 mmol/L   Glucose, Bld 148 (H) 65 - 99 mg/dL   BUN 7 6 - 20 mg/dL   Creatinine, Ser 1.30 0.44 - 1.00 mg/dL   Calcium 9.0 8.9 - 86.5 mg/dL   Total Protein 6.9 6.5 - 8.1 g/dL   Albumin 3.2 (L) 3.5 - 5.0 g/dL   AST 784 (H) 15 - 41 U/L   ALT 70 (H) 14 - 54 U/L   Alkaline Phosphatase 78 38 - 126 U/L   Total Bilirubin 1.7 (H) 0.3 - 1.2 mg/dL   GFR calc non Af Amer >60 >60 mL/min   GFR calc Af Amer >60 >60 mL/min   Anion gap 10 5 - 15  Lipase, blood   Collection Time: 10/18/16  6:10 AM  Result Value Ref Range   Lipase 22 11 - 51 U/L    Imaging Studies: US Abdomen Limited  Result Date: 10/18/2016 CLINICAL DATA:  Abdominal pain. EXAM: US ABDOMEN LIMITED - RIGHT UPPER QUADRANT COMPARISON:  Ultrasound 09/24/2016. FINDINGS: Gallbladder: Multiple gallstones.  Sludge.  Gallbladder wall thickness 1.7 mm. Common bile duct: Diameter: 4.7 mm Liver: Heterogeneous parenchymal pattern  consistent with fatty infiltration and/or hepatocellular disease. IMPRESSION: 1. Gallstones and sludge. No gallbladder wall thickening. No biliary distention. 2. Heterogeneous hepatic parenchymal pattern consistent with fatty infiltration oral and or hepatocellular disease. Electronically Signed   By: Maisie Fus  Register   On: 10/18/2016 07:18   [place] Mfm Ob Transvaginal  Result Date: 09/24/2016 ----------------------------------------------------------------------  OBSTETRICS REPORT                      (Signed Final 09/24/2016 09:10 am) ---------------------------------------------------------------------- Patient Info  ID #:       [id number]                         D.O.B.:   [birth year] (20 yrs)  Name:       Connie Y Alcivar                 Visit Date:  09/24/2016 08:00 am ---------------------------------------------------------------------- Performed By  Performed By:     Jodean Lima          Secondary Phy.:   Hermina Staggers                    RDMS                                                             MD  Attending:        Particia Nearing MD  Address:          [address]                                                             [address]                                                             [place], [place]                                                             [id number]  Referred By:      [facility]       [facility]         [facility]                    [facility]                    [facility]                    [facility]  Ref. Address:     [facility]                    OB[facility]                    [address]                    [place], [place]                    [id number] ---------------------------------------------------------------------- Orders   #  Description                                 Code   1  US MFM OB TRANSVAGINAL                      [id number]   2  US MFM OB FOLLOW UP                         [id number]   ----------------------------------------------------------------------   #  Ordered By               Order #        Accession #    Episode #   1  Alpha GulaPAUL WHITECAR            [id number]      [id number]     [id number]   2  MARTHA DECKER            [id number]      [id number]     [id number]  ----------------------------------------------------------------------  Indications   [redacted] weeks gestation of pregnancy                Z3A.24   Poor obstetric history: Previous preterm       O09.219   delivery, antepartum (33wks); 17P   Poor obstetric history: Previous gestational   O09.299   diabetes   Gestational diabetes in pregnancy,             O24.415   controlled by oral hypoglycemic drugs; on   glyburide   Obesity complicating pregnancy, second         O99.212   trimester  ---------------------------------------------------------------------- OB History  Blood Type:            Height:  4'7"   Weight (lb):  149      BMI:   34.63  Gravidity:    3         Prem:   1         SAB:   1  Living:       1 ---------------------------------------------------------------------- Fetal Evaluation  Num Of Fetuses:     1  Fetal Heart         137  Rate(bpm):  Cardiac Activity:   Observed  Presentation:       Variable  Placenta:           Anterior, above cervical os  P. Cord Insertion:  Visualized, central  Amniotic Fluid  AFI FV:      Subjectively within normal limits                              Largest Pocket(cm)                              5.74 ---------------------------------------------------------------------- Biometry  BPD:      59.9  mm     G. Age:  24w 3d         60  %    CI:        74.07   %   70 - 86                                                          FL/HC:      19.1   %   18.7 - 20.9  HC:       221   mm     G. Age:  24w 1d         38  %    HC/AC:      1.09       1.05 - 1.21  AC:      203.4  mm     G. Age:  25w 0d         71  %    FL/BPD:     70.6   %   71 - 87  FL:       42.3  mm     G. Age:  23w 6d         31  %    FL/AC:      20.8    %   20 - 24  Est. FW:     695  gm      1 lb 9 oz     60  % ---------------------------------------------------------------------- Gestational Age  LMP:           25w 3d       Date:   03/30/16                 EDD:   01/04/17  U/S Today:     24w 3d                                        EDD:   01/11/17  Best:          24w 0d    Det. ByMarcella Dubs         EDD:   01/14/17                                      (05/22/16) ---------------------------------------------------------------------- Anatomy  Cranium:               Appears normal         Aortic Arch:            Previously seen  Cavum:                 Appears normal         Ductal Arch:            Previously seen  Ventricles:            Appears normal         Diaphragm:              Appears normal  Choroid Plexus:        Previously seen        Stomach:                Appears normal, left                                                                        sided  Cerebellum:            Previously seen        Abdomen:                Appears normal  Posterior Fossa:       Previously seen        Abdominal Wall:         Previously seen  Nuchal Fold:           Previously seen        Cord Vessels:           Previously seen  Face:                  Orbits and profile     Kidneys:                Appear normal                         previously seen  Lips:                  Appears normal         Bladder:                Appears normal  Thoracic:              Appears normal         Spine:                  Previously seen  Heart:                 Appears normal         Upper Extremities:      Previously seen                         (4CH, axis, and situs  RVOT:                  Appears normal         Lower Extremities:      Previously seen  LVOT:                  Appears normal  Other:  Fetus appears to be a female.Technically difficult due to maternal          habitus and fetal position. ---------------------------------------------------------------------- Cervix  Uterus Adnexa  Cervix  Length:           4.14  cm.  Normal appearance by transvaginal scan  Uterus  No abnormality visualized.  Left Ovary  Within normal limits.  Right Ovary  Within normal limits.  Adnexa:       No abnormality visualized. ---------------------------------------------------------------------- Impression  SIUP at 24+0 weeks  Normal interval anatomy; anatomic survey complete  Normal amniotic fluid volume  Appropriate interval growth with EFW at the 60th %tile  EV views of cervix: normal length (4.1 cms) without funneling ---------------------------------------------------------------------- Recommendations  Follow-up ultrasound for growth in 4 weeks (GDM on oral  meds) ----------------------------------------------------------------------                 Particia Nearing, MD Electronically Signed Final Report   09/24/2016 09:10 am ----------------------------------------------------------------------  [place] Mfm Ob Follow Up  Result Date: 09/24/2016 ----------------------------------------------------------------------  OBSTETRICS REPORT                      (Signed Final 09/24/2016 09:10 am) ---------------------------------------------------------------------- Patient Info  ID #:       [id number]                         D.O.B.:   Apr 01, 1996 (20 yrs)  Name:       Connie Y Dieguez                 Visit Date:  09/24/2016 08:00 am ---------------------------------------------------------------------- Performed By  Performed By:     Jodean Lima          Secondary Phy.:   Hermina Staggers                    RDMS  MD  Attending:        Particia Nearing MD       Address:          [address]                                                             [place], [place]                                                             [address]  Referred By:      [facility]       Location:          [facility] for                    [facility]                    Healthcare  Ref. Address:     [facility]                    [address]                    [place], [place]                    [address] ---------------------------------------------------------------------- Orders   #  Description                                 Code   1  [place] [facility] TRANSVAGINAL                      [id number]   2  [place] MFM OB FOLLOW UP                         [id number]  ----------------------------------------------------------------------   #  Ordered By               Order #        Accession #    Episode #   1  Alpha Gula            [id number]      [id number]     [id number]   2  MARTHA DECKER  [id number]      [id number]     [id number]  ---------------------------------------------------------------------- Indications   [redacted] weeks gestation of pregnancy                Z3A.24   Poor obstetric history: Previous preterm       O09.219   delivery, antepartum (33wks); 17P   Poor obstetric history: Previous gestational   O09.299   diabetes   Gestational diabetes in pregnancy,             O24.415   controlled by oral hypoglycemic drugs; on   glyburide   Obesity complicating pregnancy, second         O99.212   trimester  ---------------------------------------------------------------------- OB History  Blood Type:            Height:  4'7"   Weight (lb):  149      BMI:   34.63  Gravidity:    3         Prem:   1         SAB:   1  Living:       1 ---------------------------------------------------------------------- Fetal Evaluation  Num Of Fetuses:     1  Fetal Heart         137  Rate(bpm):  Cardiac Activity:   Observed  Presentation:       Variable  Placenta:           Anterior, above cervical os  P. Cord Insertion:  Visualized, central  Amniotic Fluid  AFI FV:      Subjectively within normal limits                              Largest Pocket(cm)                               5.74 ---------------------------------------------------------------------- Biometry  BPD:      59.9  mm     G. Age:  24w 3d         60  %    CI:        74.07   %   70 - 86                                                          FL/HC:      19.1   %   18.7 - 20.9  HC:       221   mm     G. Age:  24w 1d         38  %    HC/AC:      1.09       1.05 - 1.21  AC:      203.4  mm     G. Age:  25w 0d         71  %    FL/BPD:     70.6   %   71 - 87  FL:       42.3  mm     G. Age:  23w 6d         31  %    FL/AC:      20.8   %  20 - 24  Est. FW:     695  gm      1 lb 9 oz     60  % ---------------------------------------------------------------------- Gestational Age  LMP:           25w 3d       Date:   03/30/16                 EDD:   01/04/17  U/S Today:     24w 3d                                        EDD:   01/11/17  Best:          24w 0d    Det. ByMarcella Dubs         EDD:   01/14/17                                      (05/22/16) ---------------------------------------------------------------------- Anatomy  Cranium:               Appears normal         Aortic Arch:            Previously seen  Cavum:                 Appears normal         Ductal Arch:            Previously seen  Ventricles:            Appears normal         Diaphragm:              Appears normal  Choroid Plexus:        Previously seen        Stomach:                Appears normal, left                                                                        sided  Cerebellum:            Previously seen        Abdomen:                Appears normal  Posterior Fossa:       Previously seen        Abdominal Wall:         Previously seen  Nuchal Fold:           Previously seen        Cord Vessels:           Previously seen  Face:                  Orbits and profile     Kidneys:                Appear normal  previously seen  Lips:                  Appears normal         Bladder:                Appears normal   Thoracic:              Appears normal         Spine:                  Previously seen  Heart:                 Appears normal         Upper Extremities:      Previously seen                         (4CH, axis, and situs  RVOT:                  Appears normal         Lower Extremities:      Previously seen  LVOT:                  Appears normal  Other:  Fetus appears to be a female.Technically difficult due to maternal          habitus and fetal position. ---------------------------------------------------------------------- Cervix Uterus Adnexa  Cervix  Length:           4.14  cm.  Normal appearance by transvaginal scan  Uterus  No abnormality visualized.  Left Ovary  Within normal limits.  Right Ovary  Within normal limits.  Adnexa:       No abnormality visualized. ---------------------------------------------------------------------- Impression  SIUP at 24+0 weeks  Normal interval anatomy; anatomic survey complete  Normal amniotic fluid volume  Appropriate interval growth with EFW at the 60th %tile  EV views of cervix: normal length (4.1 cms) without funneling ---------------------------------------------------------------------- Recommendations  Follow-up ultrasound for growth in 4 weeks (GDM on oral  meds) ----------------------------------------------------------------------                 Particia Nearing, MD Electronically Signed Final Report   09/24/2016 09:10 am ----------------------------------------------------------------------    Assessment and Plan: Patient Active Problem List   Diagnosis Date Noted  . Gallstones 10/18/2016  . Pregnancy related hip pain, antepartum 10/16/2016  . Supervision of normal pregnancy, antepartum 07/10/2016  . H/O preterm delivery, currently pregnant 07/10/2016  . Gestational diabetes 03/10/2014  . ANEMIA, OTHER, UNSPECIFIED 11/13/2006   This is a 21 yo G3P0111 at 27+3 weeks who presented with mid-epigastric and RUQ abdominal pain, now with elevated LFTs and  ultrasound showing biliary sludge, gallstones and fatty infiltration of the liver. Concern for acute cholecystitis.   IV analgesics Surgery consult. May potentially require GI consult for ERCP if concern for acute choledocholithiaisis.  NPO No antibiotics at this time as patient is afebrile with only mild leukocytosis Admit to Antenatal Routine antenatal care otherwise. No concern for PTL at this time.    Gorden Harms, MD PGY-2

## 2016-10-18 NOTE — MAU Note (Signed)
Patient back from US c/o pain now radiating from upper abdomen to lower and same for back, rates pain 10/10. Notified Cathie BeamsFran Cresenzo-Dishmon CNM was told to have MAU provider see patient as they are unavailable notified Vonzella NippleJulie Wenzel PA

## 2016-10-18 NOTE — Progress Notes (Signed)
Pt now c/o pain more in upper mid of abdomen. Continues to have pain in RUQ. Nursing will monitor. Sheryn BisonGordon, Robby Bulkley Warner

## 2016-10-18 NOTE — Consult Note (Signed)
[facility] Surgery Consult Note  [employer] 1996-05-24  [id number].    Requesting MD: Megan Salon Chief Complaint/Reason for Consult: Cholelithiasis HPI:  Connie White is a 21yo female 70w3dpregnant with GDM, [facility] onset upper abdominal pain. States that the pain woke her from her sleep late last night. It was constant and unrelenting, therefore decide to come to the ED. She reports some nausea and 1 episode of emesis since admission. Denies fever, chills, diarrhea, constipation, or dysuria. Her pain is now more located in the RUQ. Pain medication helps; when the medicine wears off she does still have pain, but it is less severe than when she came in. Her last meal was cereal last night for dinner. States that she has never had pain like this before.  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   Respiratory: Negative.   Cardiovascular: Negative.   Gastrointestinal: Positive for abdominal pain, nausea and vomiting. Negative for constipation and diarrhea.  Genitourinary: Negative.   Musculoskeletal: Negative.   Skin: Negative.   Neurological: Negative.    All systems reviewed and otherwise negative except for as above  Family History  Problem Relation Age of Onset  . Diabetes Mother     Past Medical History:  Diagnosis Date  . Gestational diabetes     Past Surgical History:  Procedure Laterality Date  . NO PAST SURGERIES      Social History:  reports that she has quit smoking. She has never used smokeless tobacco. She reports that she does not drink alcohol or use drugs.  Allergies: No Known Allergies  Facility-Administered Medications Prior to Admission  Medication Dose  Route Frequency Provider Last Rate Last Dose  . hydroxyprogesterone caproate (MAKENA) 250 mg/mL injection 250 mg  250 mg Intramuscular Weekly JaWoodroe ModeMD   250 mg at 10/16/16 0945   Medications Prior to Admission  Medication Sig Dispense Refill  . acetaminophen (TYLENOL) 500 MG tablet Take 1,000 mg by mouth every 6 (six) hours as needed for mild pain or headache.    . glyBURIDE (DIABETA) 2.5 MG tablet Take 5 mg by mouth at bedtime.    . ondansetron (ZOFRAN ODT) 4 MG disintegrating tablet Take 1 tablet (4 mg total) by mouth every 8 (eight) hours as needed for nausea or vomiting. 20 tablet 0  . pantoprazole (PROTONIX) 40 MG tablet Take 1 tablet (40 mg total) by mouth daily. 30 tablet 5  . Prenatal Vit-Fe Phos-FA-Omega (VITAFOL GUMMIES) 3.33-0.333-34.8 MG CHEW Chew 3 tablets by mouth daily before breakfast. 90 tablet 11  . ACCU-CHEK FASTCLIX LANCETS MISC TEST four times a day 102 each 12  . ACCU-CHEK SMARTVIEW test strip TEST four times a day FASTING AND 2 HOURS AFTER EACH MEAL 100 each 12  . cyclobenzaprine (FLEXERIL) 10 MG tablet Take 1 tablet (10 mg total) by mouth every 8 (eight) hours as needed for muscle spasms. (Patient not taking: Reported on 10/18/2016) 30 tablet 1    Prior to Admission medications   Medication Sig Start Date End Date Taking? Authorizing Provider  acetaminophen (TYLENOL) 500 MG tablet Take 1,000 mg by mouth every 6 (six) hours as needed for mild pain or headache.   Yes Historical  Provider, MD  glyBURIDE (DIABETA) 2.5 MG tablet Take 5 mg by mouth at bedtime.   Yes Historical Provider, MD  ondansetron (ZOFRAN ODT) 4 MG disintegrating tablet Take 1 tablet (4 mg total) by mouth every 8 (eight) hours as needed for nausea or vomiting. 09/06/16  Yes Julianne Handler, CNM  pantoprazole (PROTONIX) 40 MG tablet Take 1 tablet (40 mg total) by mouth daily. 08/20/16 08/20/17 Yes Woodroe Mode, MD  Prenatal Vit-Fe Phos-FA-Omega (VITAFOL GUMMIES) 3.33-0.333-34.8 MG CHEW Chew 3 tablets  by mouth daily before breakfast. 06/12/16  Yes Shelly Bombard, MD  ACCU-CHEK FASTCLIX LANCETS MISC TEST four times a day 10/14/16   Woodroe Mode, MD  ACCU-CHEK SMARTVIEW test strip TEST four times a day FASTING AND 2 HOURS AFTER [facility] MEAL 08/27/16   Peggy Constant, MD  cyclobenzaprine (FLEXERIL) 10 MG tablet Take 1 tablet (10 mg total) by mouth every 8 (eight) hours as needed for muscle spasms. Patient not taking: Reported on 10/18/2016 10/16/16   Chancy Milroy, MD    Blood pressure 119/64, pulse 99, temperature 97.6 F (36.4 C), temperature source Oral, resp. rate 20, height 4' 7.5" (1.41 m), weight 158 lb (71.7 kg), last menstrual period 03/30/2016, SpO2 96 %. Physical Exam: General: pleasant, WD/WN female who is laying in bed in NAD HEENT: head is normocephalic, atraumatic.  Sclera are noninjected.  Mouth is dry Heart: regular, rate, and rhythm.  No obvious murmurs, gallops, or rubs noted.  Palpable pedal pulses bilaterally Lungs: CTAB, no wheezes, rhonchi, or rales noted.  Respiratory effort nonlabored Abd: gravid, soft, ND, +BS, TTP RUQ/epigastrium with +murphy sign; no rebound or signs of peritonitis MS: all 4 extremities are symmetrical with no cyanosis, clubbing, or edema. Skin: warm and dry with no masses, lesions, or rashes Psych: A&Ox3 with an appropriate affect. Neuro: CM 2-12 intact, extremity CSM intact bilaterally, normal speech  Results for orders placed or performed during the hospital encounter of 10/18/16 (from the past 48 hour(s))  Urinalysis, Routine w reflex microscopic     Status: Abnormal   Collection Time: 10/18/16  5:10 AM  Result Value Ref Range   Color, Urine YELLOW YELLOW   APPearance HAZY (A) CLEAR   Specific Gravity, Urine 1.017 1.005 - 1.030   pH 7.0 5.0 - 8.0   Glucose, UA NEGATIVE NEGATIVE mg/dL   Hgb urine dipstick NEGATIVE NEGATIVE   Bilirubin Urine NEGATIVE NEGATIVE   Ketones, ur 5 (A) NEGATIVE mg/dL   Protein, ur NEGATIVE NEGATIVE mg/dL    Nitrite NEGATIVE NEGATIVE   Leukocytes, UA TRACE (A) NEGATIVE   RBC / HPF 0-5 0 - 5 RBC/hpf   WBC, UA 6-30 0 - 5 WBC/hpf   Bacteria, UA RARE (A) NONE SEEN   Squamous Epithelial / LPF 6-30 (A) NONE SEEN   Mucous PRESENT   CBC with Differential/Platelet     Status: Abnormal   Collection Time: 10/18/16  6:10 AM  Result Value Ref Range   WBC 14.7 (H) 4.0 - 10.5 K/uL   RBC 4.75 3.87 - 5.11 MIL/uL   Hemoglobin 10.8 (L) 12.0 - 15.0 g/dL   HCT 33.2 (L) 36.0 - 46.0 %   MCV 69.9 (L) 78.0 - 100.0 fL   MCH 22.7 (L) 26.0 - 34.0 pg   MCHC 32.5 30.0 - 36.0 g/dL   RDW 13.8 11.5 - 15.5 %   Platelets 381 150 - 400 K/uL   Neutrophils Relative % 85 %   Neutro Abs 12.5 (H) 1.7 - 7.7 K/uL  Lymphocytes Relative 11 %   Lymphs Abs 1.6 0.7 - 4.0 K/uL   Monocytes Relative 3 %   Monocytes Absolute 0.4 0.1 - 1.0 K/uL   Eosinophils Relative 1 %   Eosinophils Absolute 0.2 0.0 - 0.7 K/uL   Basophils Relative 0 %   Basophils Absolute 0.0 0.0 - 0.1 K/uL  Comprehensive metabolic panel     Status: Abnormal   Collection Time: 10/18/16  6:10 AM  Result Value Ref Range   Sodium 134 (L) 135 - 145 mmol/L   Potassium 4.5 3.5 - 5.1 mmol/L   Chloride 103 101 - 111 mmol/L   CO2 21 (L) 22 - 32 mmol/L   Glucose, Bld 148 (H) 65 - 99 mg/dL   BUN 7 6 - 20 mg/dL   Creatinine, Ser 0.47 0.44 - 1.00 mg/dL   Calcium 9.0 8.9 - 10.3 mg/dL   Total Protein 6.9 6.5 - 8.1 g/dL   Albumin 3.2 (L) 3.5 - 5.0 g/dL   AST 112 (H) 15 - 41 U/L   ALT 70 (H) 14 - 54 U/L   Alkaline Phosphatase 78 38 - 126 U/L   Total Bilirubin 1.7 (H) 0.3 - 1.2 mg/dL   GFR calc non Af Amer >60 >60 mL/min   GFR calc Af Amer >60 >60 mL/min    Comment: (NOTE) The eGFR has been calculated using the CKD EPI equation. This calculation has not been validated in all clinical situations. eGFR's persistently <60 mL/min signify possible Chronic Kidney Disease.    Anion gap 10 5 - 15  Lipase, blood     Status: None   Collection Time: 10/18/16  6:10 AM  Result  Value Ref Range   Lipase 22 11 - 51 U/L  Type and screen [place]     Status: None   Collection Time: 10/18/16  8:09 AM  Result Value Ref Range   ABO/RH(D) B POS    Antibody Screen NEG    Sample Expiration 10/21/2016   Glucose, capillary     Status: Abnormal   Collection Time: 10/18/16  9:27 AM  Result Value Ref Range   Glucose-Capillary 116 (H) 65 - 99 mg/dL  Glucose, capillary     Status: Abnormal   Collection Time: 10/18/16 11:58 AM  Result Value Ref Range   Glucose-Capillary 105 (H) 65 - 99 mg/dL   US Abdomen Limited  Result Date: 10/18/2016 CLINICAL DATA:  Abdominal pain. EXAM: US ABDOMEN LIMITED - RIGHT UPPER QUADRANT COMPARISON:  Ultrasound 09/24/2016. FINDINGS: Gallbladder: Multiple gallstones.  Sludge.  Gallbladder wall thickness 1.7 mm. Common bile duct: Diameter: 4.7 mm Liver: Heterogeneous parenchymal pattern consistent with fatty infiltration and/or hepatocellular disease. IMPRESSION: 1. Gallstones and sludge. No gallbladder wall thickening. No biliary distention. 2. Heterogeneous hepatic parenchymal pattern consistent with fatty infiltration oral and or hepatocellular disease. Electronically Signed   By: Marcello Moores  Register   On: 10/18/2016 07:18      Assessment/Plan Cholelithiasis, possible cholecystitis, possible choledocholithiasis - admitted 10/18/16 with acute onset RUQ pain, never had pain like this before - RUQ u/s showed gallstones and sludge, no gallbladder wall thickening, no biliary distention - mild transaminitis AST 112/ ALT 70, hyperbilirubinemia at 1.7 - WBC 14.7, afebrile - + murphy on exam - pain somewhat improved since admission  Pregnant - [redacted]w[redacted]d Gestational diabetes - on gliburide at home, SSI here Anemia - Hg 10.8  ID - rocephin VTE - SCDs FEN - IVF, clears, NPO after midnight  Plan - U/s shows gallstones  but no definite cholecystitis. She has mild transaminitis and hyperbilirubinemia. Concern for possible cholecystitis and  possible choledocholithiasis. Unable to perform HIDA due to patient being pregnant. Would recommend starting rocephin today, and recheck CBC/CMP in [place] for clears today, NPO after midnight. If labs and abdominal exam improve could likely discharge and follow-up outpatient to discuss cholecystectomy. If LFTs still elevated and patient continues to have pain, may have to discuss surgical intervention during this admission.  Jerrye Beavers, [facility] Surgery 10/18/2016, 1:11 PM Pager: [phone] Consults: [phone] Mon-Fri 7:00 am-4:30 pm Sat-Sun 7:00 am-11:30 am

## 2016-10-18 NOTE — Plan of Care (Signed)
Problem: Safety: Goal: Ability to remain free from injury will improve Outcome: Completed/Met Date Met: 10/18/16 Pt is ambulatory and safe.  Problem: Health Behavior/Discharge Planning: Goal: Ability to manage health-related needs will improve Outcome: Progressing Pt manages herself well, no questions about plan of care at this time.   Problem: Pain Managment: Goal: General experience of comfort will improve Outcome: Progressing Pain is well controlled at this time.

## 2016-10-18 NOTE — Progress Notes (Addendum)
Assumed care of pt at this time. Assumption of care delayed dt urgent matter with another patient. EFM applied.   2330: reminded pt not to eat or drink anything dt possible surgery next day. Pt verbalized understanding.   2345: drinks cleared off tables.   2207: CBG 92 0037: CBG 102  0350: new bag of LR up.  0502: CBG 102  0530: Pt called out for pain medication. Pain 9/10 upper abdominal area.

## 2016-10-18 NOTE — MAU Provider Note (Signed)
History     CSN: [id number]  Arrival date and time: 10/18/16 4098   First Provider Initiated Contact with Patient 10/18/16 0543      Chief Complaint  Patient presents with  . Abdominal Pain   HPI Connie White is a 21 y.o. [id number] at [redacted]w[redacted]d who presents to MAU today with complaint of RUQ pain since 0200 today. The patient states that pain is constant and sharp in nature. She has not taken anything for pain. She states that pain radiates into her mid back. She denies dysuria, vaginal bleeding, LOF or regular contractions. She does have a history of PPROM at 33 weeks with a previous pregnancy and GDM with this pregnancy. She reports good fetal movement.    OB History    Gravida Para Term Preterm AB Living   3 1 0 1 1 1    SAB TAB Ectopic Multiple Live Births   1 0 0 0 1      Past Medical History:  Diagnosis Date  . Gestational diabetes     Past Surgical History:  Procedure Laterality Date  . NO PAST SURGERIES      Family History  Problem Relation Age of Onset  . Diabetes Mother     Social History  Substance Use Topics  . Smoking status: Former Games developer  . Smokeless tobacco: Never Used  . Alcohol use No    Allergies: No Known Allergies  Facility-Administered Medications Prior to Admission  Medication Dose Route Frequency Provider Last Rate Last Dose  . hydroxyprogesterone caproate (MAKENA) 250 mg/mL injection 250 mg  250 mg Intramuscular Weekly Adam Phenix, MD   250 mg at 10/16/16 0945   Prescriptions Prior to Admission  Medication Sig Dispense Refill Last Dose  . ACCU-CHEK FASTCLIX LANCETS MISC TEST four times a day 102 each 12 Taking  . ACCU-CHEK SMARTVIEW test strip TEST four times a day FASTING AND 2 HOURS AFTER EACH MEAL 100 each 12 Taking  . cyclobenzaprine (FLEXERIL) 10 MG tablet Take 1 tablet (10 mg total) by mouth every 8 (eight) hours as needed for muscle spasms. 30 tablet 1   . glyBURIDE (DIABETA) 5 MG tablet Take 1 tablet (5 mg total) by mouth at  bedtime. 30 tablet 3   . ondansetron (ZOFRAN ODT) 4 MG disintegrating tablet Take 1 tablet (4 mg total) by mouth every 8 (eight) hours as needed for nausea or vomiting. 20 tablet 0 Taking  . pantoprazole (PROTONIX) 40 MG tablet Take 1 tablet (40 mg total) by mouth daily. 30 tablet 5 Taking  . Prenatal Vit-Fe Phos-FA-Omega (VITAFOL GUMMIES) 3.33-0.333-34.8 MG CHEW Chew 3 tablets by mouth daily before breakfast. 90 tablet 11 Taking    Review of Systems  Constitutional: Negative for fever.  Gastrointestinal: Positive for abdominal pain and nausea. Negative for constipation, diarrhea and vomiting.  Genitourinary: Negative for dysuria, flank pain, frequency, urgency, vaginal bleeding and vaginal discharge.   Physical Exam   Blood pressure 106/57, pulse 92, temperature 98.2 F (36.8 C), temperature source Oral, resp. rate 19, height 4' 7.5" (1.41 m), weight 158 lb (71.7 kg), last menstrual period 03/30/2016.  Physical Exam  Nursing note and vitals reviewed. Constitutional: She is oriented to person, place, and time. She appears well-developed and well-nourished. No distress.  HENT:  Head: Normocephalic and atraumatic.  Cardiovascular: Normal rate.   Respiratory: Effort normal.  GI: Soft. She exhibits no distension and no mass. There is tenderness (mild to moderate tenderness to palpation of the RUQ and epigastric  region). There is no rebound and no guarding.  Neurological: She is alert and oriented to person, place, and time.  Skin: Skin is warm and dry. No erythema.  Psychiatric: She has a normal mood and affect.  Dilation: Closed Effacement (%): Thick Exam by:: Vonzella Nipple PA  Results for orders placed or performed during the hospital encounter of 10/18/16 (from the past 24 hour(s))  Urinalysis, Routine w reflex microscopic     Status: Abnormal   Collection Time: 10/18/16  5:10 AM  Result Value Ref Range   Color, Urine YELLOW YELLOW   APPearance HAZY (A) CLEAR   Specific Gravity,  Urine 1.017 1.005 - 1.030   pH 7.0 5.0 - 8.0   Glucose, UA NEGATIVE NEGATIVE mg/dL   Hgb urine dipstick NEGATIVE NEGATIVE   Bilirubin Urine NEGATIVE NEGATIVE   Ketones, ur 5 (A) NEGATIVE mg/dL   Protein, ur NEGATIVE NEGATIVE mg/dL   Nitrite NEGATIVE NEGATIVE   Leukocytes, UA TRACE (A) NEGATIVE   RBC / HPF 0-5 0 - 5 RBC/hpf   WBC, UA 6-30 0 - 5 WBC/hpf   Bacteria, UA RARE (A) NONE SEEN   Squamous Epithelial / LPF 6-30 (A) NONE SEEN   Mucous PRESENT   CBC with Differential/Platelet     Status: Abnormal   Collection Time: 10/18/16  6:10 AM  Result Value Ref Range   WBC 14.7 (H) 4.0 - 10.5 K/uL   RBC 4.75 3.87 - 5.11 MIL/uL   Hemoglobin 10.8 (L) 12.0 - 15.0 g/dL   HCT 16.1 (L) 09.6 - 04.5 %   MCV 69.9 (L) 78.0 - 100.0 fL   MCH 22.7 (L) 26.0 - 34.0 pg   MCHC 32.5 30.0 - 36.0 g/dL   RDW 40.9 81.1 - 91.4 %   Platelets 381 150 - 400 K/uL   Neutrophils Relative % 85 %   Neutro Abs 12.5 (H) 1.7 - 7.7 K/uL   Lymphocytes Relative 11 %   Lymphs Abs 1.6 0.7 - 4.0 K/uL   Monocytes Relative 3 %   Monocytes Absolute 0.4 0.1 - 1.0 K/uL   Eosinophils Relative 1 %   Eosinophils Absolute 0.2 0.0 - 0.7 K/uL   Basophils Relative 0 %   Basophils Absolute 0.0 0.0 - 0.1 K/uL  Comprehensive metabolic panel     Status: Abnormal   Collection Time: 10/18/16  6:10 AM  Result Value Ref Range   Sodium 134 (L) 135 - 145 mmol/L   Potassium 4.5 3.5 - 5.1 mmol/L   Chloride 103 101 - 111 mmol/L   CO2 21 (L) 22 - 32 mmol/L   Glucose, Bld 148 (H) 65 - 99 mg/dL   BUN 7 6 - 20 mg/dL   Creatinine, Ser 7.82 0.44 - 1.00 mg/dL   Calcium 9.0 8.9 - 95.6 mg/dL   Total Protein 6.9 6.5 - 8.1 g/dL   Albumin 3.2 (L) 3.5 - 5.0 g/dL   AST 213 (H) 15 - 41 U/L   ALT 70 (H) 14 - 54 U/L   Alkaline Phosphatase 78 38 - 126 U/L   Total Bilirubin 1.7 (H) 0.3 - 1.2 mg/dL   GFR calc non Af Amer >60 >60 mL/min   GFR calc Af Amer >60 >60 mL/min   Anion gap 10 5 - 15  Lipase, blood     Status: None   Collection Time: 10/18/16   6:10 AM  Result Value Ref Range   Lipase 22 11 - 51 U/L   US Abdomen Limited  Result Date:  10/18/2016 CLINICAL DATA:  Abdominal pain. EXAM: US ABDOMEN LIMITED - RIGHT UPPER QUADRANT COMPARISON:  Ultrasound 09/24/2016. FINDINGS: Gallbladder: Multiple gallstones.  Sludge.  Gallbladder wall thickness 1.7 mm. Common bile duct: Diameter: 4.7 mm Liver: Heterogeneous parenchymal pattern consistent with fatty infiltration and/or hepatocellular disease. IMPRESSION: 1. Gallstones and sludge. No gallbladder wall thickening. No biliary distention. 2. Heterogeneous hepatic parenchymal pattern consistent with fatty infiltration oral and or hepatocellular disease. Electronically Signed   By: Maisie Fushomas  Register   On: 10/18/2016 07:18    MAU Course  Procedures None  MDM UA, CBC, CMP, Lipase today  RUQ abdominal US  Discussed patient and results with Dr. Alysia PennaErvin. Admit for pain control and surgical consult. Orders placed.  Assessment and Plan  A: SIUP at 4577w3d Gallstones   P:  Admit for pain control Surgical consult NPO   Marny LowensteinJulie N Wenzel, PA-C  10/18/2016, 7:26 AM

## 2016-10-18 NOTE — MAU Note (Signed)
Presents with complaints of upper mid abdominal pain radiating around to her back that woke her up at 0000 this morning.  Rates pain 10/10 and has not taken anything for the pain.  Not feeling any CTX, reports good Fm, no VB or d/c.

## 2016-10-18 NOTE — Progress Notes (Signed)
OB Note [facility] called and they'd like to see her. Will start rocephin per their recs, in the interim.  Connie White Brodan Grewell, Jr MD Attending [facility] 10/18/2016 Time: 1240pm

## 2016-10-19 DIAGNOSIS — K831 Obstruction of bile duct: Secondary | ICD-10-CM

## 2016-10-19 DIAGNOSIS — R1011 Right upper quadrant pain: Secondary | ICD-10-CM

## 2016-10-19 DIAGNOSIS — Z3A27 27 weeks gestation of pregnancy: Secondary | ICD-10-CM

## 2016-10-19 DIAGNOSIS — R945 Abnormal results of liver function studies: Secondary | ICD-10-CM

## 2016-10-19 DIAGNOSIS — O26612 Liver and biliary tract disorders in pregnancy, second trimester: Secondary | ICD-10-CM

## 2016-10-19 LAB — GLUCOSE, CAPILLARY
GLUCOSE-CAPILLARY: 102 mg/dL — AB (ref 65–99)
Glucose-Capillary: 102 mg/dL — ABNORMAL HIGH (ref 65–99)
Glucose-Capillary: 102 mg/dL — ABNORMAL HIGH (ref 65–99)
Glucose-Capillary: 87 mg/dL (ref 65–99)
Glucose-Capillary: 99 mg/dL (ref 65–99)

## 2016-10-19 LAB — COMPREHENSIVE METABOLIC PANEL
ALK PHOS: 83 U/L (ref 38–126)
ALT: 90 U/L — AB (ref 14–54)
AST: 61 U/L — AB (ref 15–41)
Albumin: 2.9 g/dL — ABNORMAL LOW (ref 3.5–5.0)
Anion gap: 9 (ref 5–15)
BILIRUBIN TOTAL: 2.4 mg/dL — AB (ref 0.3–1.2)
BUN: 6 mg/dL (ref 6–20)
CALCIUM: 9 mg/dL (ref 8.9–10.3)
CHLORIDE: 103 mmol/L (ref 101–111)
CO2: 23 mmol/L (ref 22–32)
CREATININE: 0.49 mg/dL (ref 0.44–1.00)
Glucose, Bld: 107 mg/dL — ABNORMAL HIGH (ref 65–99)
Potassium: 4.2 mmol/L (ref 3.5–5.1)
Sodium: 135 mmol/L (ref 135–145)
Total Protein: 6.2 g/dL — ABNORMAL LOW (ref 6.5–8.1)

## 2016-10-19 LAB — CBC
HEMATOCRIT: 31.2 % — AB (ref 36.0–46.0)
HEMOGLOBIN: 9.9 g/dL — AB (ref 12.0–15.0)
MCH: 22.3 pg — AB (ref 26.0–34.0)
MCHC: 31.7 g/dL (ref 30.0–36.0)
MCV: 70.4 fL — AB (ref 78.0–100.0)
PLATELETS: 345 10*3/uL (ref 150–400)
RBC: 4.43 MIL/uL (ref 3.87–5.11)
RDW: 13.7 % (ref 11.5–15.5)
WBC: 9.7 10*3/uL (ref 4.0–10.5)

## 2016-10-19 MED ORDER — HYDROMORPHONE HCL 1 MG/ML IJ SOLN
1.0000 mg | INTRAMUSCULAR | Status: DC | PRN
  Administered 2016-10-19 – 2016-10-23 (×26): 1 mg via INTRAVENOUS
  Filled 2016-10-19 (×27): qty 1

## 2016-10-19 MED ORDER — PANTOPRAZOLE SODIUM 40 MG PO TBEC
40.0000 mg | DELAYED_RELEASE_TABLET | Freq: Every day | ORAL | Status: DC
  Administered 2016-10-19 – 2016-10-23 (×4): 40 mg via ORAL
  Filled 2016-10-19 (×4): qty 1

## 2016-10-19 NOTE — Progress Notes (Signed)
Called MD to inquiry about plan of care for balance of day, post Dr. Ermalene SearingMartin's progress note, awaiting call return.

## 2016-10-19 NOTE — Progress Notes (Signed)
Dr. Martin at bedside

## 2016-10-19 NOTE — Progress Notes (Signed)
Patient ID: Connie White, female   DOB: 12-16-1995, 21 y.o.   MRN: [id number] [facility] Surgery Progress Note:   * No surgery found *  Subjective: Mental status is clear.  Pain is relieved with Dilaudid.  Pain is at costal margin and radiating into her back Objective: Vital signs in last 24 hours: Temp:  [98.4 F (36.9 C)-98.5 F (36.9 C)] 98.4 F (36.9 C) (02/03 0833) Pulse Rate:  [84-92] 84 (02/03 0833) Resp:  [16-18] 18 (02/03 0833) BP: (106-107)/(52-60) 107/52 (02/03 0833) SpO2:  [95 %-100 %] 99 % (02/03 0833)  Intake/Output from previous day: 02/02 0701 - 02/03 0700 In: 500 [I.V.:500] Out: -  Intake/Output this shift: No intake/output data recorded.  Physical Exam: Work of breathing is not labored.  Complaining of pain near ribs on the right.   Lab Results:  Results for orders placed or performed during the hospital encounter of 10/18/16 (from the past 48 hour(s))  Urinalysis, Routine w reflex microscopic     Status: Abnormal   Collection Time: 10/18/16  5:10 AM  Result Value Ref Range   Color, Urine YELLOW YELLOW   APPearance HAZY (A) CLEAR   Specific Gravity, Urine 1.017 1.005 - 1.030   pH 7.0 5.0 - 8.0   Glucose, UA NEGATIVE NEGATIVE mg/dL   Hgb urine dipstick NEGATIVE NEGATIVE   Bilirubin Urine NEGATIVE NEGATIVE   Ketones, ur 5 (A) NEGATIVE mg/dL   Protein, ur NEGATIVE NEGATIVE mg/dL   Nitrite NEGATIVE NEGATIVE   Leukocytes, UA TRACE (A) NEGATIVE   RBC / HPF 0-5 0 - 5 RBC/hpf   WBC, UA 6-30 0 - 5 WBC/hpf   Bacteria, UA RARE (A) NONE SEEN   Squamous Epithelial / LPF 6-30 (A) NONE SEEN   Mucous PRESENT   CBC with Differential/Platelet     Status: Abnormal   Collection Time: 10/18/16  6:10 AM  Result Value Ref Range   WBC 14.7 (H) 4.0 - 10.5 K/uL   RBC 4.75 3.87 - 5.11 MIL/uL   Hemoglobin 10.8 (L) 12.0 - 15.0 g/dL   HCT 33.2 (L) 36.0 - 46.0 %   MCV 69.9 (L) 78.0 - 100.0 fL   MCH 22.7 (L) 26.0 - 34.0 pg   MCHC 32.5 30.0 - 36.0 g/dL   RDW 13.8 11.5 -  15.5 %   Platelets 381 150 - 400 K/uL   Neutrophils Relative % 85 %   Neutro Abs 12.5 (H) 1.7 - 7.7 K/uL   Lymphocytes Relative 11 %   Lymphs Abs 1.6 0.7 - 4.0 K/uL   Monocytes Relative 3 %   Monocytes Absolute 0.4 0.1 - 1.0 K/uL   Eosinophils Relative 1 %   Eosinophils Absolute 0.2 0.0 - 0.7 K/uL   Basophils Relative 0 %   Basophils Absolute 0.0 0.0 - 0.1 K/uL  Comprehensive metabolic panel     Status: Abnormal   Collection Time: 10/18/16  6:10 AM  Result Value Ref Range   Sodium 134 (L) 135 - 145 mmol/L   Potassium 4.5 3.5 - 5.1 mmol/L   Chloride 103 101 - 111 mmol/L   CO2 21 (L) 22 - 32 mmol/L   Glucose, Bld 148 (H) 65 - 99 mg/dL   BUN 7 6 - 20 mg/dL   Creatinine, Ser 0.47 0.44 - 1.00 mg/dL   Calcium 9.0 8.9 - 10.3 mg/dL   Total Protein 6.9 6.5 - 8.1 g/dL   Albumin 3.2 (L) 3.5 - 5.0 g/dL   AST 112 (H) 15 -  41 U/L   ALT 70 (H) 14 - 54 U/L   Alkaline Phosphatase 78 38 - 126 U/L   Total Bilirubin 1.7 (H) 0.3 - 1.2 mg/dL   GFR calc non Af Amer >60 >60 mL/min   GFR calc Af Amer >60 >60 mL/min    Comment: (NOTE) The eGFR has been calculated using the CKD EPI equation. This calculation has not been validated in all clinical situations. eGFR's persistently <60 mL/min signify possible Chronic Kidney Disease.    Anion gap 10 5 - 15  Lipase, blood     Status: None   Collection Time: 10/18/16  6:10 AM  Result Value Ref Range   Lipase 22 11 - 51 U/L  Type and screen Tarnov     Status: None   Collection Time: 10/18/16  8:09 AM  Result Value Ref Range   ABO/RH(D) B POS    Antibody Screen NEG    Sample Expiration 10/21/2016   Glucose, capillary     Status: Abnormal   Collection Time: 10/18/16  9:27 AM  Result Value Ref Range   Glucose-Capillary 116 (H) 65 - 99 mg/dL  Glucose, capillary     Status: Abnormal   Collection Time: 10/18/16 11:58 AM  Result Value Ref Range   Glucose-Capillary 105 (H) 65 - 99 mg/dL  Glucose, capillary     Status: None    Collection Time: 10/18/16  5:05 PM  Result Value Ref Range   Glucose-Capillary 98 65 - 99 mg/dL  Glucose, capillary     Status: None   Collection Time: 10/18/16 10:07 PM  Result Value Ref Range   Glucose-Capillary 92 65 - 99 mg/dL  Glucose, capillary     Status: Abnormal   Collection Time: 10/19/16 12:37 AM  Result Value Ref Range   Glucose-Capillary 102 (H) 65 - 99 mg/dL  Glucose, capillary     Status: Abnormal   Collection Time: 10/19/16  5:02 AM  Result Value Ref Range   Glucose-Capillary 102 (H) 65 - 99 mg/dL  CBC     Status: Abnormal   Collection Time: 10/19/16  5:25 AM  Result Value Ref Range   WBC 9.7 4.0 - 10.5 K/uL   RBC 4.43 3.87 - 5.11 MIL/uL   Hemoglobin 9.9 (L) 12.0 - 15.0 g/dL   HCT 31.2 (L) 36.0 - 46.0 %   MCV 70.4 (L) 78.0 - 100.0 fL   MCH 22.3 (L) 26.0 - 34.0 pg   MCHC 31.7 30.0 - 36.0 g/dL   RDW 13.7 11.5 - 15.5 %   Platelets 345 150 - 400 K/uL  Comprehensive metabolic panel     Status: Abnormal   Collection Time: 10/19/16  5:25 AM  Result Value Ref Range   Sodium 135 135 - 145 mmol/L   Potassium 4.2 3.5 - 5.1 mmol/L   Chloride 103 101 - 111 mmol/L   CO2 23 22 - 32 mmol/L   Glucose, Bld 107 (H) 65 - 99 mg/dL   BUN 6 6 - 20 mg/dL   Creatinine, Ser 0.49 0.44 - 1.00 mg/dL   Calcium 9.0 8.9 - 10.3 mg/dL   Total Protein 6.2 (L) 6.5 - 8.1 g/dL   Albumin 2.9 (L) 3.5 - 5.0 g/dL   AST 61 (H) 15 - 41 U/L   ALT 90 (H) 14 - 54 U/L   Alkaline Phosphatase 83 38 - 126 U/L   Total Bilirubin 2.4 (H) 0.3 - 1.2 mg/dL   GFR calc non Af Amer >60 >  60 mL/min   GFR calc Af Amer >60 >60 mL/min    Comment: (NOTE) The eGFR has been calculated using the CKD EPI equation. This calculation has not been validated in all clinical situations. eGFR's persistently <60 mL/min signify possible Chronic Kidney Disease.    Anion gap 9 5 - 15    Radiology/Results: US Abdomen Limited  Result Date: 10/18/2016 CLINICAL DATA:  Abdominal pain. EXAM: US ABDOMEN LIMITED - RIGHT UPPER  QUADRANT COMPARISON:  Ultrasound 09/24/2016. FINDINGS: Gallbladder: Multiple gallstones.  Sludge.  Gallbladder wall thickness 1.7 mm. Common bile duct: Diameter: 4.7 mm Liver: Heterogeneous parenchymal pattern consistent with fatty infiltration and/or hepatocellular disease. IMPRESSION: 1. Gallstones and sludge. No gallbladder wall thickening. No biliary distention. 2. Heterogeneous hepatic parenchymal pattern consistent with fatty infiltration oral and or hepatocellular disease. Electronically Signed   By: Marcello Moores  Register   On: 10/18/2016 07:18    Anti-infectives: Anti-infectives    Start     Dose/Rate Route Frequency Ordered Stop   10/18/16 1300  cefTRIAXone (ROCEPHIN) 2 g in dextrose 5 % 50 mL IVPB     2 g 100 mL/hr over 30 Minutes Intravenous Every 24 hours 10/18/16 1240        Assessment/Plan: Problem List: Patient Active Problem List   Diagnosis Date Noted  . Gallstones 10/18/2016  . Pregnancy related hip pain, antepartum 10/16/2016  . Supervision of normal pregnancy, antepartum 07/10/2016  . H/O preterm delivery, currently pregnant 07/10/2016  . Gestational diabetes 03/10/2014  . ANEMIA, OTHER, UNSPECIFIED 11/13/2006    Cholelithaisis;  T bili is 2.4 today.  Pain may be better but with pain meds.  On Rocephin.  Continue observation.   * No surgery found *    LOS: 1 day   Matt B. Hassell Done, MD, [facility] [phone] beeper [phone]  10/19/2016 11:01 AM

## 2016-10-19 NOTE — Progress Notes (Signed)
Patient ID: Connie White, female   DOB: 09/09/1996, 21 y.o.   MRN: [id number] FACULTY PRACTICE ANTEPARTUM(COMPREHENSIVE) NOTE  Connie White is a 21 y.o. [id number] with Estimated Date of Delivery: 01/14/17   By  early ultrasound 1848w4d  who is admitted for cholelithiasis/cholecystitis with elevated LFTs.    Fetal presentation is unsure. Length of Stay:  1  Days  Date of admission:10/18/2016  Subjective: Pt feels a bit better this am, still with epigastric pain Patient reports the fetal movement as active. Patient reports uterine contraction  activity as none. Patient reports  vaginal bleeding as none. Patient describes fluid per vagina as None.  Vitals:  Blood pressure (!) 107/52, pulse 84, temperature 98.4 F (36.9 C), temperature source Oral, resp. rate 18, height 4' 7.5" (1.41 m), weight 158 lb (71.7 kg), last menstrual period 03/30/2016, SpO2 99 %. Vitals:   10/18/16 0813 10/18/16 1931 10/18/16 2300 10/19/16 0833  BP: 119/64 106/60  (!) 107/52  Pulse: 99 92 90 84  Resp: 20 16  18   Temp: 97.6 F (36.4 C) 98.5 F (36.9 C)  98.4 F (36.9 C)  TempSrc: Oral Oral  Oral  SpO2: 96% 100% 95% 99%  Weight:      Height:       Physical Examination:  General appearance - alert, well appearing, and in no distress Abdomen soft tender in the RUQ no rebound Fundal Height:  size equals dates   Fetal Monitoring:     Appropriate for gestational age  Labs:  Results for orders placed or performed during the hospital encounter of 10/18/16 (from the past 24 hour(s))  Glucose, capillary   Collection Time: 10/18/16  5:05 PM  Result Value Ref Range   Glucose-Capillary 98 65 - 99 mg/dL  Glucose, capillary   Collection Time: 10/18/16 10:07 PM  Result Value Ref Range   Glucose-Capillary 92 65 - 99 mg/dL  Glucose, capillary   Collection Time: 10/19/16 12:37 AM  Result Value Ref Range   Glucose-Capillary 102 (H) 65 - 99 mg/dL  Glucose, capillary   Collection Time: 10/19/16  5:02 AM  Result Value  Ref Range   Glucose-Capillary 102 (H) 65 - 99 mg/dL  CBC   Collection Time: 10/19/16  5:25 AM  Result Value Ref Range   WBC 9.7 4.0 - 10.5 K/uL   RBC 4.43 3.87 - 5.11 MIL/uL   Hemoglobin 9.9 (L) 12.0 - 15.0 g/dL   HCT 14.731.2 (L) 82.936.0 - 56.246.0 %   MCV 70.4 (L) 78.0 - 100.0 fL   MCH 22.3 (L) 26.0 - 34.0 pg   MCHC 31.7 30.0 - 36.0 g/dL   RDW 13.013.7 86.511.5 - 78.415.5 %   Platelets 345 150 - 400 K/uL  Comprehensive metabolic panel   Collection Time: 10/19/16  5:25 AM  Result Value Ref Range   Sodium 135 135 - 145 mmol/L   Potassium 4.2 3.5 - 5.1 mmol/L   Chloride 103 101 - 111 mmol/L   CO2 23 22 - 32 mmol/L   Glucose, Bld 107 (H) 65 - 99 mg/dL   BUN 6 6 - 20 mg/dL   Creatinine, Ser 6.960.49 0.44 - 1.00 mg/dL   Calcium 9.0 8.9 - 29.510.3 mg/dL   Total Protein 6.2 (L) 6.5 - 8.1 g/dL   Albumin 2.9 (L) 3.5 - 5.0 g/dL   AST 61 (H) 15 - 41 U/L   ALT 90 (H) 14 - 54 U/L   Alkaline Phosphatase 83 38 - 126 U/L   Total Bilirubin  2.4 (H) 0.3 - 1.2 mg/dL   GFR calc non Af Amer >60 >60 mL/min   GFR calc Af Amer >60 >60 mL/min   Anion gap 9 5 - 15  Glucose, capillary   Collection Time: 10/19/16 12:16 PM  Result Value Ref Range   Glucose-Capillary 102 (H) 65 - 99 mg/dL    Imaging Studies:      Medications:  Scheduled . cefTRIAXone (ROCEPHIN)  IV  2 g Intravenous Q24H  . insulin aspart  0-15 Units Subcutaneous Q4H   I have reviewed the patient's current medications.  ASSESSMENT: [id number] [redacted]w[redacted]d Estimated Date of Delivery: 01/14/17  Patient Active Problem List   Diagnosis Date Noted  . Gallstones 10/18/2016  . Pregnancy related hip pain, antepartum 10/16/2016  . Supervision of normal pregnancy, antepartum 07/10/2016  . H/O preterm delivery, currently pregnant 07/10/2016  . Gestational diabetes 03/10/2014  . ANEMIA, OTHER, UNSPECIFIED 11/13/2006    PLAN: Pt is currently NPO on rocephin per General surgery who will assess pt today Her LFTs are improving and clinically she is improved  Lazaro Arms,  MD

## 2016-10-19 NOTE — Progress Notes (Signed)
Phone call of inquiry, second call.

## 2016-10-20 DIAGNOSIS — K8020 Calculus of gallbladder without cholecystitis without obstruction: Secondary | ICD-10-CM

## 2016-10-20 LAB — CBC WITH DIFFERENTIAL/PLATELET
BASOS PCT: 0 %
Basophils Absolute: 0 10*3/uL (ref 0.0–0.1)
EOS ABS: 0.3 10*3/uL (ref 0.0–0.7)
EOS PCT: 3 %
HEMATOCRIT: 30.4 % — AB (ref 36.0–46.0)
Hemoglobin: 9.7 g/dL — ABNORMAL LOW (ref 12.0–15.0)
Lymphocytes Relative: 14 %
Lymphs Abs: 1.4 10*3/uL (ref 0.7–4.0)
MCH: 22.5 pg — ABNORMAL LOW (ref 26.0–34.0)
MCHC: 31.9 g/dL (ref 30.0–36.0)
MCV: 70.5 fL — ABNORMAL LOW (ref 78.0–100.0)
MONO ABS: 0.4 10*3/uL (ref 0.1–1.0)
MONOS PCT: 4 %
NEUTROS ABS: 8 10*3/uL — AB (ref 1.7–7.7)
Neutrophils Relative %: 79 %
PLATELETS: 328 10*3/uL (ref 150–400)
RBC: 4.31 MIL/uL (ref 3.87–5.11)
RDW: 13.5 % (ref 11.5–15.5)
WBC: 10.2 10*3/uL (ref 4.0–10.5)

## 2016-10-20 LAB — COMPREHENSIVE METABOLIC PANEL WITH GFR
ALT: 88 U/L — ABNORMAL HIGH (ref 14–54)
AST: 51 U/L — ABNORMAL HIGH (ref 15–41)
Albumin: 2.9 g/dL — ABNORMAL LOW (ref 3.5–5.0)
Alkaline Phosphatase: 76 U/L (ref 38–126)
Anion gap: 10 (ref 5–15)
BUN: 8 mg/dL (ref 6–20)
CO2: 23 mmol/L (ref 22–32)
Calcium: 8.9 mg/dL (ref 8.9–10.3)
Chloride: 102 mmol/L (ref 101–111)
Creatinine, Ser: 0.48 mg/dL (ref 0.44–1.00)
GFR calc Af Amer: >60 mL/min
GFR calc non Af Amer: >60 mL/min
Glucose, Bld: 108 mg/dL — ABNORMAL HIGH (ref 65–99)
Potassium: 3.7 mmol/L (ref 3.5–5.1)
Sodium: 135 mmol/L (ref 135–145)
Total Bilirubin: 2 mg/dL — ABNORMAL HIGH (ref 0.3–1.2)
Total Protein: 6.5 g/dL (ref 6.5–8.1)

## 2016-10-20 LAB — GLUCOSE, CAPILLARY
Glucose-Capillary: 103 mg/dL — ABNORMAL HIGH (ref 65–99)
Glucose-Capillary: 97 mg/dL (ref 65–99)
Glucose-Capillary: 133 mg/dL — ABNORMAL HIGH (ref 65–99)
Glucose-Capillary: 139 mg/dL — ABNORMAL HIGH (ref 65–99)

## 2016-10-20 MED ORDER — OXYCODONE HCL 5 MG PO TABS
5.0000 mg | ORAL_TABLET | Freq: Four times a day (QID) | ORAL | Status: DC | PRN
  Administered 2016-10-20 (×2): 5 mg via ORAL
  Administered 2016-10-21: 10 mg via ORAL
  Filled 2016-10-20 (×2): qty 2

## 2016-10-20 NOTE — Progress Notes (Signed)
Patient deffering EFM.

## 2016-10-20 NOTE — Progress Notes (Signed)
Dr. Ezzard StandingNewman with surgery is at the bedside.

## 2016-10-20 NOTE — Progress Notes (Signed)
Patient states that oxycodone tablets did not "touch her pain." The patient states that the dilaudid is helpful, but for only a short period of time. Will continue to monitor and report this to MD and RN during morning rounds for further pain management for discharge. Milon DikesKristina D Eesa Justiss, RN

## 2016-10-20 NOTE — Progress Notes (Signed)
FACULTY PRACTICE ANTEPARTUM(COMPREHENSIVE) NOTE  Connie White is a 21 y.o. [id number] at [redacted]w[redacted]d  who is admitted for gallstones and cholecystitis.    Length of Stay:  2  Days  Subjective: Still with RUQ pain, some nausea Patient reports the fetal movement as active. Patient reports uterine contraction  activity as none. Patient reports  vaginal bleeding as none. Patient describes fluid per vagina as None.  Vitals:  Blood pressure 122/63, pulse 97, temperature 98.5 F (36.9 C), temperature source Oral, resp. rate 16, height 4' 7.5" (1.41 m), weight 71.7 kg (158 lb), last menstrual period 03/30/2016, SpO2 96 %. Physical Examination:  General appearance - alert, well appearing, and in no distress Heart - normal rate and regular rhythm Abdomen - soft, nontender, nondistended Fundal Height:  size equals dates, RUQ tender Cervical Exam: Not evaluated.  Extremities: extremities normal, atraumatic, no cyanosis or edema Membranes:intact Fetal Heart Rate A  Mode External filed at 10/19/2016 2320  Baseline Rate (A) 140 bpm filed at 10/19/2016 2320  Variability 6-25 BPM filed at 10/19/2016 2320  Accelerations 15 x 15 filed at 10/19/2016 2320  Decelerations Variable filed at 10/19/2016 2320      Labs:  Results for orders placed or performed during the hospital encounter of 10/18/16 (from the past 24 hour(s))  Glucose, capillary   Collection Time: 10/19/16 12:16 PM  Result Value Ref Range   Glucose-Capillary 102 (H) 65 - 99 mg/dL  Glucose, capillary   Collection Time: 10/19/16  6:02 PM  Result Value Ref Range   Glucose-Capillary 87 65 - 99 mg/dL  Glucose, capillary   Collection Time: 10/19/16  8:48 PM  Result Value Ref Range   Glucose-Capillary 99 65 - 99 mg/dL  CBC with Differential/Platelet   Collection Time: 10/20/16  5:23 AM  Result Value Ref Range   WBC 10.2 4.0 - 10.5 K/uL   RBC 4.31 3.87 - 5.11 MIL/uL   Hemoglobin 9.7 (L) 12.0 - 15.0 g/dL   HCT 45.4 (L) 09.8 - 11.9 %   MCV  70.5 (L) 78.0 - 100.0 fL   MCH 22.5 (L) 26.0 - 34.0 pg   MCHC 31.9 30.0 - 36.0 g/dL   RDW 14.7 82.9 - 56.2 %   Platelets 328 150 - 400 K/uL   Neutrophils Relative % 79 %   Neutro Abs 8.0 (H) 1.7 - 7.7 K/uL   Lymphocytes Relative 14 %   Lymphs Abs 1.4 0.7 - 4.0 K/uL   Monocytes Relative 4 %   Monocytes Absolute 0.4 0.1 - 1.0 K/uL   Eosinophils Relative 3 %   Eosinophils Absolute 0.3 0.0 - 0.7 K/uL   Basophils Relative 0 %   Basophils Absolute 0.0 0.0 - 0.1 K/uL  Comprehensive metabolic panel   Collection Time: 10/20/16  5:23 AM  Result Value Ref Range   Sodium 135 135 - 145 mmol/L   Potassium 3.7 3.5 - 5.1 mmol/L   Chloride 102 101 - 111 mmol/L   CO2 23 22 - 32 mmol/L   Glucose, Bld 108 (H) 65 - 99 mg/dL   BUN 8 6 - 20 mg/dL   Creatinine, Ser 1.30 0.44 - 1.00 mg/dL   Calcium 8.9 8.9 - 86.5 mg/dL   Total Protein 6.5 6.5 - 8.1 g/dL   Albumin 2.9 (L) 3.5 - 5.0 g/dL   AST 51 (H) 15 - 41 U/L   ALT 88 (H) 14 - 54 U/L   Alkaline Phosphatase 76 38 - 126 U/L   Total Bilirubin 2.0 (H)  0.3 - 1.2 mg/dL   GFR calc non Af Amer >60 >60 mL/min   GFR calc Af Amer >60 >60 mL/min   Anion gap 10 5 - 15  Glucose, capillary   Collection Time: 10/20/16  5:47 AM  Result Value Ref Range   Glucose-Capillary 103 (H) 65 - 99 mg/dL    Imaging Studies:     Medications:  Scheduled . cefTRIAXone (ROCEPHIN)  IV  2 g Intravenous Q24H  . pantoprazole  40 mg Oral Daily   I have reviewed the patient's current medications.  ASSESSMENT: Patient Active Problem List   Diagnosis Date Noted  . Gallstones 10/18/2016  . Pregnancy related hip pain, antepartum 10/16/2016  . Supervision of normal pregnancy, antepartum 07/10/2016  . H/O preterm delivery, currently pregnant 07/10/2016  . Gestational diabetes 03/10/2014  . ANEMIA, OTHER, UNSPECIFIED 11/13/2006    PLAN: Continue antibiotics and surgery to follow  Scheryl DarterJames Angeliyah Kirkey 10/20/2016,6:08 AM

## 2016-10-20 NOTE — Progress Notes (Signed)
Patient has deferred (requested) EFM to 1000.

## 2016-10-20 NOTE — Progress Notes (Addendum)
[facility] Office:  [phone] General Surgery Progress Note   LOS: 2 days  POD -     Assessment/Plan: 1. Gall bladder disease  WBC - 10,200 - 10/20/2016  LFT's about the same  US - 2/2 - showed gallstone and sludge - no stigmata of acute cholecystitis  On rocephin - 2/2 >>>  She has no stigmata of cholecystitis - her described pain is out of proportion to what I find on exam.  Will continue to follow - would treat with antibiotics for one week.  The big question is whether her abdominal pain can be managed by oral pain meds.  We talked about possible surgery, the risks to the baby, and the possibility of open surgery.  At this time, there are no reasons for urgent surgery.  2.  Pregnant  About 27 weeks 3.  Quit smoking when she found out she was pregnant   Active Problems:   Gallstones  Subjective:  Epigastric to RUQ abdominal pain which hurts worse when she gets up.  This all came on this past Thursday.  Her family is Counselling psychologistMontagnard. Objective:   Vitals:   10/20/16 0547 10/20/16 0929  BP: 100/61 (!) 90/52  Pulse: 99 92  Resp: 18 16  Temp: 98.4 F (36.9 C) 98.3 F (36.8 C)     Intake/Output from previous day:  02/03 0701 - 02/04 0700 In: 1300 [I.V.:1250; IV Piggyback:50] Out: -   Intake/Output this shift:  Total I/O In: 250 [I.V.:250] Out: -    Physical Exam:   General: WF F who is alert and oriented.    HEENT: Normal. Pupils equal. .   Lungs: Clear   Abdomen: Pregnant.  She has no localized abdominal pain or guarding.   Lab Results:    Recent Labs  10/19/16 0525 10/20/16 0523  WBC 9.7 10.2  HGB 9.9* 9.7*  HCT 31.2* 30.4*  PLT 345 328    BMET   Recent Labs  10/19/16 0525 10/20/16 0523  NA 135 135  K 4.2 3.7  CL 103 102  CO2 23 23  GLUCOSE 107* 108*  BUN 6 8  CREATININE 0.49 0.48  CALCIUM 9.0 8.9    PT/INR  No results for input(s): LABPROT, INR in the last 72 hours.  ABG  No results for input(s): PHART, HCO3 in the last 72  hours.  Invalid input(s): PCO2, PO2   Studies/Results:  No results found.   Anti-infectives:   Anti-infectives    Start     Dose/Rate Route Frequency Ordered Stop   10/18/16 1300  cefTRIAXone (ROCEPHIN) 2 g in dextrose 5 % 50 mL IVPB     2 g 100 mL/hr over 30 Minutes Intravenous Every 24 hours 10/18/16 1240        Ovidio Kinavid Borden Thune, MD, FACS Pager: [phone] [facility]: [phone] 10/20/2016

## 2016-10-20 NOTE — Progress Notes (Signed)
Fetal heart tones audible.  Baby is constantly moving, which is "the norm".  Second RN, to attempt to keep baby on strip.  Strip from 1113-1228, the strip looks good, just some intermittant tracing due to fetal movement.

## 2016-10-21 ENCOUNTER — Inpatient Hospital Stay (HOSPITAL_COMMUNITY): Payer: Medicaid Other

## 2016-10-21 DIAGNOSIS — O99613 Diseases of the digestive system complicating pregnancy, third trimester: Secondary | ICD-10-CM

## 2016-10-21 LAB — CBC WITH DIFFERENTIAL/PLATELET
BASOS ABS: 0 10*3/uL (ref 0.0–0.1)
Basophils Relative: 0 %
EOS PCT: 4 %
Eosinophils Absolute: 0.4 10*3/uL (ref 0.0–0.7)
HCT: 29 % — ABNORMAL LOW (ref 36.0–46.0)
HEMOGLOBIN: 9.5 g/dL — AB (ref 12.0–15.0)
LYMPHS ABS: 1.5 10*3/uL (ref 0.7–4.0)
Lymphocytes Relative: 14 %
MCH: 23.3 pg — ABNORMAL LOW (ref 26.0–34.0)
MCHC: 32.8 g/dL (ref 30.0–36.0)
MCV: 71.1 fL — ABNORMAL LOW (ref 78.0–100.0)
MONO ABS: 0.7 10*3/uL (ref 0.1–1.0)
Monocytes Relative: 6 %
NEUTROS ABS: 8.5 10*3/uL — AB (ref 1.7–7.7)
Neutrophils Relative %: 77 %
Platelets: 304 10*3/uL (ref 150–400)
RBC: 4.08 MIL/uL (ref 3.87–5.11)
RDW: 13.6 % (ref 11.5–15.5)
WBC: 11.1 10*3/uL — AB (ref 4.0–10.5)

## 2016-10-21 LAB — GLUCOSE, CAPILLARY
GLUCOSE-CAPILLARY: 114 mg/dL — AB (ref 65–99)
GLUCOSE-CAPILLARY: 110 mg/dL — AB (ref 65–99)
GLUCOSE-CAPILLARY: 92 mg/dL (ref 65–99)

## 2016-10-21 MED ORDER — CHLORHEXIDINE GLUCONATE CLOTH 2 % EX PADS
6.0000 | MEDICATED_PAD | Freq: Once | CUTANEOUS | Status: AC
  Administered 2016-10-22: 6 via TOPICAL

## 2016-10-21 MED ORDER — SODIUM CHLORIDE 0.9 % IV SOLN
INTRAVENOUS | Status: DC
Start: 2016-10-21 — End: 2016-10-22
  Administered 2016-10-21 – 2016-10-22 (×2): via INTRAVENOUS

## 2016-10-21 MED ORDER — CHLORHEXIDINE GLUCONATE CLOTH 2 % EX PADS: 6.0000 | MEDICATED_PAD | Freq: Once | CUTANEOUS | Status: DC

## 2016-10-21 NOTE — Progress Notes (Signed)
Patient ID: Connie White, female   DOB: 10/19/1995, 21 y.o.   MRN: [id number] ACULTY PRACTICE ANTEPARTUM COMPREHENSIVE PROGRESS NOTE  Connie White is a 21 y.o. [id number] at 5330w6d  who is admitted for RUQ pain; and elevated transaminases.   Fetal presentation is unsure. Length of Stay:  3  Days  Subjective: Pt reports continued decreased appetite. She denies N/V.  She reports that her pain is not controlled with po pain meds Patient reports good fetal movement.  She reports no uterine contractions, no bleeding and no loss of fluid per vagina.  Vitals:  Blood pressure (!) 104/55, pulse (!) 101, temperature 98.9 F (37.2 C), temperature source Oral, resp. rate 16, height 4' 7.5" (1.41 m), weight 158 lb (71.7 kg), last menstrual period 03/30/2016, SpO2 94 %. Physical Examination: General appearance - in mild to moderate distress Chest - clear to auscultation, no wheezes, rales or rhonchi, symmetric air entry Heart - normal rate, regular rhythm, normal S1, S2, no murmurs, rubs, clicks or gallops Abdomen - soft, nontender, nondistended, no masses or organomegaly gravid Extremities - peripheral pulses normal, no pedal edema, no clubbing or cyanosis Cervical Exam: Not evaluated. Membranes:intact  Fetal Monitoring:  Baseline: 150's bpm, Variability: Good {> 6 bpm), Accelerations: Reactive, Decelerations: Absent and toco: occ ctx some irritablilty  Labs:  Results for orders placed or performed during the hospital encounter of 10/18/16 (from the past 24 hour(s))  Glucose, capillary   Collection Time: 10/20/16 12:00 PM  Result Value Ref Range   Glucose-Capillary 97 65 - 99 mg/dL  Glucose, capillary   Collection Time: 10/20/16  6:36 PM  Result Value Ref Range   Glucose-Capillary 133 (H) 65 - 99 mg/dL  Glucose, capillary   Collection Time: 10/20/16 11:04 PM  Result Value Ref Range   Glucose-Capillary 139 (H) 65 - 99 mg/dL  CBC with Differential/Platelet   Collection Time: 10/21/16  5:31 AM   Result Value Ref Range   WBC 11.1 (H) 4.0 - 10.5 K/uL   RBC 4.08 3.87 - 5.11 MIL/uL   Hemoglobin 9.5 (L) 12.0 - 15.0 g/dL   HCT 36.629.0 (L) 44.036.0 - 34.746.0 %   MCV 71.1 (L) 78.0 - 100.0 fL   MCH 23.3 (L) 26.0 - 34.0 pg   MCHC 32.8 30.0 - 36.0 g/dL   RDW 42.513.6 95.611.5 - 38.715.5 %   Platelets 304 150 - 400 K/uL   Neutrophils Relative % 77 %   Neutro Abs 8.5 (H) 1.7 - 7.7 K/uL   Lymphocytes Relative 14 %   Lymphs Abs 1.5 0.7 - 4.0 K/uL   Monocytes Relative 6 %   Monocytes Absolute 0.7 0.1 - 1.0 K/uL   Eosinophils Relative 4 %   Eosinophils Absolute 0.4 0.0 - 0.7 K/uL   Basophils Relative 0 %   Basophils Absolute 0.0 0.0 - 0.1 K/uL  Glucose, capillary   Collection Time: 10/21/16  5:40 AM  Result Value Ref Range   Glucose-Capillary 114 (H) 65 - 99 mg/dL    Imaging Studies:    None pending  Medications:  Scheduled . cefTRIAXone (ROCEPHIN)  IV  2 g Intravenous Q24H  . pantoprazole  40 mg Oral Daily   I have reviewed the patient's current medications.  ASSESSMENT: Patient Active Problem List   Diagnosis Date Noted  . Gallstones 10/18/2016  . Pregnancy related hip pain, antepartum 10/16/2016  . Supervision of normal pregnancy, antepartum 07/10/2016  . H/O preterm delivery, currently pregnant 07/10/2016  . Gestational diabetes 03/10/2014  .  ANEMIA, OTHER, UNSPECIFIED 11/13/2006    PLAN: I have discussed this pts care with the surgery team.  They plan to take pt to the OR tomorrow for a cholecystomy. Given her sx I concur with the tx plan.  I have recommended to Dr. Derrell Lolling a MRCP over an ERCP if imaging is needed to decrease the risk of radiation and eliminate the need for contrast administration I have spoken to the OB Rapid response team and orders are in for monitoring of the patient tomorrrow  Continue routine antenatal care. Pt should be NPO after MN.  Cont IV atbx and IVF until that time   [facility] 10/21/2016,10:20 AM

## 2016-10-21 NOTE — Progress Notes (Signed)
1600- [facility]

## 2016-10-21 NOTE — Progress Notes (Signed)
Patient ID: Connie White, female   DOB: 04/01/1996, 20 y.o.   MRN: [id number]  [facility] Progress Note     Subjective: No improvement from last week. Patient continues to have significant RUQ pain. She is using dilaudid q2 hours which helps, but when the medicine wears off her pain comes right back. She has been on rocephin x72 hours. Tolerating regular diet.  Objective: Vital signs in last 24 hours: Temp:  [97.5 F (36.4 C)-98.9 F (37.2 C)] 98.9 F (37.2 C) (02/05 0823) Pulse Rate:  [94-101] 101 (02/05 0823) Resp:  [16-20] 16 (02/04 2305) BP: (96-125)/(40-91) 104/55 (02/05 0823) SpO2:  [94 %-96 %] 94 % (02/05 0823) Last BM Date: 10/18/16  Intake/Output from previous day: 02/04 0701 - 02/05 0700 In: 2175 [I.V.:2125; IV Piggyback:50] Out: -  Intake/Output this shift: No intake/output data recorded.  PE: Gen:  Alert, NAD, pleasant Card:  RRR, no M/G/R heard Pulm:  CTAB, no W/R/R, effort normal Abd: gravid, soft, ND, +BS, TTP RUQ/epigastrium, negative murphy sign; no rebound or signs of peritonitis  Ext:  No erythema, edema, or tenderness   Lab Results:   Recent Labs  10/20/16 0523 10/21/16 0531  WBC 10.2 11.1*  HGB 9.7* 9.5*  HCT 30.4* 29.0*  PLT 328 304   BMET  Recent Labs  10/19/16 0525 10/20/16 0523  NA 135 135  K 4.2 3.7  CL 103 102  CO2 23 23  GLUCOSE 107* 108*  BUN 6 8  CREATININE 0.49 0.48  CALCIUM 9.0 8.9   PT/INR No results for input(s): LABPROT, INR in the last 72 hours. CMP     Component Value Date/Time   NA 135 10/20/2016 0523   K 3.7 10/20/2016 0523   CL 102 10/20/2016 0523   CO2 23 10/20/2016 0523   GLUCOSE 108 (H) 10/20/2016 0523   GLUCOSE 105 (H) 02/28/2014 1330   BUN 8 10/20/2016 0523   CREATININE 0.48 10/20/2016 0523   CALCIUM 8.9 10/20/2016 0523   PROT 6.5 10/20/2016 0523   ALBUMIN 2.9 (L) 10/20/2016 0523   AST 51 (H) 10/20/2016 0523   ALT 88 (H) 10/20/2016 0523   ALKPHOS 76 10/20/2016 0523   BILITOT 2.0  (H) 10/20/2016 0523   GFRNONAA >60 10/20/2016 0523   GFRAA >60 10/20/2016 0523   Lipase     Component Value Date/Time   LIPASE 22 10/18/2016 0610       Studies/Results: No results found.  Anti-infectives: Anti-infectives    Start     Dose/Rate Route Frequency Ordered Stop   10/18/16 1300  cefTRIAXone (ROCEPHIN) 2 g in dextrose 5 % 50 mL IVPB     2 g 100 mL/hr over 30 Minutes Intravenous Every 24 hours 10/18/16 1240         Assessment/Plan Cholelithiasis, possible cholecystitis, possible choledocholithiasis - admitted 10/18/16 with acute onset RUQ pain, never had pain like this before - RUQ u/s showed gallstones and sludge, no gallbladder wall thickening, no biliary distention - mild transaminitis and hyperbilirubinemia persistent this admission - WBC 11.1, afebrile - no change in RUQ pain, unable to wean off IV pain medication  Pregnant - [redacted]w[redacted]d  Gestational diabetes - on gliburide at home, SSI here Anemia - Hg 9.5  ID - rocephin VTE - SCDs FEN - regular diet, NPO after midnight  Plan - No change in RUQ abdominal pain over the last 72hr despite rocephin. Unable to control pain with PO medication. Patient would likely benefit from cholecystectomy. Will plan for lap   chole possible open tomorrow at [facility]; spoke with OB/GYN who will consult about whether or not IOC is safe. Ok for diet today, NPO after midnight. [facility].   LOS: 3 days    BROOKE A MILLER , PA-C [facility] 10/21/2016, 12:00 PM Pager: [phone] Consults: [phone] Mon-Fri 7:00 am-4:30 pm Sat-Sun 7:00 am-11:30 am  

## 2016-10-21 NOTE — Progress Notes (Signed)
MD notified of patient contractions, plans reviewed with MD [facility]

## 2016-10-21 NOTE — Progress Notes (Signed)
1730- [facility] called and will come and pick up [facility]. 1745- [facility] RN called for report on patient status.

## 2016-10-22 ENCOUNTER — Encounter (HOSPITAL_COMMUNITY): Payer: Self-pay

## 2016-10-22 ENCOUNTER — Encounter (HOSPITAL_COMMUNITY): Payer: Self-pay | Admitting: Anesthesiology

## 2016-10-22 ENCOUNTER — Ambulatory Visit (HOSPITAL_COMMUNITY)
Admission: RE | Admit: 2016-10-22 | Discharge: 2016-10-22 | Disposition: A | Discharge status: Home / Self Care | Payer: Medicaid Other | Source: Ambulatory Visit | Attending: Obstetrics and Gynecology | Admitting: Obstetrics and Gynecology

## 2016-10-22 ENCOUNTER — Ambulatory Visit: Payer: Self-pay

## 2016-10-22 ENCOUNTER — Inpatient Hospital Stay (HOSPITAL_COMMUNITY): Payer: Medicaid Other | Admitting: Certified Registered"

## 2016-10-22 ENCOUNTER — Encounter (HOSPITAL_COMMUNITY)
Admission: AD | Disposition: A | Discharge status: Home / Self Care | Payer: Self-pay | Source: Ambulatory Visit | Attending: Obstetrics & Gynecology

## 2016-10-22 DIAGNOSIS — K8010 Calculus of gallbladder with chronic cholecystitis without obstruction: Secondary | ICD-10-CM | POA: Diagnosis present

## 2016-10-22 HISTORY — PX: CHOLECYSTECTOMY: SHX55

## 2016-10-22 LAB — CBC
HCT: 28.8 % — ABNORMAL LOW (ref 36.0–46.0)
Hemoglobin: 9.1 g/dL — ABNORMAL LOW (ref 12.0–15.0)
MCH: 22.5 pg — AB (ref 26.0–34.0)
MCHC: 31.6 g/dL (ref 30.0–36.0)
MCV: 71.1 fL — AB (ref 78.0–100.0)
PLATELETS: 328 10*3/uL (ref 150–400)
RBC: 4.05 MIL/uL (ref 3.87–5.11)
RDW: 13.5 % (ref 11.5–15.5)
WBC: 11.6 10*3/uL — AB (ref 4.0–10.5)

## 2016-10-22 LAB — COMPREHENSIVE METABOLIC PANEL
ALBUMIN: 2.7 g/dL — AB (ref 3.5–5.0)
ALK PHOS: 73 U/L (ref 38–126)
ALT: 95 U/L — ABNORMAL HIGH (ref 14–54)
ANION GAP: 11 (ref 5–15)
AST: 55 U/L — ABNORMAL HIGH (ref 15–41)
BILIRUBIN TOTAL: 1.6 mg/dL — AB (ref 0.3–1.2)
BUN: 6 mg/dL (ref 6–20)
CALCIUM: 8.3 mg/dL — AB (ref 8.9–10.3)
CO2: 19 mmol/L — ABNORMAL LOW (ref 22–32)
Chloride: 104 mmol/L (ref 101–111)
Creatinine, Ser: 0.45 mg/dL (ref 0.44–1.00)
Glucose, Bld: 80 mg/dL (ref 65–99)
Potassium: 3.6 mmol/L (ref 3.5–5.1)
Sodium: 134 mmol/L — ABNORMAL LOW (ref 135–145)
TOTAL PROTEIN: 6.6 g/dL (ref 6.5–8.1)

## 2016-10-22 LAB — LIPASE, BLOOD: Lipase: 34 U/L (ref 11–51)

## 2016-10-22 LAB — GLUCOSE, CAPILLARY
GLUCOSE-CAPILLARY: 113 mg/dL — AB (ref 65–99)
Glucose-Capillary: 89 mg/dL (ref 65–99)

## 2016-10-22 LAB — SURGICAL PCR SCREEN
MRSA, PCR: NEGATIVE
STAPHYLOCOCCUS AUREUS: NEGATIVE

## 2016-10-22 SURGERY — LAPAROSCOPIC CHOLECYSTECTOMY WITH INTRAOPERATIVE CHOLANGIOGRAM
Anesthesia: General

## 2016-10-22 MED ORDER — FENTANYL CITRATE (PF) 100 MCG/2ML IJ SOLN
25.0000 ug | INTRAMUSCULAR | Status: DC | PRN
  Administered 2016-10-22 (×3): 50 ug via INTRAVENOUS

## 2016-10-22 MED ORDER — LACTATED RINGERS IV SOLN: INTRAVENOUS | Status: DC

## 2016-10-22 MED ORDER — LACTATED RINGERS IV SOLN
INTRAVENOUS | Status: DC
  Administered 2016-10-22: 1000 mL via INTRAVENOUS

## 2016-10-22 MED ORDER — MEPERIDINE HCL 50 MG/ML IJ SOLN: 6.2500 mg | INTRAMUSCULAR | Status: DC | PRN

## 2016-10-22 MED ORDER — LACTATED RINGERS IV SOLN
INTRAVENOUS | Status: DC
  Administered 2016-10-22 – 2016-10-23 (×3): via INTRAVENOUS

## 2016-10-22 MED ORDER — GLYCOPYRROLATE 0.2 MG/ML IV SOSY
PREFILLED_SYRINGE | INTRAVENOUS | Status: DC | PRN
  Administered 2016-10-22: 0.4 mg via INTRAVENOUS

## 2016-10-22 MED ORDER — LIDOCAINE HCL (CARDIAC) 20 MG/ML IV SOLN
INTRAVENOUS | Status: DC | PRN
  Administered 2016-10-22: 100 mg via INTRATRACHEAL

## 2016-10-22 MED ORDER — HYDROCODONE-ACETAMINOPHEN 5-325 MG PO TABS
1.0000 | ORAL_TABLET | ORAL | Status: DC | PRN
  Administered 2016-10-22 – 2016-10-23 (×6): 2 via ORAL
  Filled 2016-10-22 (×6): qty 2

## 2016-10-22 MED ORDER — ONDANSETRON HCL 4 MG/2ML IJ SOLN
INTRAMUSCULAR | Status: AC
  Filled 2016-10-22: qty 2

## 2016-10-22 MED ORDER — 0.9 % SODIUM CHLORIDE (POUR BTL) OPTIME
TOPICAL | Status: DC | PRN
  Administered 2016-10-22: 1000 mL

## 2016-10-22 MED ORDER — ROCURONIUM BROMIDE 100 MG/10ML IV SOLN
INTRAVENOUS | Status: DC | PRN
  Administered 2016-10-22: 20 mg via INTRAVENOUS

## 2016-10-22 MED ORDER — FENTANYL CITRATE (PF) 100 MCG/2ML IJ SOLN
INTRAMUSCULAR | Status: DC | PRN
  Administered 2016-10-22: 50 ug via INTRAVENOUS
  Administered 2016-10-22 (×2): 25 ug via INTRAVENOUS
  Administered 2016-10-22 (×2): 50 ug via INTRAVENOUS

## 2016-10-22 MED ORDER — SIMETHICONE 80 MG PO CHEW
80.0000 mg | CHEWABLE_TABLET | Freq: Four times a day (QID) | ORAL | Status: DC | PRN
  Administered 2016-10-22: 80 mg via ORAL

## 2016-10-22 MED ORDER — ONDANSETRON 4 MG PO TBDP
4.0000 mg | ORAL_TABLET | Freq: Four times a day (QID) | ORAL | Status: DC | PRN
  Filled 2016-10-22: qty 1

## 2016-10-22 MED ORDER — BUPIVACAINE HCL (PF) 0.5 % IJ SOLN
INTRAMUSCULAR | Status: DC | PRN
  Administered 2016-10-22: 28 mL

## 2016-10-22 MED ORDER — FENTANYL CITRATE (PF) 100 MCG/2ML IJ SOLN
INTRAMUSCULAR | Status: AC
  Filled 2016-10-22: qty 2

## 2016-10-22 MED ORDER — GLYCOPYRROLATE 0.2 MG/ML IV SOSY
PREFILLED_SYRINGE | INTRAVENOUS | Status: AC
  Filled 2016-10-22: qty 5

## 2016-10-22 MED ORDER — ONDANSETRON 4 MG PO TBDP
4.0000 mg | ORAL_TABLET | Freq: Three times a day (TID) | ORAL | Status: DC | PRN
  Filled 2016-10-22: qty 1

## 2016-10-22 MED ORDER — ROCURONIUM BROMIDE 50 MG/5ML IV SOSY
PREFILLED_SYRINGE | INTRAVENOUS | Status: AC
  Filled 2016-10-22: qty 5

## 2016-10-22 MED ORDER — HYDROXYPROGESTERONE CAPROATE 250 MG/ML IM OIL
250.0000 mg | TOPICAL_OIL | INTRAMUSCULAR | Status: DC
  Administered 2016-10-23: 250 mg via INTRAMUSCULAR
  Filled 2016-10-22: qty 1

## 2016-10-22 MED ORDER — SUCCINYLCHOLINE CHLORIDE 20 MG/ML IJ SOLN
INTRAMUSCULAR | Status: DC | PRN
  Administered 2016-10-22: 120 mg via INTRAVENOUS

## 2016-10-22 MED ORDER — COMPLETENATE 29-1 MG PO CHEW
1.0000 | CHEWABLE_TABLET | Freq: Every day | ORAL | Status: DC
  Administered 2016-10-23: 1 via ORAL
  Filled 2016-10-22 (×2): qty 1

## 2016-10-22 MED ORDER — PROPOFOL 10 MG/ML IV BOLUS
INTRAVENOUS | Status: DC | PRN
  Administered 2016-10-22: 160 mg via INTRAVENOUS

## 2016-10-22 MED ORDER — METOCLOPRAMIDE HCL 5 MG/ML IJ SOLN: 10.0000 mg | Freq: Once | INTRAMUSCULAR | Status: DC | PRN

## 2016-10-22 MED ORDER — SUCCINYLCHOLINE CHLORIDE 200 MG/10ML IV SOSY
PREFILLED_SYRINGE | INTRAVENOUS | Status: AC
  Filled 2016-10-22: qty 10

## 2016-10-22 MED ORDER — HYDROXYPROGESTERONE CAPROATE 250 MG/ML IM OIL
250.0000 mg | TOPICAL_OIL | INTRAMUSCULAR | Status: DC
  Filled 2016-10-22 (×2): qty 1

## 2016-10-22 MED ORDER — CYCLOBENZAPRINE HCL 10 MG PO TABS
10.0000 mg | ORAL_TABLET | Freq: Three times a day (TID) | ORAL | Status: DC | PRN
  Filled 2016-10-22: qty 1

## 2016-10-22 MED ORDER — VITAFOL GUMMIES 3.33-0.333-34.8 MG PO CHEW: 3.0000 | CHEWABLE_TABLET | Freq: Every day | ORAL | Status: DC

## 2016-10-22 MED ORDER — ACETAMINOPHEN 500 MG PO TABS: 1000.0000 mg | ORAL_TABLET | Freq: Four times a day (QID) | ORAL | Status: DC | PRN

## 2016-10-22 MED ORDER — PROPOFOL 10 MG/ML IV BOLUS
INTRAVENOUS | Status: AC
  Filled 2016-10-22: qty 40

## 2016-10-22 MED ORDER — SENNA 8.6 MG PO TABS
1.0000 | ORAL_TABLET | Freq: Two times a day (BID) | ORAL | Status: DC
  Administered 2016-10-22 – 2016-10-23 (×2): 8.6 mg via ORAL
  Filled 2016-10-22 (×5): qty 1

## 2016-10-22 MED ORDER — DEXTROSE 5 % IV SOLN: 2.0000 g | Freq: Once | INTRAVENOUS | Status: DC

## 2016-10-22 MED ORDER — GLYBURIDE 5 MG PO TABS
5.0000 mg | ORAL_TABLET | Freq: Every day | ORAL | Status: DC
  Administered 2016-10-22: 5 mg via ORAL
  Filled 2016-10-22 (×2): qty 1

## 2016-10-22 MED ORDER — SODIUM CHLORIDE 0.9 % IV BOLUS (SEPSIS): 1000.0000 mL | Freq: Once | INTRAVENOUS | Status: DC

## 2016-10-22 MED ORDER — ENOXAPARIN SODIUM 40 MG/0.4ML ~~LOC~~ SOLN
40.0000 mg | SUBCUTANEOUS | Status: DC
  Administered 2016-10-23: 40 mg via SUBCUTANEOUS
  Filled 2016-10-22 (×2): qty 0.4

## 2016-10-22 MED ORDER — ONDANSETRON HCL 4 MG/2ML IJ SOLN: 4.0000 mg | Freq: Four times a day (QID) | INTRAMUSCULAR | Status: DC | PRN

## 2016-10-22 MED ORDER — NEOSTIGMINE METHYLSULFATE 5 MG/5ML IV SOSY
PREFILLED_SYRINGE | INTRAVENOUS | Status: AC
  Filled 2016-10-22: qty 5

## 2016-10-22 MED ORDER — NEOSTIGMINE METHYLSULFATE 5 MG/5ML IV SOSY
PREFILLED_SYRINGE | INTRAVENOUS | Status: DC | PRN
  Administered 2016-10-22: 3 mg via INTRAVENOUS

## 2016-10-22 MED ORDER — BUPIVACAINE HCL (PF) 0.5 % IJ SOLN
INTRAMUSCULAR | Status: AC
Start: 2016-10-22 — End: 2016-10-22
  Filled 2016-10-22: qty 30

## 2016-10-22 SURGICAL SUPPLY — 35 items
ADH SKN CLS APL DERMABOND .7 (GAUZE/BANDAGES/DRESSINGS) ×1
APPLIER CLIP ROT 10 11.4 M/L (STAPLE) ×3
APR CLP MED LRG 11.4X10 (STAPLE) ×1
BAG SPEC RTRVL LRG 6X4 10 (ENDOMECHANICALS) ×1
CABLE HIGH FREQUENCY MONO STRZ (ELECTRODE) ×3 IMPLANT
CLIP APPLIE ROT 10 11.4 M/L (STAPLE) ×1 IMPLANT
COVER MAYO STAND STRL (DRAPES) ×3 IMPLANT
COVER SURGICAL LIGHT HANDLE (MISCELLANEOUS) ×3 IMPLANT
DECANTER SPIKE VIAL GLASS SM (MISCELLANEOUS) ×3 IMPLANT
DERMABOND ADVANCED (GAUZE/BANDAGES/DRESSINGS) ×2
DERMABOND ADVANCED .7 DNX12 (GAUZE/BANDAGES/DRESSINGS) ×1 IMPLANT
DEVICE TROCAR PUNCTURE CLOSURE (ENDOMECHANICALS) ×2 IMPLANT
DRAPE C-ARM 42X120 X-RAY (DRAPES) ×3 IMPLANT
ELECT REM PT RETURN 9FT ADLT (ELECTROSURGICAL) ×3
ELECTRODE REM PT RTRN 9FT ADLT (ELECTROSURGICAL) ×1 IMPLANT
GLOVE EUDERMIC 7 POWDERFREE (GLOVE) ×3 IMPLANT
GOWN STRL REUS W/TWL XL LVL3 (GOWN DISPOSABLE) ×12 IMPLANT
HEMOSTAT SNOW SURGICEL 2X4 (HEMOSTASIS) IMPLANT
IRRIG SUCT STRYKERFLOW 2 WTIP (MISCELLANEOUS) ×3
IRRIGATION SUCT STRKRFLW 2 WTP (MISCELLANEOUS) ×1 IMPLANT
KIT BASIN OR (CUSTOM PROCEDURE TRAY) ×3 IMPLANT
POSITIONER SURGICAL ARM (MISCELLANEOUS) IMPLANT
POUCH SPECIMEN RETRIEVAL 10MM (ENDOMECHANICALS) ×3 IMPLANT
SCISSORS LAP 5X35 DISP (ENDOMECHANICALS) ×3 IMPLANT
SET CHOLANGIOGRAPH MIX (MISCELLANEOUS) ×1 IMPLANT
SLEEVE XCEL OPT CAN 5 100 (ENDOMECHANICALS) ×7 IMPLANT
SUT MNCRL AB 4-0 PS2 18 (SUTURE) ×3 IMPLANT
TAPE CLOTH 4X10 WHT NS (GAUZE/BANDAGES/DRESSINGS) IMPLANT
TOWEL OR 17X26 10 PK STRL BLUE (TOWEL DISPOSABLE) ×3 IMPLANT
TOWEL OR NON WOVEN STRL DISP B (DISPOSABLE) ×3 IMPLANT
TRAY LAPAROSCOPIC (CUSTOM PROCEDURE TRAY) ×3 IMPLANT
TROCAR BLADELESS OPT 5 100 (ENDOMECHANICALS) ×3 IMPLANT
TROCAR XCEL BLUNT TIP 100MML (ENDOMECHANICALS) ×1 IMPLANT
TROCAR XCEL NON-BLD 11X100MML (ENDOMECHANICALS) ×3 IMPLANT
TUBING INSUF HEATED (TUBING) ×3 IMPLANT

## 2016-10-22 NOTE — Progress Notes (Signed)
Notified MD of irratibilty, provider reviewed strip. No new order at this time. No further.

## 2016-10-22 NOTE — Progress Notes (Signed)
FMLA paperwork given to night nurse to pass on to day nurse to take to clinic.

## 2016-10-22 NOTE — Transfer of Care (Signed)
Immediate Anesthesia Transfer of Care Note  Patient: Connie White  Procedure(s) Performed: Procedure(s): LAPAROSCOPIC CHOLECYSTECTOMY (N/A)  Patient Location: PACU  Anesthesia Type:General  Level of Consciousness: alert  and patient cooperative  Airway & Oxygen Therapy: Patient Spontanous Breathing and Patient connected to face mask oxygen  Post-op Assessment: Report given to RN and Post -op Vital signs reviewed and stable  Post vital signs: stable  Last Vitals:  Vitals:   10/22/16 0613 10/22/16 0614  BP:  (!) 101/57  Pulse:  100  Resp:  14  Temp: 36.7 C     Last Pain:  Vitals:   10/22/16 0730  TempSrc:   PainSc: 0-No pain      Patients Stated Pain Goal: 3 (10/22/16 [id number])  Complications: No apparent anesthesia complications

## 2016-10-22 NOTE — H&P (View-Only) (Signed)
Patient ID: Connie White, female   DOB: September 23, 1995, 23 y.o.   MRN: [id number]  [facility] Progress Note     Subjective: No improvement from last week. Patient continues to have significant RUQ pain. She is using dilaudid q2 hours which helps, but when the medicine wears off her pain comes right back. She has been on rocephin x72 hours. Tolerating regular diet.  Objective: Vital signs in last 24 hours: Temp:  [97.5 F (36.4 C)-98.9 F (37.2 C)] 98.9 F (37.2 C) (02/05 0823) Pulse Rate:  [94-101] 101 (02/05 0823) Resp:  [16-20] 16 (02/04 2305) BP: (96-125)/(40-91) 104/55 (02/05 0823) SpO2:  [94 %-96 %] 94 % (02/05 0823) Last BM Date: 10/18/16  Intake/Output from previous day: 02/04 0701 - 02/05 0700 In: 2175 [I.V.:2125; IV Piggyback:50] Out: -  Intake/Output this shift: No intake/output data recorded.  PE: Gen:  Alert, NAD, pleasant Card:  RRR, no M/G/R heard Pulm:  CTAB, no W/R/R, effort normal Abd: gravid, soft, ND, +BS, TTP RUQ/epigastrium, negative murphy sign; no rebound or signs of peritonitis  Ext:  No erythema, edema, or tenderness   Lab Results:   Recent Labs  10/20/16 0523 10/21/16 0531  WBC 10.2 11.1*  HGB 9.7* 9.5*  HCT 30.4* 29.0*  PLT 328 304   BMET  Recent Labs  10/19/16 0525 10/20/16 0523  NA 135 135  K 4.2 3.7  CL 103 102  CO2 23 23  GLUCOSE 107* 108*  BUN 6 8  CREATININE 0.49 0.48  CALCIUM 9.0 8.9   PT/INR No results for input(s): LABPROT, INR in the last 72 hours. CMP     Component Value Date/Time   NA 135 10/20/2016 0523   K 3.7 10/20/2016 0523   CL 102 10/20/2016 0523   CO2 23 10/20/2016 0523   GLUCOSE 108 (H) 10/20/2016 0523   GLUCOSE 105 (H) 02/28/2014 1330   BUN 8 10/20/2016 0523   CREATININE 0.48 10/20/2016 0523   CALCIUM 8.9 10/20/2016 0523   PROT 6.5 10/20/2016 0523   ALBUMIN 2.9 (L) 10/20/2016 0523   AST 51 (H) 10/20/2016 0523   ALT 88 (H) 10/20/2016 0523   ALKPHOS 76 10/20/2016 0523   BILITOT 2.0  (H) 10/20/2016 0523   GFRNONAA >60 10/20/2016 0523   GFRAA >60 10/20/2016 0523   Lipase     Component Value Date/Time   LIPASE 22 10/18/2016 0610       Studies/Results: No results found.  Anti-infectives: Anti-infectives    Start     Dose/Rate Route Frequency Ordered Stop   10/18/16 1300  cefTRIAXone (ROCEPHIN) 2 g in dextrose 5 % 50 mL IVPB     2 g 100 mL/hr over 30 Minutes Intravenous Every 24 hours 10/18/16 1240         Assessment/Plan Cholelithiasis, possible cholecystitis, possible choledocholithiasis - admitted 10/18/16 with acute onset RUQ pain, never had pain like this before - RUQ u/s showed gallstones and sludge, no gallbladder wall thickening, no biliary distention - mild transaminitis and hyperbilirubinemia persistent this admission - WBC 11.1, afebrile - no change in RUQ pain, unable to wean off IV pain medication  Pregnant - [redacted]w[redacted]d  Gestational diabetes - on gliburide at home, SSI here Anemia - Hg 9.5  ID - rocephin VTE - SCDs FEN - regular diet, NPO after midnight  Plan - No change in RUQ abdominal pain over the last 72hr despite rocephin. Unable to control pain with PO medication. Patient would likely benefit from cholecystectomy. Will plan for lap  [facility] spoke with OB/GYN who will consult about whether or not IOC is safe. Ok for diet today, NPO after midnight. [facility].   LOS: 3 days    Edson SnowballBROOKE A MILLER , [facility] 10/21/2016, 12:00 PM Pager: [phone] Consults: [phone] Mon-Fri 7:00 am-4:30 pm Sat-Sun 7:00 am-11:30 am

## 2016-10-22 NOTE — Progress Notes (Signed)
[facility]

## 2016-10-22 NOTE — Progress Notes (Signed)
OB rapid response nurse, Montez MoritaErin Hampton, RN, here to check fetal heart rate

## 2016-10-22 NOTE — Anesthesia Preprocedure Evaluation (Signed)
Anesthesia Evaluation  Patient identified by MRN, date of birth, ID band Patient awake    Reviewed: Allergy & Precautions, NPO status , Patient's Chart, lab work & pertinent test results  Airway Mallampati: II  TM Distance: >3 FB Neck ROM: Full    Dental no notable dental hx.    Pulmonary neg pulmonary ROS, former smoker,    Pulmonary exam normal breath sounds clear to auscultation       Cardiovascular negative cardio ROS Normal cardiovascular exam Rhythm:Regular Rate:Normal     Neuro/Psych negative neurological ROS  negative psych ROS   GI/Hepatic negative GI ROS, Neg liver ROS,   Endo/Other  negative endocrine ROSdiabetes, Gestational  Renal/GU negative Renal ROS  negative genitourinary   Musculoskeletal negative musculoskeletal ROS (+)   Abdominal   Peds negative pediatric ROS (+)  Hematology negative hematology ROS (+)   Anesthesia Other Findings [redacted] weeks pregnant  Reproductive/Obstetrics (+) Pregnancy                             Anesthesia Physical Anesthesia Plan  ASA: II  Anesthesia Plan: General   Post-op Pain Management:    Induction: Intravenous, Rapid sequence and Cricoid pressure planned  Airway Management Planned: Oral ETT  Additional Equipment:   Intra-op Plan:   Post-operative Plan: Extubation in OR  Informed Consent: I have reviewed the patients History and Physical, chart, labs and discussed the procedure including the risks, benefits and alternatives for the proposed anesthesia with the patient or authorized representative who has indicated his/her understanding and acceptance.   Dental advisory given  Plan Discussed with: CRNA  Anesthesia Plan Comments: (Will check fetal heart tones pre and post surgery. Risks of surgery/anesthesia during pregnancy discussed extensively.)        Anesthesia Quick Evaluation

## 2016-10-22 NOTE — Anesthesia Postprocedure Evaluation (Signed)
Anesthesia Post Note  Patient: Connie White  Procedure(s) Performed: Procedure(s) (LRB): LAPAROSCOPIC CHOLECYSTECTOMY (N/A)  Patient location during evaluation: PACU Anesthesia Type: General Level of consciousness: awake and alert Pain management: pain level controlled Vital Signs Assessment: post-procedure vital signs reviewed and stable Respiratory status: spontaneous breathing, nonlabored ventilation, respiratory function stable and patient connected to nasal cannula oxygen Cardiovascular status: blood pressure returned to baseline and stable Postop Assessment: no signs of nausea or vomiting Anesthetic complications: no       Last Vitals:  Vitals:   10/22/16 1230 10/22/16 1300  BP: 122/77 122/70  Pulse: 97 88  Resp: (!) 24 (!) 25  Temp:  36.8 C    Last Pain:  Vitals:   10/22/16 1357  TempSrc:   PainSc: 6                  Phillips Groutarignan, [name]

## 2016-10-22 NOTE — Interval H&P Note (Signed)
History and Physical Interval Note:  10/22/2016 10:15 AM  Connie White  has presented today for surgery, with the diagnosis of Symptomatic cholelithiasis  The various methods of treatment have been discussed with the patient and family. After consideration of risks, benefits and other options for treatment, the patient has consented to  Procedure(s): LAPAROSCOPIC CHOLECYSTECTOMY WITH INTRAOPERATIVE CHOLANGIOGRAM POSSIBLE OPEN (N/A) as a surgical intervention .  The patient's history has been reviewed, patient examined, no change in status, stable for surgery.  I have reviewed the patient's chart and labs.  Questions were answered to the patient's satisfaction.     Ernestene MentionINGRAM,Jmari Pelc M

## 2016-10-22 NOTE — Anesthesia Procedure Notes (Signed)
Procedure Name: Intubation Date/Time: 10/22/2016 10:34 AM Performed by: Pilar Grammes Pre-anesthesia Checklist: Patient identified, Emergency Drugs available, Suction available, Patient being monitored and Timeout performed Patient Re-evaluated:Patient Re-evaluated prior to inductionOxygen Delivery Method: Circle system utilized Preoxygenation: Pre-oxygenation with 100% oxygen Intubation Type: IV induction and Rapid sequence LMA Size: 1.5 Laryngoscope Size: 3 and Mac Grade View: Grade I Tube type: Oral Tube size: 7.0 mm Number of attempts: 1 Airway Equipment and Method: Stylet Placement Confirmation: positive ETCO2,  ETT inserted through vocal cords under direct vision,  CO2 detector and breath sounds checked- equal and bilateral Secured at: 20 cm Tube secured with: Tape Dental Injury: Teeth and Oropharynx as per pre-operative assessment

## 2016-10-22 NOTE — Progress Notes (Signed)
Attempted  To place EFM, patient wishes for after 1500 due to surgery pain .  Pain meds administered.

## 2016-10-22 NOTE — Progress Notes (Signed)
Patient ID: Connie White, female   DOB: 09/20/1995, 21 y.o.   MRN: [id number] [facility] ANTEPARTUM COMPREHENSIVE PROGRESS NOTE  Connie Mellody Connie White is a 21 y.o. [id number] at 2865w0d  who is admitted for RUQ pain and cholecystitis.  Pt is scheduled for the OR at [facility] for attempt at lap choley possible open choley today  Fetal presentation is variable. Length of Stay:  4  Days  Subjective: Pt reports continued pain in RUQ. Patient reports good fetal movement.  She reports no uterine contractions, no bleeding and no loss of fluid per vagina.  Vitals:  Blood pressure (!) 101/57, pulse 100, temperature 98 F (36.7 C), temperature source Oral, resp. rate 14, height 4' 7.5" (1.41 m), weight 158 lb (71.7 kg), last menstrual period 03/30/2016, SpO2 96 %. Physical Examination: General appearance - in mild to moderate distress Abdomen - tenderness noted RUQ; no uterine tensderness Cervical Exam: Not evaluated.  Extremities: extremities normal, atraumatic, no cyanosis or edema  Membranes:intact  Fetal Monitoring:  Baseline: 150's bpm, Variability: Good {> 6 bpm), Accelerations: Non-reactive but appropriate for gestational age, Decelerations: Absent and TOCO: nonplapable ctx overnight resolved with hydration  Labs:  Results for orders placed or performed during the hospital encounter of 10/18/16 (from the past 24 hour(s))  Glucose, capillary   Collection Time: 10/21/16 12:12 PM  Result Value Ref Range   Glucose-Capillary 110 (H) 65 - 99 mg/dL   Comment 1 Notify RN    Comment 2 Document in Chart   Glucose, capillary   Collection Time: 10/21/16  5:37 PM  Result Value Ref Range   Glucose-Capillary 92 65 - 99 mg/dL   Comment 1 Notify RN    Comment 2 Document in Chart   Surgical pcr screen   Collection Time: 10/22/16  1:35 AM  Result Value Ref Range   MRSA, PCR NEGATIVE NEGATIVE   Staphylococcus aureus NEGATIVE NEGATIVE  CBC   Collection Time: 10/22/16  5:22 AM  Result Value Ref Range   WBC 11.6  (H) 4.0 - 10.5 K/uL   RBC 4.05 3.87 - 5.11 MIL/uL   Hemoglobin 9.1 (L) 12.0 - 15.0 g/dL   HCT 13.028.8 (L) 86.536.0 - 78.446.0 %   MCV 71.1 (L) 78.0 - 100.0 fL   MCH 22.5 (L) 26.0 - 34.0 pg   MCHC 31.6 30.0 - 36.0 g/dL   RDW 69.613.5 29.511.5 - 28.415.5 %   Platelets 328 150 - 400 K/uL    Imaging Studies:    MRCP  Reviewed; no blockage noted; right hydronephrosis noted (OF NOTE: this is consistent with a normal uterine gestation)   Medications:  Scheduled . cefTRIAXone (ROCEPHIN)  IV  2 g Intravenous Q24H  . pantoprazole  40 mg Oral Daily  . sodium chloride  1,000 mL Intravenous Once   I have reviewed the patient's current medications.  ASSESSMENT: Patient Active Problem List   Diagnosis Date Noted  . Gallstones 10/18/2016  . Pregnancy related hip pain, antepartum 10/16/2016  . Supervision of normal pregnancy, antepartum 07/10/2016  . H/O preterm delivery, currently pregnant 07/10/2016  . Gestational diabetes 03/10/2014  . ANEMIA, OTHER, UNSPECIFIED 11/13/2006    PLAN: TO OR later today [facility] nurse notified will monitor the baby post surgery. [facility]  Will accept transfer back after surgery Continue routine antenatal care.   Connie White 10/22/2016,6:49 AM

## 2016-10-22 NOTE — Op Note (Signed)
Patient Name:           Connie White   Date of Surgery:        10/22/2016  Pre op Diagnosis:      Cholecystitis with cholelithiasis                                       28 week intrauterine pregnancy  Post op Diagnosis:    Same  Procedure:                 Laparoscopic cholecystectomy  Surgeon:                     Angelia MouldHaywood M. Derrell LollingIngram, M.D., FACS  Assistant:                      Feliciana RossettiLuke Kinsinger, M.D.   Indication for Assistant: Complex exposure.  Avoid injury to uterus.  Reduces and is of intraoperative and postoperative complications.  Operative Indications:   This is a 20 [place] female who is [redacted] weeks pregnant.  [facility] of right upper quadrant pain nausea and vomiting.  The pain was constant and unrelenting.  Ultrasound showed gallstones but no inflammation.  There was mild elevation of liver function test. Initial treatment with IV antibiotics, analgesics and bowel rest has been unsuccessful and she continues to have persistent pain.  Ultrasound does not show any acute inflammatory change.  MRCP is negative for common bile duct stones.  Her care was discussed with her obstetrician.  She is brought to the operating room for cholecystectomy because of unrelenting symptoms.  Operative Findings:       The gallbladder was slightly edematous and slightly inflamed, but not bad.  There was no exudate or purulence.  The liver appeared healthy.  The anatomy of the cystic duct and cystic artery were conventional and we created a nice critical view of both structures and could see the common bile duct in the background.  The uterus was gravid but there was no evidence of any injury.  Small bowel and large bowel grossly normal to inspection.  Procedure in Detail:          Following the induction of general endotracheal anesthesia the abdomen was prepped and draped in sterile fashion.  Intravenous antibiotics were given.  Surgical timeout was  performed.  0.5% Marcaine was used as local infiltration anesthetic.  I very carefully inserted a 5 mm optical trocar in the left subcostal region.  Optical entry and pneumoperitoneum were uneventful.  Careful inspection revealed no bleeding or injury to any structure.  A 5 mm trochar was placed in the subxiphoid region.  An 11 mm trocar was placed in the midline of about 12 cm above the umbilicus.  Two 5 mm trochars were placed in the right upper quadrant.  This was all under direct vision.  The gallbladder was retracted and exposed.  The peritoneum over the neck of the gallbladder was incised.  I very carefully dissected out the cystic duct and the cystic artery until I had good length of both structure and could create a nice critical view behind both structures.  Both these structures were controlled with multiple medical clips and divided.  Gallbladder was dissected from its bed with electrocautery placed in a specimen bag and removed.  The operative field was  irrigated.  There was no bleeding or bile leak.     The midline 11 mm port was removed and the fascia was closed with a 0 Vicryl suture using an Endo Close device.  The pneumoperitoneum was released and the rest of the trochars were removed.  The skin incisions were closed with subcuticular 4-0 Monocryl and Dermabond.  Patient tolerated the procedure well was taken to PACU in stable condition.  EBL 10 mL.  Counts correct.  Complications none.     Angelia Mould. Derrell Lolling, M.D., FACS General and Minimally Invasive Surgery Breast and Colorectal Surgery  10/22/2016 11:44 AM

## 2016-10-23 ENCOUNTER — Inpatient Hospital Stay (HOSPITAL_COMMUNITY)
Admit: 2016-10-23 | Discharge: 2016-10-23 | Disposition: A | Discharge status: Home / Self Care | Payer: Medicaid Other | Attending: Maternal and Fetal Medicine | Admitting: Maternal and Fetal Medicine

## 2016-10-23 DIAGNOSIS — O24415 Gestational diabetes mellitus in pregnancy, controlled by oral hypoglycemic drugs: Secondary | ICD-10-CM

## 2016-10-23 DIAGNOSIS — K8001 Calculus of gallbladder with acute cholecystitis with obstruction: Secondary | ICD-10-CM

## 2016-10-23 DIAGNOSIS — Z3A28 28 weeks gestation of pregnancy: Secondary | ICD-10-CM

## 2016-10-23 LAB — CBC
HCT: 28 % — ABNORMAL LOW (ref 36.0–46.0)
HEMOGLOBIN: 8.7 g/dL — AB (ref 12.0–15.0)
MCH: 22.1 pg — AB (ref 26.0–34.0)
MCHC: 31.1 g/dL (ref 30.0–36.0)
MCV: 71.2 fL — ABNORMAL LOW (ref 78.0–100.0)
PLATELETS: 346 10*3/uL (ref 150–400)
RBC: 3.93 MIL/uL (ref 3.87–5.11)
RDW: 13.7 % (ref 11.5–15.5)
WBC: 9.7 10*3/uL (ref 4.0–10.5)

## 2016-10-23 LAB — COMPREHENSIVE METABOLIC PANEL
ALK PHOS: 75 U/L (ref 38–126)
ALT: 109 U/L — AB (ref 14–54)
ANION GAP: 8 (ref 5–15)
AST: 64 U/L — ABNORMAL HIGH (ref 15–41)
Albumin: 2.6 g/dL — ABNORMAL LOW (ref 3.5–5.0)
BILIRUBIN TOTAL: 1.1 mg/dL (ref 0.3–1.2)
BUN: 7 mg/dL (ref 6–20)
CALCIUM: 8.6 mg/dL — AB (ref 8.9–10.3)
CO2: 21 mmol/L — AB (ref 22–32)
CREATININE: 0.51 mg/dL (ref 0.44–1.00)
Chloride: 106 mmol/L (ref 101–111)
GFR calc non Af Amer: >60 mL/min (ref >60)
Glucose, Bld: 60 mg/dL — ABNORMAL LOW (ref 65–99)
Potassium: 3.6 mmol/L (ref 3.5–5.1)
Sodium: 135 mmol/L (ref 135–145)
TOTAL PROTEIN: 6.6 g/dL (ref 6.5–8.1)

## 2016-10-23 LAB — GLUCOSE, CAPILLARY
GLUCOSE-CAPILLARY: 65 mg/dL (ref 65–99)
GLUCOSE-CAPILLARY: 78 mg/dL (ref 65–99)
Glucose-Capillary: 120 mg/dL — ABNORMAL HIGH (ref 65–99)
Glucose-Capillary: 83 mg/dL (ref 65–99)

## 2016-10-23 MED ORDER — ONDANSETRON 4 MG PO TBDP: 4.0000 mg | ORAL_TABLET | Freq: Three times a day (TID) | ORAL | 1 refills | Status: DC | PRN

## 2016-10-23 MED ORDER — DOCUSATE SODIUM 100 MG PO CAPS: 100.0000 mg | ORAL_CAPSULE | Freq: Two times a day (BID) | ORAL | 2 refills | Status: DC | PRN

## 2016-10-23 MED ORDER — HYDROCODONE-ACETAMINOPHEN 5-325 MG PO TABS: 2.0000 | ORAL_TABLET | Freq: Four times a day (QID) | ORAL | 0 refills | Status: DC | PRN

## 2016-10-23 MED ORDER — BISACODYL 10 MG RE SUPP
10.0000 mg | Freq: Once | RECTAL | Status: AC
  Administered 2016-10-23: 10 mg via RECTAL
  Filled 2016-10-23: qty 1

## 2016-10-23 NOTE — Progress Notes (Signed)
Pt discharged home ambulatory in stable condition.  Escorted off unit by this RN.  Discharge instructions and Rx reviewed and given with pt understanding verbalized.

## 2016-10-23 NOTE — Progress Notes (Signed)
Patient ID: Connie White, female   DOB: Jan 23, 1996, 23 y.o.   MRN: [id number]  [facility] Surgery Progress Note  1 Day Post-Op  Subjective: States that she is feeling better today. Continues to have some abdominal soreness, but it is no longer the severe pain that she was having prior to surgery. She is tolerating small amount of soft diet. States that she has not passed flatus nor had a bowel movement. Has ambulated in the room, but plans to ambulate in halls this afternoon. Still requiring IV pain medication, but working on weaning off.  Objective: Vital signs in last 24 hours: Temp:  [98.1 F (36.7 C)-98.8 F (37.1 C)] 98.1 F (36.7 C) (02/07 1220) Pulse Rate:  [78-96] 85 (02/07 1220) Resp:  [16-25] 18 (02/07 1220) BP: (93-122)/(52-70) 107/59 (02/07 1220) SpO2:  [96 %-100 %] 99 % (02/07 1220) Last BM Date: 10/18/16  Intake/Output from previous day: 02/06 0701 - 02/07 0700 In: 3148.3 [P.O.:720; I.V.:2428.3] Out: 1935 [Urine:1925; Blood:10] Intake/Output this shift: Total I/O In: 791.7 [P.O.:240; I.V.:551.7] Out: -   PE: Gen:  Alert, NAD, pleasant Pulm:  effort normal Abd: gravid, soft, ND, +BS, appropriately tender, multiple lap incisions cdi  Lab Results:   Recent Labs  10/22/16 0522 10/23/16 0523  WBC 11.6* 9.7  HGB 9.1* 8.7*  HCT 28.8* 28.0*  PLT 328 346   BMET  Recent Labs  10/22/16 0522 10/23/16 0523  NA 134* 135  K 3.6 3.6  CL 104 106  CO2 19* 21*  GLUCOSE 80 60*  BUN 6 7  CREATININE 0.45 0.51  CALCIUM 8.3* 8.6*   PT/INR No results for input(s): LABPROT, INR in the last 72 hours. CMP     Component Value Date/Time   NA 135 10/23/2016 0523   K 3.6 10/23/2016 0523   CL 106 10/23/2016 0523   CO2 21 (L) 10/23/2016 0523   GLUCOSE 60 (L) 10/23/2016 0523   GLUCOSE 105 (H) 02/28/2014 1330   BUN 7 10/23/2016 0523   CREATININE 0.51 10/23/2016 0523   CALCIUM 8.6 (L) 10/23/2016 0523   PROT 6.6 10/23/2016 0523   ALBUMIN 2.6 (L) 10/23/2016  0523   AST 64 (H) 10/23/2016 0523   ALT 109 (H) 10/23/2016 0523   ALKPHOS 75 10/23/2016 0523   BILITOT 1.1 10/23/2016 0523   GFRNONAA >60 10/23/2016 0523   GFRAA >60 10/23/2016 0523   Lipase     Component Value Date/Time   LIPASE 34 10/22/2016 0522       Studies/Results: Mr Abdomen Mrcp Wo Contrast  Result Date: 10/21/2016 CLINICAL DATA:  21 year old female with intrauterine gestation. Right upper quadrant pain with gallstones and acute right upper quadrant pain EXAM: MRI ABDOMEN WITHOUT CONTRAST  (INCLUDING MRCP) TECHNIQUE: Multiplanar multisequence MR imaging of the abdomen was performed. Heavily T2-weighted images of the biliary and pancreatic ducts were obtained, and three-dimensional MRCP images were rendered by post processing. COMPARISON:  Ultrasound exam from 10/18/2016. FINDINGS: Lower chest: Unremarkable. Hepatobiliary: No focal abnormality within the liver parenchyma. Layering sludge is identified in the gallbladder with several tiny stones measuring up to about 5 mm. There is no intra or extrahepatic biliary duct dilatation. No MR features to suggest choledocholithiasis. Pancreas: No focal mass lesion. No dilatation of the main duct. No intraparenchymal cyst. No peripancreatic edema. Spleen:  No splenomegaly. No focal mass lesion. Adrenals/Urinary Tract: No adrenal nodule or mass. Left kidney unremarkable. Mild fullness of the right intrarenal collecting system and proximal right ureter noted (best seen on coronal  images 13-15 of series 10). Stomach/Bowel: Stomach is nondistended. No gastric wall thickening. No evidence of outlet obstruction. Duodenum is normally positioned as is the ligament of Treitz. Visualize small bowel loops and colonic segments of the abdomen are nondilated. Vascular/Lymphatic: No abdominal aortic aneurysm. No evidence lymphadenopathy in the abdomen. Other:  Single intrauterine gestation is incompletely visualized. Musculoskeletal: No abnormal marrow signal  within the visualized bony anatomy. IMPRESSION: 1. Tiny gallstones noted with dependent sludge in the gallbladder lumen. No evidence for pericholecystic fluid. 2. No intra or extrahepatic biliary duct dilatation. No MR evidence of choledocholithiasis. 3. Mild right hydroureteronephrosis. Electronically Signed   By: Kennith Center M.D.   On: 10/21/2016 19:36   Mr 3d Recon At Scanner  Result Date: 10/21/2016 CLINICAL DATA:  21 year old female with intrauterine gestation. Right upper quadrant pain with gallstones and acute right upper quadrant pain EXAM: MRI ABDOMEN WITHOUT CONTRAST  (INCLUDING MRCP) TECHNIQUE: Multiplanar multisequence MR imaging of the abdomen was performed. Heavily T2-weighted images of the biliary and pancreatic ducts were obtained, and three-dimensional MRCP images were rendered by post processing. COMPARISON:  Ultrasound exam from 10/18/2016. FINDINGS: Lower chest: Unremarkable. Hepatobiliary: No focal abnormality within the liver parenchyma. Layering sludge is identified in the gallbladder with several tiny stones measuring up to about 5 mm. There is no intra or extrahepatic biliary duct dilatation. No MR features to suggest choledocholithiasis. Pancreas: No focal mass lesion. No dilatation of the main duct. No intraparenchymal cyst. No peripancreatic edema. Spleen:  No splenomegaly. No focal mass lesion. Adrenals/Urinary Tract: No adrenal nodule or mass. Left kidney unremarkable. Mild fullness of the right intrarenal collecting system and proximal right ureter noted (best seen on coronal images 13-15 of series 10). Stomach/Bowel: Stomach is nondistended. No gastric wall thickening. No evidence of outlet obstruction. Duodenum is normally positioned as is the ligament of Treitz. Visualize small bowel loops and colonic segments of the abdomen are nondilated. Vascular/Lymphatic: No abdominal aortic aneurysm. No evidence lymphadenopathy in the abdomen. Other:  Single intrauterine gestation is  incompletely visualized. Musculoskeletal: No abnormal marrow signal within the visualized bony anatomy. IMPRESSION: 1. Tiny gallstones noted with dependent sludge in the gallbladder lumen. No evidence for pericholecystic fluid. 2. No intra or extrahepatic biliary duct dilatation. No MR evidence of choledocholithiasis. 3. Mild right hydroureteronephrosis. Electronically Signed   By: Kennith Center M.D.   On: 10/21/2016 19:36    Anti-infectives: Anti-infectives    Start     Dose/Rate Route Frequency Ordered Stop   10/22/16 1030  cefTRIAXone (ROCEPHIN) 2 g in dextrose 5 % 50 mL IVPB  Status:  Discontinued     2 g 100 mL/hr over 30 Minutes Intravenous  Once 10/22/16 1015 10/22/16 1346   10/18/16 1300  cefTRIAXone (ROCEPHIN) 2 g in dextrose 5 % 50 mL IVPB  Status:  Discontinued     2 g 100 mL/hr over 30 Minutes Intravenous Every 24 hours 10/18/16 1240 10/23/16 0728       Assessment/Plan Symptomatic Cholelithiasis Laparoscopic cholecystectomy 2/6 Dr. Derrell Lolling - POD 1 - AST/ALT slightly up today (64/109), bilirubin returned to WNL  Pregnant - about [redacted] weeks Gestational diabetes- on gliburide at home, SSI here Anemia - Hg 8.7, monitor  ID - rocephin 2/2>>2/7 FEN - soft diet advance as tolerated VTE - SCDs  Plan - D/c rocephin, patient does not need to be discharged on antibiotics. She is tolerating a soft diet and working on pain control today. She has not had a BM or passed any flatus. Encourage  mobilization. Once pain is controlled on PO meds and she is passing flatus she will be ready for discharge from our standpoint. She will follow-up with Dr. Derrell LollingIngram in 2-3 weeks.   LOS: 5 days    Edson SnowballBROOKE A White , [facility] 10/23/2016, 12:37 PM Pager: [phone] Consults: [phone] Mon-Fri 7:00 am-4:30 pm Sat-Sun 7:00 am-11:30 am

## 2016-10-23 NOTE — Discharge Instructions (Signed)
LAPAROSCOPIC SURGERY: POST OP INSTRUCTIONS  1. DIET: Follow a light bland diet the first 24 hours after arrival home, such as soup, liquids, crackers, etc. Be sure to include lots of fluids daily. Avoid fast food or heavy meals as your are more likely to get nauseated. Eat a low fat the next few days after surgery.  2. Take your usually prescribed home medications unless otherwise directed. 3. PAIN CONTROL:  1. Pain is best controlled by a usual combination of three different methods TOGETHER:  1. Ice/Heat 2. Over the counter pain medication 3. Prescription pain medication 2. Most patients will experience some swelling and bruising around the incisions. Ice packs or heating pads (30-60 minutes up to 6 times a day) will help. Use ice for the first few days to help decrease swelling and bruising, then switch to heat to help relax tight/sore spots and speed recovery. Some people prefer to use ice alone, heat alone, alternating between ice & heat. Experiment to what works for you. Swelling and bruising can take several weeks to resolve.  3. It is helpful to take an over-the-counter pain medication regularly for the first few weeks. Choose one of the following that works best for you:  1. Naproxen (Aleve, etc) Two 220mg  tabs twice a day 2. Ibuprofen (Advil, etc) Three 200mg  tabs four times a day (every meal & bedtime) 3. Acetaminophen (Tylenol, etc) 500-650mg  four times a day (every meal & bedtime) 4. A prescription for pain medication (such as oxycodone, hydrocodone, etc) should be given to you upon discharge. Take your pain medication as prescribed.  1. If you are having problems/concerns with the prescription medicine (does not control pain, nausea, vomiting, rash, itching, etc), please call us [phone] to see if we need to switch you to a different pain medicine that will work better for you and/or control your side effect better. 2. If you need a refill on your pain medication, please contact  your pharmacy. They will contact our office to request authorization. Prescriptions will not be filled after 5 pm or on week-ends. 4. Avoid getting constipated. Between the surgery and the pain medications, it is common to experience some constipation. Increasing fluid intake and taking a fiber supplement (such as Metamucil, Citrucel, FiberCon, MiraLax, etc) 1-2 times a day regularly will usually help prevent this problem from occurring. A mild laxative (prune juice, Milk of Magnesia, MiraLax, etc) should be taken according to package directions if there are no bowel movements after 48 hours.  5. Watch out for diarrhea. If you have many loose bowel movements, simplify your diet to bland foods & liquids for a few days. Stop any stool softeners and decrease your fiber supplement. Switching to mild anti-diarrheal medications (Kayopectate, Pepto Bismol) can help. If this worsens or does not improve, please call us. 6. Wash / shower every day. You may shower over the dressings as they are waterproof. Continue to shower over incision(s) after the dressing is off. 7. You may leave the incision open to air. You may replace a dressing/Band-Aid to cover the incision for comfort if you wish. Try not to pick the glue off and let it fall off naturally. 8. ACTIVITIES as tolerated:  1. You may resume regular (light) daily activities beginning the next day--such as daily self-care, walking, climbing stairs--gradually increasing activities as tolerated. If you can walk 30 minutes without difficulty, it is safe to try more intense activity such as jogging, treadmill, bicycling, low-impact aerobics, swimming, etc. 2. Save the most intensive  and strenuous activity for last such as sit-ups, heavy lifting, contact sports, etc Refrain from any heavy lifting or straining until you are off narcotics for pain control.  3. DO NOT PUSH THROUGH PAIN. Let pain be your guide: If it hurts to do something, don't do it. Pain is your body  warning you to avoid that activity for another week until the pain goes down. 4. You may drive when you are no longer taking prescription pain medication, you can comfortably wear a seatbelt, and you can safely maneuver your car and apply brakes. 5. You may have sexual intercourse when it is comfortable.  9. FOLLOW UP in our office  1. Please call [facility] at [phone] to set up an appointment to see your surgeon in the office for a follow-up appointment approximately 2-3 weeks after your surgery. 2. Make sure that you call for this appointment the day you arrive home to insure a convenient appointment time.      10. IF YOU HAVE DISABILITY OR FAMILY LEAVE FORMS, BRING THEM TO THE               OFFICE FOR PROCESSING.   WHEN TO CALL US [phone]:  1. Poor pain control 2. Reactions / problems with new medications (rash/itching, nausea, etc)  3. Fever over 101.5 F (38.5 C) 4. Inability to urinate 5. Nausea and/or vomiting 6. Worsening swelling or bruising 7. Continued bleeding from incision. 8. Increased pain, redness, or drainage from the incision  The clinic staff is available to answer your questions during regular business hours (8:30am-5pm). Please dont hesitate to call and ask to speak to one of our nurses for clinical concerns.  If you have a medical emergency, go to the nearest emergency room or call 911.  A surgeon from [facility] is always on call at the [facility]   [facility] [place]  [address], [address], [place], [place] [address] ?  MAIN: [phone] ? TOLL FREE: [phone] ?  FAX [phone]  [email or web address]

## 2016-10-23 NOTE — Progress Notes (Signed)
Pt to [facility]FM for ultrasound 

## 2016-10-23 NOTE — Progress Notes (Signed)
Patient ID: Connie White, female   DOB: May 11, 1996, 21 y.o.   MRN: [id number] ACULTY PRACTICE ANTEPARTUM COMPREHENSIVE PROGRESS NOTE  Connie White is a 21 y.o. [id number] at 105w0d  who is admitted for RUQ pain and cholecystitis.  Pt POD#1 from laparoscopic cholecystectomy Fetal presentation is variable. Length of Stay:  5  Days  Subjective: Right upper quadrant pain. Tolerated PO last night. Pain controlled.  Patient reports good fetal movement.  She reports no uterine contractions, no bleeding and no loss of fluid per vagina.  Vitals:  Blood pressure (!) 104/57, pulse 78, temperature 98.7 F (37.1 C), temperature source Oral, resp. rate 16, height 4' 7.5" (1.41 m), weight 158 lb (71.7 kg), last menstrual period 03/30/2016, SpO2 100 %. Physical Examination: General appearance - in mild to moderate distress Heart: regular rate, no murmur Lungs: clear to auscultation bilaterally, no wheezing. Abdomen - tender to palpation in upper quadrants Cervical Exam: Not evaluated.  Extremities: extremities normal, atraumatic, no cyanosis or edema  Membranes:intact  Fetal Monitoring:  Baseline: 145 bpm, Variability: Good {> 6 bpm), Accelerations: Non-reactive but appropriate for gestational age, Decelerations: Absent and TOCO: no ctxs  Labs:  Results for orders placed or performed during the hospital encounter of 10/18/16 (from the past 24 hour(s))  CBC   Collection Time: 10/22/16  5:22 AM  Result Value Ref Range   WBC 11.6 (H) 4.0 - 10.5 K/uL   RBC 4.05 3.87 - 5.11 MIL/uL   Hemoglobin 9.1 (L) 12.0 - 15.0 g/dL   HCT 14.7 (L) 82.9 - 56.2 %   MCV 71.1 (L) 78.0 - 100.0 fL   MCH 22.5 (L) 26.0 - 34.0 pg   MCHC 31.6 30.0 - 36.0 g/dL   RDW 13.0 86.5 - 78.4 %   Platelets 328 150 - 400 K/uL  Lipase, blood   Collection Time: 10/22/16  5:22 AM  Result Value Ref Range   Lipase 34 11 - 51 U/L  Comprehensive metabolic panel   Collection Time: 10/22/16  5:22 AM  Result Value Ref Range   Sodium 134 (L)  135 - 145 mmol/L   Potassium 3.6 3.5 - 5.1 mmol/L   Chloride 104 101 - 111 mmol/L   CO2 19 (L) 22 - 32 mmol/L   Glucose, Bld 80 65 - 99 mg/dL   BUN 6 6 - 20 mg/dL   Creatinine, Ser 6.96 0.44 - 1.00 mg/dL   Calcium 8.3 (L) 8.9 - 10.3 mg/dL   Total Protein 6.6 6.5 - 8.1 g/dL   Albumin 2.7 (L) 3.5 - 5.0 g/dL   AST 55 (H) 15 - 41 U/L   ALT 95 (H) 14 - 54 U/L   Alkaline Phosphatase 73 38 - 126 U/L   Total Bilirubin 1.6 (H) 0.3 - 1.2 mg/dL   GFR calc non Af Amer >60 >60 mL/min   GFR calc Af Amer >60 >60 mL/min   Anion gap 11 5 - 15  Glucose, capillary   Collection Time: 10/22/16 12:41 PM  Result Value Ref Range   Glucose-Capillary 113 (H) 65 - 99 mg/dL   Comment 1 Notify RN    Comment 2 Document in Chart   Glucose, capillary   Collection Time: 10/22/16  6:04 PM  Result Value Ref Range   Glucose-Capillary 89 65 - 99 mg/dL   Comment 1 Notify RN    Comment 2 Document in Chart   Glucose, capillary   Collection Time: 10/23/16 12:27 AM  Result Value Ref Range   Glucose-Capillary  83 65 - 99 mg/dL    Imaging Studies:    MRCP  Reviewed; no blockage noted; right hydronephrosis noted (OF NOTE: this is consistent with a normal uterine gestation)   Medications:  Scheduled . cefTRIAXone (ROCEPHIN)  IV  2 g Intravenous Q24H  . enoxaparin (LOVENOX) injection  40 mg Subcutaneous Q24H  . glyBURIDE  5 mg Oral QHS  . hydroxyprogesterone caproate  250 mg Intramuscular Weekly  . pantoprazole  40 mg Oral Daily  . prenatal vitamin w/FE, FA  1 tablet Oral Q1200  . senna  1 tablet Oral BID  . sodium chloride  1,000 mL Intravenous Once   I have reviewed the patient's current medications.  ASSESSMENT: Patient Active Problem List   Diagnosis Date Noted  . Cholecystitis with cholelithiasis 10/22/2016  . Gallstones 10/18/2016  . Pregnancy related hip pain, antepartum 10/16/2016  . Supervision of normal pregnancy, antepartum 07/10/2016  . H/O preterm delivery, currently pregnant 07/10/2016  .  Gestational diabetes 03/10/2014  . ANEMIA, OTHER, UNSPECIFIED 11/13/2006    PLAN: #1 s/p laparoscopic chole: POD#1  No complications from surgery  Pain controlled  On Rocephin (Day#4). Will need clarification from surgery as to how long to keep the patient on antibiotics  Advance diet per surgery - currently on soft diet #2 GDM A2  On glyburide 5mg  at bedtime  CBGs controlled. #3 IUP at [redacted] week gestation  NST reactive  Continue routine antenatal care.   Connie RaiderJacob J White 10/23/2016,5:08 AM

## 2016-10-23 NOTE — Discharge Summary (Signed)
Antenatal Physician Discharge Summary  Patient ID: Connie White MRN: [id number] DOB/AGE: 1996-08-27 21 y.o.  Admit date: 10/18/2016 Discharge date: 10/23/2016  Admission Diagnoses:  IUP at 27 weeks, cholecystitis  Discharge Diagnoses: IUP at 28 weeks, s/p laparoscopic cholescystectomy  Procedures: Procedure(s) (LRB): LAPAROSCOPIC CHOLECYSTECTOMY (N/A)   Consults: General  Surgery  Hospital Course:  This is a 21 y.o. [id number] with IUP at [redacted] weeks gestation admitted for cholelithiasis, cholecystitis and elevated LFTs. General Surgery was consulted and recommended medical management and observation.  She was initially treated with Rocephin and kept NPO and observed. Her pain persisted despite antibiotics and the decision was made to proceed with surgery.   She underwent a laparoscopic cholecystectomy as mentioned above, her operation was uncomplicated. For further details about surgery, please refer to the operative report. Patient had an uncomplicated postoperative course. By time of discharge on POD#1, her pain was controlled on oral pain medications; she was ambulating, voiding without difficulty, tolerating regular diet and passing flatus. The fetal heart rate monitoring remained reassuring, and she had no signs/symptoms of progressing preterm labor or other maternal-fetal concerns.  Her cervical exam was unchanged from admission.  She was deemed stable for discharge to home with outpatient follow up.  Significant Diagnostic Studies:  Results for orders placed or performed during the hospital encounter of 10/18/16 (from the past 168 hour(s))  Urinalysis, Routine w reflex microscopic   Collection Time: 10/18/16  5:10 AM  Result Value Ref Range   Color, Urine YELLOW YELLOW   APPearance HAZY (A) CLEAR   Specific Gravity, Urine 1.017 1.005 - 1.030   pH 7.0 5.0 - 8.0   Glucose, UA NEGATIVE NEGATIVE mg/dL   Hgb urine dipstick NEGATIVE NEGATIVE   Bilirubin Urine NEGATIVE NEGATIVE   Ketones, ur 5  (A) NEGATIVE mg/dL   Protein, ur NEGATIVE NEGATIVE mg/dL   Nitrite NEGATIVE NEGATIVE   Leukocytes, UA TRACE (A) NEGATIVE   RBC / HPF 0-5 0 - 5 RBC/hpf   WBC, UA 6-30 0 - 5 WBC/hpf   Bacteria, UA RARE (A) NONE SEEN   Squamous Epithelial / LPF 6-30 (A) NONE SEEN   Mucous PRESENT   CBC with Differential/Platelet   Collection Time: 10/18/16  6:10 AM  Result Value Ref Range   WBC 14.7 (H) 4.0 - 10.5 K/uL   RBC 4.75 3.87 - 5.11 MIL/uL   Hemoglobin 10.8 (L) 12.0 - 15.0 g/dL   HCT 14.7 (L) 82.9 - 56.2 %   MCV 69.9 (L) 78.0 - 100.0 fL   MCH 22.7 (L) 26.0 - 34.0 pg   MCHC 32.5 30.0 - 36.0 g/dL   RDW 13.0 86.5 - 78.4 %   Platelets 381 150 - 400 K/uL   Neutrophils Relative % 85 %   Neutro Abs 12.5 (H) 1.7 - 7.7 K/uL   Lymphocytes Relative 11 %   Lymphs Abs 1.6 0.7 - 4.0 K/uL   Monocytes Relative 3 %   Monocytes Absolute 0.4 0.1 - 1.0 K/uL   Eosinophils Relative 1 %   Eosinophils Absolute 0.2 0.0 - 0.7 K/uL   Basophils Relative 0 %   Basophils Absolute 0.0 0.0 - 0.1 K/uL  Comprehensive metabolic panel   Collection Time: 10/18/16  6:10 AM  Result Value Ref Range   Sodium 134 (L) 135 - 145 mmol/L   Potassium 4.5 3.5 - 5.1 mmol/L   Chloride 103 101 - 111 mmol/L   CO2 21 (L) 22 - 32 mmol/L   Glucose, Bld 148 (H) 65 -  99 mg/dL   BUN 7 6 - 20 mg/dL   Creatinine, Ser 1.300.47 0.44 - 1.00 mg/dL   Calcium 9.0 8.9 - 86.510.3 mg/dL   Total Protein 6.9 6.5 - 8.1 g/dL   Albumin 3.2 (L) 3.5 - 5.0 g/dL   AST 784112 (H) 15 - 41 U/L   ALT 70 (H) 14 - 54 U/L   Alkaline Phosphatase 78 38 - 126 U/L   Total Bilirubin 1.7 (H) 0.3 - 1.2 mg/dL   GFR calc non Af Amer >60 >60 mL/min   GFR calc Af Amer >60 >60 mL/min   Anion gap 10 5 - 15  Lipase, blood   Collection Time: 10/18/16  6:10 AM  Result Value Ref Range   Lipase 22 11 - 51 U/L  [facility]   Collection Time: 10/18/16  8:09 AM  Result Value Ref Range   ABO/RH(D) B POS    Antibody Screen NEG    Sample Expiration  10/21/2016   Glucose, capillary   Collection Time: 10/18/16  9:27 AM  Result Value Ref Range   Glucose-Capillary 116 (H) 65 - 99 mg/dL  Glucose, capillary   Collection Time: 10/18/16 11:58 AM  Result Value Ref Range   Glucose-Capillary 105 (H) 65 - 99 mg/dL  Glucose, capillary   Collection Time: 10/18/16  5:05 PM  Result Value Ref Range   Glucose-Capillary 98 65 - 99 mg/dL  Glucose, capillary   Collection Time: 10/18/16 10:07 PM  Result Value Ref Range   Glucose-Capillary 92 65 - 99 mg/dL  Glucose, capillary   Collection Time: 10/19/16 12:37 AM  Result Value Ref Range   Glucose-Capillary 102 (H) 65 - 99 mg/dL  Glucose, capillary   Collection Time: 10/19/16  5:02 AM  Result Value Ref Range   Glucose-Capillary 102 (H) 65 - 99 mg/dL  CBC   Collection Time: 10/19/16  5:25 AM  Result Value Ref Range   WBC 9.7 4.0 - 10.5 K/uL   RBC 4.43 3.87 - 5.11 MIL/uL   Hemoglobin 9.9 (L) 12.0 - 15.0 g/dL   HCT 69.631.2 (L) 29.536.0 - 28.446.0 %   MCV 70.4 (L) 78.0 - 100.0 fL   MCH 22.3 (L) 26.0 - 34.0 pg   MCHC 31.7 30.0 - 36.0 g/dL   RDW 13.213.7 44.011.5 - 10.215.5 %   Platelets 345 150 - 400 K/uL  Comprehensive metabolic panel   Collection Time: 10/19/16  5:25 AM  Result Value Ref Range   Sodium 135 135 - 145 mmol/L   Potassium 4.2 3.5 - 5.1 mmol/L   Chloride 103 101 - 111 mmol/L   CO2 23 22 - 32 mmol/L   Glucose, Bld 107 (H) 65 - 99 mg/dL   BUN 6 6 - 20 mg/dL   Creatinine, Ser 7.250.49 0.44 - 1.00 mg/dL   Calcium 9.0 8.9 - 36.610.3 mg/dL   Total Protein 6.2 (L) 6.5 - 8.1 g/dL   Albumin 2.9 (L) 3.5 - 5.0 g/dL   AST 61 (H) 15 - 41 U/L   ALT 90 (H) 14 - 54 U/L   Alkaline Phosphatase 83 38 - 126 U/L   Total Bilirubin 2.4 (H) 0.3 - 1.2 mg/dL   GFR calc non Af Amer >60 >60 mL/min   GFR calc Af Amer >60 >60 mL/min   Anion gap 9 5 - 15  Glucose, capillary   Collection Time: 10/19/16 12:16 PM  Result Value Ref Range   Glucose-Capillary 102 (H) 65 - 99 mg/dL  Glucose, capillary   Collection Time: 10/19/16   6:02 PM  Result Value Ref Range   Glucose-Capillary 87 65 - 99 mg/dL  Glucose, capillary   Collection Time: 10/19/16  8:48 PM  Result Value Ref Range   Glucose-Capillary 99 65 - 99 mg/dL  CBC with Differential/Platelet   Collection Time: 10/20/16  5:23 AM  Result Value Ref Range   WBC 10.2 4.0 - 10.5 K/uL   RBC 4.31 3.87 - 5.11 MIL/uL   Hemoglobin 9.7 (L) 12.0 - 15.0 g/dL   HCT 16.1 (L) 09.6 - 04.5 %   MCV 70.5 (L) 78.0 - 100.0 fL   MCH 22.5 (L) 26.0 - 34.0 pg   MCHC 31.9 30.0 - 36.0 g/dL   RDW 40.9 81.1 - 91.4 %   Platelets 328 150 - 400 K/uL   Neutrophils Relative % 79 %   Neutro Abs 8.0 (H) 1.7 - 7.7 K/uL   Lymphocytes Relative 14 %   Lymphs Abs 1.4 0.7 - 4.0 K/uL   Monocytes Relative 4 %   Monocytes Absolute 0.4 0.1 - 1.0 K/uL   Eosinophils Relative 3 %   Eosinophils Absolute 0.3 0.0 - 0.7 K/uL   Basophils Relative 0 %   Basophils Absolute 0.0 0.0 - 0.1 K/uL  Comprehensive metabolic panel   Collection Time: 10/20/16  5:23 AM  Result Value Ref Range   Sodium 135 135 - 145 mmol/L   Potassium 3.7 3.5 - 5.1 mmol/L   Chloride 102 101 - 111 mmol/L   CO2 23 22 - 32 mmol/L   Glucose, Bld 108 (H) 65 - 99 mg/dL   BUN 8 6 - 20 mg/dL   Creatinine, Ser 7.82 0.44 - 1.00 mg/dL   Calcium 8.9 8.9 - 95.6 mg/dL   Total Protein 6.5 6.5 - 8.1 g/dL   Albumin 2.9 (L) 3.5 - 5.0 g/dL   AST 51 (H) 15 - 41 U/L   ALT 88 (H) 14 - 54 U/L   Alkaline Phosphatase 76 38 - 126 U/L   Total Bilirubin 2.0 (H) 0.3 - 1.2 mg/dL   GFR calc non Af Amer >60 >60 mL/min   GFR calc Af Amer >60 >60 mL/min   Anion gap 10 5 - 15  Glucose, capillary   Collection Time: 10/20/16  5:47 AM  Result Value Ref Range   Glucose-Capillary 103 (H) 65 - 99 mg/dL  Glucose, capillary   Collection Time: 10/20/16 12:00 PM  Result Value Ref Range   Glucose-Capillary 97 65 - 99 mg/dL  Glucose, capillary   Collection Time: 10/20/16  6:36 PM  Result Value Ref Range   Glucose-Capillary 133 (H) 65 - 99 mg/dL  Glucose,  capillary   Collection Time: 10/20/16 11:04 PM  Result Value Ref Range   Glucose-Capillary 139 (H) 65 - 99 mg/dL  CBC with Differential/Platelet   Collection Time: 10/21/16  5:31 AM  Result Value Ref Range   WBC 11.1 (H) 4.0 - 10.5 K/uL   RBC 4.08 3.87 - 5.11 MIL/uL   Hemoglobin 9.5 (L) 12.0 - 15.0 g/dL   HCT 21.3 (L) 08.6 - 57.8 %   MCV 71.1 (L) 78.0 - 100.0 fL   MCH 23.3 (L) 26.0 - 34.0 pg   MCHC 32.8 30.0 - 36.0 g/dL   RDW 46.9 62.9 - 52.8 %   Platelets 304 150 - 400 K/uL   Neutrophils Relative % 77 %   Neutro Abs 8.5 (H) 1.7 - 7.7 K/uL   Lymphocytes Relative 14 %  Lymphs Abs 1.5 0.7 - 4.0 K/uL   Monocytes Relative 6 %   Monocytes Absolute 0.7 0.1 - 1.0 K/uL   Eosinophils Relative 4 %   Eosinophils Absolute 0.4 0.0 - 0.7 K/uL   Basophils Relative 0 %   Basophils Absolute 0.0 0.0 - 0.1 K/uL  Glucose, capillary   Collection Time: 10/21/16  5:40 AM  Result Value Ref Range   Glucose-Capillary 114 (H) 65 - 99 mg/dL  Glucose, capillary   Collection Time: 10/21/16 12:12 PM  Result Value Ref Range   Glucose-Capillary 110 (H) 65 - 99 mg/dL   Comment 1 Notify RN    Comment 2 Document in Chart   Glucose, capillary   Collection Time: 10/21/16  5:37 PM  Result Value Ref Range   Glucose-Capillary 92 65 - 99 mg/dL   Comment 1 Notify RN    Comment 2 Document in Chart   Surgical pcr screen   Collection Time: 10/22/16  1:35 AM  Result Value Ref Range   MRSA, PCR NEGATIVE NEGATIVE   Staphylococcus aureus NEGATIVE NEGATIVE  CBC   Collection Time: 10/22/16  5:22 AM  Result Value Ref Range   WBC 11.6 (H) 4.0 - 10.5 K/uL   RBC 4.05 3.87 - 5.11 MIL/uL   Hemoglobin 9.1 (L) 12.0 - 15.0 g/dL   HCT 40.9 (L) 81.1 - 91.4 %   MCV 71.1 (L) 78.0 - 100.0 fL   MCH 22.5 (L) 26.0 - 34.0 pg   MCHC 31.6 30.0 - 36.0 g/dL   RDW 78.2 95.6 - 21.3 %   Platelets 328 150 - 400 K/uL  Lipase, blood   Collection Time: 10/22/16  5:22 AM  Result Value Ref Range   Lipase 34 11 - 51 U/L  Comprehensive  metabolic panel   Collection Time: 10/22/16  5:22 AM  Result Value Ref Range   Sodium 134 (L) 135 - 145 mmol/L   Potassium 3.6 3.5 - 5.1 mmol/L   Chloride 104 101 - 111 mmol/L   CO2 19 (L) 22 - 32 mmol/L   Glucose, Bld 80 65 - 99 mg/dL   BUN 6 6 - 20 mg/dL   Creatinine, Ser 0.86 0.44 - 1.00 mg/dL   Calcium 8.3 (L) 8.9 - 10.3 mg/dL   Total Protein 6.6 6.5 - 8.1 g/dL   Albumin 2.7 (L) 3.5 - 5.0 g/dL   AST 55 (H) 15 - 41 U/L   ALT 95 (H) 14 - 54 U/L   Alkaline Phosphatase 73 38 - 126 U/L   Total Bilirubin 1.6 (H) 0.3 - 1.2 mg/dL   GFR calc non Af Amer >60 >60 mL/min   GFR calc Af Amer >60 >60 mL/min   Anion gap 11 5 - 15  Glucose, capillary   Collection Time: 10/22/16 12:41 PM  Result Value Ref Range   Glucose-Capillary 113 (H) 65 - 99 mg/dL   Comment 1 Notify RN    Comment 2 Document in Chart   Glucose, capillary   Collection Time: 10/22/16  6:04 PM  Result Value Ref Range   Glucose-Capillary 89 65 - 99 mg/dL   Comment 1 Notify RN    Comment 2 Document in Chart   Glucose, capillary   Collection Time: 10/23/16 12:27 AM  Result Value Ref Range   Glucose-Capillary 83 65 - 99 mg/dL  Comprehensive metabolic panel   Collection Time: 10/23/16  5:23 AM  Result Value Ref Range   Sodium 135 135 - 145 mmol/L   Potassium 3.6 3.5 -  5.1 mmol/L   Chloride 106 101 - 111 mmol/L   CO2 21 (L) 22 - 32 mmol/L   Glucose, Bld 60 (L) 65 - 99 mg/dL   BUN 7 6 - 20 mg/dL   Creatinine, Ser 6.29 0.44 - 1.00 mg/dL   Calcium 8.6 (L) 8.9 - 10.3 mg/dL   Total Protein 6.6 6.5 - 8.1 g/dL   Albumin 2.6 (L) 3.5 - 5.0 g/dL   AST 64 (H) 15 - 41 U/L   ALT 109 (H) 14 - 54 U/L   Alkaline Phosphatase 75 38 - 126 U/L   Total Bilirubin 1.1 0.3 - 1.2 mg/dL   GFR calc non Af Amer >60 >60 mL/min   GFR calc Af Amer >60 >60 mL/min   Anion gap 8 5 - 15  CBC   Collection Time: 10/23/16  5:23 AM  Result Value Ref Range   WBC 9.7 4.0 - 10.5 K/uL   RBC 3.93 3.87 - 5.11 MIL/uL   Hemoglobin 8.7 (L) 12.0 - 15.0  g/dL   HCT 52.8 (L) 41.3 - 24.4 %   MCV 71.2 (L) 78.0 - 100.0 fL   MCH 22.1 (L) 26.0 - 34.0 pg   MCHC 31.1 30.0 - 36.0 g/dL   RDW 01.0 27.2 - 53.6 %   Platelets 346 150 - 400 K/uL  Glucose, capillary   Collection Time: 10/23/16  5:49 AM  Result Value Ref Range   Glucose-Capillary 65 65 - 99 mg/dL  Glucose, capillary   Collection Time: 10/23/16  6:16 AM  Result Value Ref Range   Glucose-Capillary 78 65 - 99 mg/dL  Glucose, capillary   Collection Time: 10/23/16 12:24 PM  Result Value Ref Range   Glucose-Capillary 120 (H) 65 - 99 mg/dL   Comment 1 Notify RN    Comment 2 Document in Chart     Mr Abdomen Mrcp Wo Contrast  Result Date: 10/21/2016 CLINICAL DATA:  21 year old female with intrauterine gestation. Right upper quadrant pain with gallstones and acute right upper quadrant pain EXAM: MRI ABDOMEN WITHOUT CONTRAST  (INCLUDING MRCP) TECHNIQUE: Multiplanar multisequence MR imaging of the abdomen was performed. Heavily T2-weighted images of the biliary and pancreatic ducts were obtained, and three-dimensional MRCP images were rendered by post processing. COMPARISON:  Ultrasound exam from 10/18/2016. FINDINGS: Lower chest: Unremarkable. Hepatobiliary: No focal abnormality within the liver parenchyma. Layering sludge is identified in the gallbladder with several tiny stones measuring up to about 5 mm. There is no intra or extrahepatic biliary duct dilatation. No MR features to suggest choledocholithiasis. Pancreas: No focal mass lesion. No dilatation of the main duct. No intraparenchymal cyst. No peripancreatic edema. Spleen:  No splenomegaly. No focal mass lesion. Adrenals/Urinary Tract: No adrenal nodule or mass. Left kidney unremarkable. Mild fullness of the right intrarenal collecting system and proximal right ureter noted (best seen on coronal images 13-15 of series 10). Stomach/Bowel: Stomach is nondistended. No gastric wall thickening. No evidence of outlet obstruction. Duodenum is normally  positioned as is the ligament of Treitz. Visualize small bowel loops and colonic segments of the abdomen are nondilated. Vascular/Lymphatic: No abdominal aortic aneurysm. No evidence lymphadenopathy in the abdomen. Other:  Single intrauterine gestation is incompletely visualized. Musculoskeletal: No abnormal marrow signal within the visualized bony anatomy. IMPRESSION: 1. Tiny gallstones noted with dependent sludge in the gallbladder lumen. No evidence for pericholecystic fluid. 2. No intra or extrahepatic biliary duct dilatation. No MR evidence of choledocholithiasis. 3. Mild right hydroureteronephrosis. Electronically Signed   By: Jamison Oka.D.  On: 10/21/2016 19:36   Mr 3d Recon At Scanner  Result Date: 10/21/2016 CLINICAL DATA:  21 year old female with intrauterine gestation. Right upper quadrant pain with gallstones and acute right upper quadrant pain EXAM: MRI ABDOMEN WITHOUT CONTRAST  (INCLUDING MRCP) TECHNIQUE: Multiplanar multisequence MR imaging of the abdomen was performed. Heavily T2-weighted images of the biliary and pancreatic ducts were obtained, and three-dimensional MRCP images were rendered by post processing. COMPARISON:  Ultrasound exam from 10/18/2016. FINDINGS: Lower chest: Unremarkable. Hepatobiliary: No focal abnormality within the liver parenchyma. Layering sludge is identified in the gallbladder with several tiny stones measuring up to about 5 mm. There is no intra or extrahepatic biliary duct dilatation. No MR features to suggest choledocholithiasis. Pancreas: No focal mass lesion. No dilatation of the main duct. No intraparenchymal cyst. No peripancreatic edema. Spleen:  No splenomegaly. No focal mass lesion. Adrenals/Urinary Tract: No adrenal nodule or mass. Left kidney unremarkable. Mild fullness of the right intrarenal collecting system and proximal right ureter noted (best seen on coronal images 13-15 of series 10). Stomach/Bowel: Stomach is nondistended. No gastric wall  thickening. No evidence of outlet obstruction. Duodenum is normally positioned as is the ligament of Treitz. Visualize small bowel loops and colonic segments of the abdomen are nondilated. Vascular/Lymphatic: No abdominal aortic aneurysm. No evidence lymphadenopathy in the abdomen. Other:  Single intrauterine gestation is incompletely visualized. Musculoskeletal: No abnormal marrow signal within the visualized bony anatomy. IMPRESSION: 1. Tiny gallstones noted with dependent sludge in the gallbladder lumen. No evidence for pericholecystic fluid. 2. No intra or extrahepatic biliary duct dilatation. No MR evidence of choledocholithiasis. 3. Mild right hydroureteronephrosis. Electronically Signed   By: Kennith Center M.D.   On: 10/21/2016 19:36     Discharge Condition: Stable  Disposition: 01-Home or Self Care    Allergies as of 10/23/2016   No Known Allergies     Medication List    TAKE these medications   ACCU-CHEK FASTCLIX LANCETS Misc TEST four times a day   ACCU-CHEK SMARTVIEW test strip Generic drug:  glucose blood TEST four times a day FASTING AND 2 HOURS AFTER EACH MEAL   acetaminophen 500 MG tablet Commonly known as:  TYLENOL Take 1,000 mg by mouth every 6 (six) hours as needed for mild pain or headache.   cyclobenzaprine 10 MG tablet Commonly known as:  FLEXERIL Take 1 tablet (10 mg total) by mouth every 8 (eight) hours as needed for muscle spasms.   docusate sodium 100 MG capsule Commonly known as:  COLACE Take 1 capsule (100 mg total) by mouth 2 (two) times daily as needed.   glyBURIDE 2.5 MG tablet Commonly known as:  DIABETA Take 5 mg by mouth at bedtime.   HYDROcodone-acetaminophen 5-325 MG tablet Commonly known as:  NORCO/VICODIN Take 2 tablets by mouth every 6 (six) hours as needed for moderate pain or severe pain.   ondansetron 4 MG disintegrating tablet Commonly known as:  ZOFRAN ODT Take 1 tablet (4 mg total) by mouth every 8 (eight) hours as needed for  nausea or vomiting.   pantoprazole 40 MG tablet Commonly known as:  PROTONIX Take 1 tablet (40 mg total) by mouth daily.   VITAFOL GUMMIES 3.33-0.333-34.8 MG Chew Chew 3 tablets by mouth daily before breakfast.      Follow-up Information    Ernestene Mention, MD. Go on 11/05/2016.   Specialty:  General Surgery Why:  Your appointment is 11/05/16 at 430PM. Please arrive 30 minutes prior to your appointment to check in and fill  out necessary paperwork. Contact information: [address] [place] [address] [phone]        Scheryl Darter, MD Follow up on 10/31/2016.   Specialty:  Obstetrics and Gynecology Why:  8:45 am for OB visit and 17P Contact information: [address] [place] [address] [phone]           Signed: Jaynie Collins M.D. 10/23/2016, 7:02 PM

## 2016-10-31 ENCOUNTER — Ambulatory Visit (INDEPENDENT_AMBULATORY_CARE_PROVIDER_SITE_OTHER): Payer: Medicaid Other | Admitting: Obstetrics & Gynecology

## 2016-10-31 VITALS — BP 108/68 | HR 89 | Wt 150.4 lb

## 2016-10-31 DIAGNOSIS — O09213 Supervision of pregnancy with history of pre-term labor, third trimester: Secondary | ICD-10-CM

## 2016-10-31 DIAGNOSIS — Z3490 Encounter for supervision of normal pregnancy, unspecified, unspecified trimester: Secondary | ICD-10-CM

## 2016-10-31 DIAGNOSIS — O24415 Gestational diabetes mellitus in pregnancy, controlled by oral hypoglycemic drugs: Secondary | ICD-10-CM

## 2016-10-31 MED ORDER — HYDROXYPROGESTERONE CAPROATE 250 MG/ML IM OIL
250.0000 mg | TOPICAL_OIL | Freq: Once | INTRAMUSCULAR | Status: AC
  Administered 2016-10-31: 250 mg via INTRAMUSCULAR

## 2016-10-31 NOTE — Patient Instructions (Signed)
Third Trimester of Pregnancy The third trimester is from week 29 through week 40 (months 7 through 9). The third trimester is a time when the unborn baby (fetus) is growing rapidly. At the end of the ninth month, the fetus is about 20 inches in length and weighs 6-10 pounds. Body changes during your third trimester Your body goes through many changes during pregnancy. The changes vary from woman to woman. During the third trimester:  Your weight will continue to increase. You can expect to gain 25-35 pounds (11-16 kg) by the end of the pregnancy.  You may begin to get stretch marks on your hips, abdomen, and breasts.  You may urinate more often because the fetus is moving lower into your pelvis and pressing on your bladder.  You may develop or continue to have heartburn. This is caused by increased hormones that slow down muscles in the digestive tract.  You may develop or continue to have constipation because increased hormones slow digestion and cause the muscles that push waste through your intestines to relax.  You may develop hemorrhoids. These are swollen veins (varicose veins) in the rectum that can itch or be painful.  You may develop swollen, bulging veins (varicose veins) in your legs.  You may have increased body aches in the pelvis, back, or thighs. This is due to weight gain and increased hormones that are relaxing your joints.  You may have changes in your hair. These can include thickening of your hair, rapid growth, and changes in texture. Some women also have hair loss during or after pregnancy, or hair that feels dry or thin. Your hair will most likely return to normal after your baby is born.  Your breasts will continue to grow and they will continue to become tender. A yellow fluid (colostrum) may leak from your breasts. This is the first milk you are producing for your baby.  Your belly button may stick out.  You may notice more swelling in your hands, face, or  ankles.  You may have increased tingling or numbness in your hands, arms, and legs. The skin on your belly may also feel numb.  You may feel short of breath because of your expanding uterus.  You may have more problems sleeping. This can be caused by the size of your belly, increased need to urinate, and an increase in your body's metabolism.  You may notice the fetus "dropping," or moving lower in your abdomen.  You may have increased vaginal discharge.  Your cervix becomes thin and soft (effaced) near your due date. What to expect at prenatal visits You will have prenatal exams every 2 weeks until week 36. Then you will have weekly prenatal exams. During a routine prenatal visit:  You will be weighed to make sure you and the fetus are growing normally.  Your blood pressure will be taken.  Your abdomen will be measured to track your baby's growth.  The fetal heartbeat will be listened to.  Any test results from the previous visit will be discussed.  You may have a cervical check near your due date to see if you have effaced. At around 36 weeks, your health care provider will check your cervix. At the same time, your health care provider will also perform a test on the secretions of the vaginal tissue. This test is to determine if a type of bacteria, Group B streptococcus, is present. Your health care provider will explain this further. Your health care provider may ask you:    What your birth plan is.  How you are feeling.  If you are feeling the baby move.  If you have had any abnormal symptoms, such as leaking fluid, bleeding, severe headaches, or abdominal cramping.  If you are using any tobacco products, including cigarettes, chewing tobacco, and electronic cigarettes.  If you have any questions. Other tests or screenings that may be performed during your third trimester include:  Blood tests that check for low iron levels (anemia).  Fetal testing to check the health,  activity level, and growth of the fetus. Testing is done if you have certain medical conditions or if there are problems during the pregnancy.  Nonstress test (NST). This test checks the health of your baby to make sure there are no signs of problems, such as the baby not getting enough oxygen. During this test, a belt is placed around your belly. The baby is made to move, and its heart rate is monitored during movement. What is false labor? False labor is a condition in which you feel small, irregular tightenings of the muscles in the womb (contractions) that eventually go away. These are called Braxton Hicks contractions. Contractions may last for hours, days, or even weeks before true labor sets in. If contractions come at regular intervals, become more frequent, increase in intensity, or become painful, you should see your health care provider. What are the signs of labor?  Abdominal cramps.  Regular contractions that start at 10 minutes apart and become stronger and more frequent with time.  Contractions that start on the top of the uterus and spread down to the lower abdomen and back.  Increased pelvic pressure and dull back pain.  A watery or bloody mucus discharge that comes from the vagina.  Leaking of amniotic fluid. This is also known as your "water breaking." It could be a slow trickle or a gush. Let your doctor know if it has a color or strange odor. If you have any of these signs, call your health care provider right away, even if it is before your due date. Follow these instructions at home: Eating and drinking  Continue to eat regular, healthy meals.  Do not eat:  Raw meat or meat spreads.  Unpasteurized milk or cheese.  Unpasteurized juice.  Store-made salad.  Refrigerated smoked seafood.  Hot dogs or deli meat, unless they are piping hot.  More than 6 ounces of albacore tuna a week.  Shark, swordfish, king mackerel, or tile fish.  Store-made salads.  Raw  sprouts, such as mung bean or alfalfa sprouts.  Take prenatal vitamins as told by your health care provider.  Take 1000 mg of calcium daily as told by your health care provider.  If you develop constipation:  Take over-the-counter or prescription medicines.  Drink enough fluid to keep your urine clear or pale yellow.  Eat foods that are high in fiber, such as fresh fruits and vegetables, whole grains, and beans.  Limit foods that are high in fat and processed sugars, such as fried and sweet foods. Activity  Exercise only as directed by your health care provider. Healthy pregnant women should aim for 2 hours and 30 minutes of moderate exercise per week. If you experience any pain or discomfort while exercising, stop.  Avoid heavy lifting.  Do not exercise in extreme heat or humidity, or at high altitudes.  Wear low-heel, comfortable shoes.  Practice good posture.  Do not travel far distances unless it is absolutely necessary and only with the approval   of your health care provider.  Wear your seat belt at all times while in a car, on a bus, or on a plane.  Take frequent breaks and rest with your legs elevated if you have leg cramps or low back pain.  Do not use hot tubs, steam rooms, or saunas.  You may continue to have sex unless your health care provider tells you otherwise. Lifestyle  Do not use any products that contain nicotine or tobacco, such as cigarettes and e-cigarettes. If you need help quitting, ask your health care provider.  Do not drink alcohol.  Do not use any medicinal herbs or unprescribed drugs. These chemicals affect the formation and growth of the baby.  If you develop varicose veins:  Wear support pantyhose or compression stockings as told by your healthcare provider.  Elevate your feet for 15 minutes, 3-4 times a day.  Wear a supportive maternity bra to help with breast tenderness. General instructions  Take over-the-counter and prescription  medicines only as told by your health care provider. There are medicines that are either safe or unsafe to take during pregnancy.  Take warm sitz baths to soothe any pain or discomfort caused by hemorrhoids. Use hemorrhoid cream or witch hazel if your health care provider approves.  Avoid cat litter boxes and soil used by cats. These carry germs that can cause birth defects in the baby. If you have a cat, ask someone to clean the litter box for you.  To prepare for the arrival of your baby:  Take prenatal classes to understand, practice, and ask questions about the labor and delivery.  Make a trial run to the hospital.  Visit the hospital and tour the maternity area.  Arrange for maternity or paternity leave through employers.  Arrange for family and friends to take care of pets while you are in the hospital.  Purchase a rear-facing car seat and make sure you know how to install it in your car.  Pack your hospital bag.  Prepare the baby's nursery. Make sure to remove all pillows and stuffed animals from the baby's crib to prevent suffocation.  Visit your dentist if you have not gone during your pregnancy. Use a soft toothbrush to brush your teeth and be gentle when you floss.  Keep all prenatal follow-up visits as told by your health care provider. This is important. Contact a health care provider if:  You are unsure if you are in labor or if your water has broken.  You become dizzy.  You have mild pelvic cramps, pelvic pressure, or nagging pain in your abdominal area.  You have lower back pain.  You have persistent nausea, vomiting, or diarrhea.  You have an unusual or bad smelling vaginal discharge.  You have pain when you urinate. Get help right away if:  You have a fever.  You are leaking fluid from your vagina.  You have spotting or bleeding from your vagina.  You have severe abdominal pain or cramping.  You have rapid weight loss or weight gain.  You have  shortness of breath with chest pain.  You notice sudden or extreme swelling of your face, hands, ankles, feet, or legs.  Your baby makes fewer than 10 movements in 2 hours.  You have severe headaches that do not go away with medicine.  You have vision changes. Summary  The third trimester is from week 29 through week 40, months 7 through 9. The third trimester is a time when the unborn baby (fetus)   is growing rapidly.  During the third trimester, your discomfort may increase as you and your baby continue to gain weight. You may have abdominal, leg, and back pain, sleeping problems, and an increased need to urinate.  During the third trimester your breasts will keep growing and they will continue to become tender. A yellow fluid (colostrum) may leak from your breasts. This is the first milk you are producing for your baby.  False labor is a condition in which you feel small, irregular tightenings of the muscles in the womb (contractions) that eventually go away. These are called Braxton Hicks contractions. Contractions may last for hours, days, or even weeks before true labor sets in.  Signs of labor can include: abdominal cramps; regular contractions that start at 10 minutes apart and become stronger and more frequent with time; watery or bloody mucus discharge that comes from the vagina; increased pelvic pressure and dull back pain; and leaking of amniotic fluid. This information is not intended to replace advice given to you by your health care provider. Make sure you discuss any questions you have with your health care provider. Document Released: 08/27/2001 Document Revised: 02/08/2016 Document Reviewed: 11/03/2012 Elsevier Interactive Patient Education  2017 [employer]  

## 2016-10-31 NOTE — Progress Notes (Signed)
   PRENATAL VISIT NOTE  Subjective:  Connie White is a 21 y.o. [id number] at 1937w2d being seen today for ongoing prenatal care.  She is currently monitored for the following issues for this high-risk pregnancy and has ANEMIA, OTHER, UNSPECIFIED; Gestational diabetes; Supervision of normal pregnancy, antepartum; H/O preterm delivery, currently pregnant; Pregnancy related hip pain, antepartum; Gallstones; and Cholecystitis with cholelithiasis on her problem list.  Patient reports sore after LS GB surgery.  Contractions: Not present. Vag. Bleeding: None.  Movement: Present. Denies leaking of fluid.   The following portions of the patient's history were reviewed and updated as appropriate: allergies, current medications, past family history, past medical history, past social history, past surgical history and problem list. Problem list updated.  Objective:   Vitals:   10/31/16 0850  BP: 108/68  Pulse: 89  Weight: 150 lb 6.4 oz (68.2 kg)    Fetal Status:     Movement: Present     General:  Alert, oriented and cooperative. Patient is in no acute distress.  Skin: Skin is warm and dry. No rash noted.   Cardiovascular: Normal heart rate noted  Respiratory: Normal respiratory effort, no problems with respiration noted  Abdomen: Soft, gravid, appropriate for gestational age. Pain/Pressure: Present     Pelvic:  Cervical exam deferred        Extremities: Normal range of motion.  Edema: None  Mental Status: Normal mood and affect. Normal behavior. Normal judgment and thought content.   Assessment and Plan:  Pregnancy: [id number] at 2237w2d  1. Gestational diabetes mellitus (GDM) in second trimester controlled on oral hypoglycemic drug Still poor compliance with BG testing. - US MFM OB FOLLOW UP; Future  2. Encounter for supervision of normal pregnancy, antepartum, unspecified gravidity 17 p weekly - US MFM OB FOLLOW UP; Future  Preterm labor symptoms and general obstetric precautions including  but not limited to vaginal bleeding, contractions, leaking of fluid and fetal movement were reviewed in detail with the patient. Please refer to After Visit Summary for other counseling recommendations.  Return in about 2 weeks (around 11/14/2016).   Adam PhenixJames G Kysen Wetherington, MD

## 2016-10-31 NOTE — Progress Notes (Signed)
GDM pt presents for ROB and 17p. S/P Laparoscopic Cholecystectomy 10/22/16. Pt doing well. CBG's available.

## 2016-11-07 ENCOUNTER — Ambulatory Visit: Payer: Medicaid Other

## 2016-11-07 DIAGNOSIS — Z3480 Encounter for supervision of other normal pregnancy, unspecified trimester: Secondary | ICD-10-CM

## 2016-11-07 NOTE — Progress Notes (Signed)
Patient presents for her 17P Injection. [place] Patient tolerated well. Administrations This Visit    hydroxyprogesterone caproate (MAKENA) 250 mg/mL injection 250 mg    Admin Date 11/07/2016 Action Given Dose 250 mg Route Intramuscular Administered By Maretta Beesarol J McGlashan, RMA

## 2016-11-14 ENCOUNTER — Ambulatory Visit: Payer: Medicaid Other

## 2016-11-14 DIAGNOSIS — O09219 Supervision of pregnancy with history of pre-term labor, unspecified trimester: Principal | ICD-10-CM

## 2016-11-14 DIAGNOSIS — O09899 Supervision of other high risk pregnancies, unspecified trimester: Secondary | ICD-10-CM

## 2016-11-14 NOTE — Progress Notes (Signed)
[facility]  Shot given in [facility]. Patient tolerated well. Administrations This Visit    hydroxyprogesterone caproate (MAKENA) 250 mg/mL injection 250 mg    Admin Date 11/14/2016 Action Given Dose 250 mg Route Intramuscular Administered By Maretta Beesarol J Charell Faulk, RMA

## 2016-11-21 ENCOUNTER — Ambulatory Visit (INDEPENDENT_AMBULATORY_CARE_PROVIDER_SITE_OTHER): Payer: Medicaid Other | Admitting: Obstetrics & Gynecology

## 2016-11-21 ENCOUNTER — Encounter (HOSPITAL_COMMUNITY): Payer: Self-pay

## 2016-11-21 ENCOUNTER — Ambulatory Visit (HOSPITAL_COMMUNITY)
Admission: RE | Admit: 2016-11-21 | Discharge: 2016-11-21 | Disposition: A | Discharge status: Home / Self Care | Payer: Medicaid Other | Source: Ambulatory Visit | Attending: Obstetrics & Gynecology | Admitting: Obstetrics & Gynecology

## 2016-11-21 ENCOUNTER — Other Ambulatory Visit (HOSPITAL_COMMUNITY): Payer: Self-pay | Admitting: *Deleted

## 2016-11-21 VITALS — BP 114/73 | HR 75 | Wt 160.0 lb

## 2016-11-21 DIAGNOSIS — O09899 Supervision of other high risk pregnancies, unspecified trimester: Secondary | ICD-10-CM

## 2016-11-21 DIAGNOSIS — O09213 Supervision of pregnancy with history of pre-term labor, third trimester: Secondary | ICD-10-CM | POA: Diagnosis not present

## 2016-11-21 DIAGNOSIS — O09219 Supervision of pregnancy with history of pre-term labor, unspecified trimester: Secondary | ICD-10-CM

## 2016-11-21 DIAGNOSIS — Z3A32 32 weeks gestation of pregnancy: Secondary | ICD-10-CM | POA: Insufficient documentation

## 2016-11-21 DIAGNOSIS — O09293 Supervision of pregnancy with other poor reproductive or obstetric history, third trimester: Secondary | ICD-10-CM | POA: Insufficient documentation

## 2016-11-21 DIAGNOSIS — O24415 Gestational diabetes mellitus in pregnancy, controlled by oral hypoglycemic drugs: Secondary | ICD-10-CM

## 2016-11-21 DIAGNOSIS — O99213 Obesity complicating pregnancy, third trimester: Secondary | ICD-10-CM | POA: Diagnosis not present

## 2016-11-21 DIAGNOSIS — O0993 Supervision of high risk pregnancy, unspecified, third trimester: Secondary | ICD-10-CM

## 2016-11-21 DIAGNOSIS — Z3490 Encounter for supervision of normal pregnancy, unspecified, unspecified trimester: Secondary | ICD-10-CM

## 2016-11-21 MED ORDER — HYDROXYPROGESTERONE CAPROATE 250 MG/ML IM OIL: 250.0000 mg | TOPICAL_OIL | Freq: Once | INTRAMUSCULAR | Status: DC

## 2016-11-21 MED ORDER — METFORMIN HCL 500 MG PO TABS
500.0000 mg | ORAL_TABLET | Freq: Two times a day (BID) | ORAL | 5 refills | Status: DC
Start: 2016-11-21 — End: 2016-12-21

## 2016-11-21 MED ORDER — GLYBURIDE 5 MG PO TABS: 5.0000 mg | ORAL_TABLET | Freq: Two times a day (BID) | ORAL | 2 refills | Status: DC

## 2016-11-21 NOTE — Progress Notes (Signed)
   PRENATAL VISIT NOTE  Subjective:  Connie White is a 21 y.o. [id number] at 6159w2d being seen today for ongoing prenatal care.  She is currently monitored for the following issues for this high-risk pregnancy and has Gestational diabetes mellitus (GDM) in third trimester controlled on oral hypoglycemic drug; Supervision of high risk pregnancy in third trimester; H/O preterm delivery, currently pregnant; Pregnancy related hip pain, antepartum; and Cholelithiasis and cholecystitis affecting pregnancy in second trimester, antepartum on her problem list.  Patient reports hip pain; already taking Tylenol and Flexeril.  Contractions: Irritability. Vag. Bleeding: None.  Movement: Present. Denies leaking of fluid.   The following portions of the patient's history were reviewed and updated as appropriate: allergies, current medications, past family history, past medical history, past social history, past surgical history and problem list. Problem list updated.  Objective:   Vitals:   11/21/16 0818  BP: 114/73  Pulse: 75  Weight: 160 lb (72.6 kg)    Fetal Status: Fetal Heart Rate (bpm): 145   Movement: Present     General:  Alert, oriented and cooperative. Patient is in no acute distress.  Skin: Skin is warm and dry. No rash noted.   Cardiovascular: Normal heart rate noted  Respiratory: Normal respiratory effort, no problems with respiration noted  Abdomen: Soft, gravid, appropriate for gestational age. Pain/Pressure: Present     Pelvic:  Cervical exam deferred        Extremities: Normal range of motion.  Edema: None  Mental Status: Normal mood and affect. Normal behavior. Normal judgment and thought content.   BS log: Elevated fasting 140s-150s, scant PP recorded but all above 140. NST performed today was reviewed and was found to be reactive.  Continue recommended antenatal testing and prenatal care.  Assessment and Plan:  Pregnancy: [id number] at 6859w2d  1. Gestational diabetes mellitus (GDM)  in third trimester controlled on oral hypoglycemic drug Increased Glyburide 5 mg bid and added Metformin to regimen, patient declines insulin for now.   Hypoglycemic precautions advised. Emphasized need for checking BS to ensure good glycemic control. Antenatal testing started today, growth scan today.  - Fetal nonstress test - US MFM FETAL BPP WO NON STRESS; Future - US MFM FETAL BPP WO NON STRESS; Future - metFORMIN (GLUCOPHAGE) 500 MG tablet; Take 1 tablet (500 mg total) by mouth 2 (two) times daily with a meal.  Dispense: 60 tablet; Refill: 5 - glyBURIDE (DIABETA) 5 MG tablet; Take 1 tablet (5 mg total) by mouth 2 (two) times daily with a meal.  Dispense: 60 tablet; Refill: 2  2. H/O preterm delivery, currently pregnant Continue weekly 17P  3. Supervision of high risk pregnancy in third trimester Preterm labor symptoms and general obstetric precautions including but not limited to vaginal bleeding, contractions, leaking of fluid and fetal movement were reviewed in detail with the patient. Please refer to After Visit Summary for other counseling recommendations.  Return for BPP at [facility]FM on 3/12 then OB Visit/NST/17P on Thurs 3/15. This pattern needs to be repeated weekly.Connie White.   Kaizer Dissinger A Milen Lengacher, MD

## 2016-11-21 NOTE — Progress Notes (Signed)
Patient complains of daily headaches. 17P Injection given in LUOQ. Patient tolerated well. Placed on NST machine.   Administrations This Visit    hydroxyprogesterone caproate (MAKENA) 250 mg/mL injection 250 mg    Admin Date 11/21/2016 Action Given Dose 250 mg Route Intramuscular Administered By Maretta Beesarol J Kimmy Totten, RMA

## 2016-11-25 ENCOUNTER — Encounter (HOSPITAL_COMMUNITY): Payer: Self-pay

## 2016-11-25 ENCOUNTER — Ambulatory Visit (HOSPITAL_COMMUNITY): Payer: Medicaid Other

## 2016-11-25 ENCOUNTER — Ambulatory Visit (HOSPITAL_COMMUNITY)
Admission: RE | Admit: 2016-11-25 | Discharge: 2016-11-25 | Disposition: A | Discharge status: Home / Self Care | Payer: Medicaid Other | Source: Ambulatory Visit | Attending: Obstetrics & Gynecology | Admitting: Obstetrics & Gynecology

## 2016-11-25 DIAGNOSIS — Z3A32 32 weeks gestation of pregnancy: Secondary | ICD-10-CM | POA: Diagnosis not present

## 2016-11-25 DIAGNOSIS — O24415 Gestational diabetes mellitus in pregnancy, controlled by oral hypoglycemic drugs: Secondary | ICD-10-CM | POA: Insufficient documentation

## 2016-11-25 DIAGNOSIS — O09293 Supervision of pregnancy with other poor reproductive or obstetric history, third trimester: Secondary | ICD-10-CM | POA: Insufficient documentation

## 2016-11-25 DIAGNOSIS — O99213 Obesity complicating pregnancy, third trimester: Secondary | ICD-10-CM | POA: Insufficient documentation

## 2016-11-28 ENCOUNTER — Ambulatory Visit: Payer: Medicaid Other

## 2016-11-28 DIAGNOSIS — O09219 Supervision of pregnancy with history of pre-term labor, unspecified trimester: Principal | ICD-10-CM

## 2016-11-28 DIAGNOSIS — O09899 Supervision of other high risk pregnancies, unspecified trimester: Secondary | ICD-10-CM

## 2016-11-28 NOTE — Progress Notes (Signed)
Patient presents for 17P Injection. Given in RUOQ.  Tolerated well. Administrations This Visit    hydroxyprogesterone caproate (MAKENA) 250 mg/mL injection 250 mg    Admin Date 11/28/2016 Action Given Dose 250 mg Route Intramuscular Administered By Maretta Beesarol J McGlashan, RMA

## 2016-12-02 ENCOUNTER — Other Ambulatory Visit (HOSPITAL_COMMUNITY): Payer: Self-pay

## 2016-12-02 ENCOUNTER — Ambulatory Visit (HOSPITAL_COMMUNITY)
Admission: RE | Admit: 2016-12-02 | Discharge: 2016-12-02 | Disposition: A | Discharge status: Home / Self Care | Payer: Medicaid Other | Source: Ambulatory Visit | Attending: Obstetrics & Gynecology | Admitting: Obstetrics & Gynecology

## 2016-12-02 ENCOUNTER — Encounter (HOSPITAL_COMMUNITY): Payer: Self-pay

## 2016-12-02 DIAGNOSIS — O24415 Gestational diabetes mellitus in pregnancy, controlled by oral hypoglycemic drugs: Secondary | ICD-10-CM | POA: Diagnosis present

## 2016-12-02 DIAGNOSIS — O99213 Obesity complicating pregnancy, third trimester: Secondary | ICD-10-CM | POA: Insufficient documentation

## 2016-12-02 DIAGNOSIS — Z3A33 33 weeks gestation of pregnancy: Secondary | ICD-10-CM | POA: Diagnosis not present

## 2016-12-05 ENCOUNTER — Ambulatory Visit (INDEPENDENT_AMBULATORY_CARE_PROVIDER_SITE_OTHER): Payer: Medicaid Other | Admitting: Obstetrics & Gynecology

## 2016-12-05 VITALS — BP 119/76 | HR 99 | Wt 161.0 lb

## 2016-12-05 DIAGNOSIS — O24415 Gestational diabetes mellitus in pregnancy, controlled by oral hypoglycemic drugs: Secondary | ICD-10-CM

## 2016-12-05 DIAGNOSIS — O09899 Supervision of other high risk pregnancies, unspecified trimester: Secondary | ICD-10-CM

## 2016-12-05 DIAGNOSIS — O0993 Supervision of high risk pregnancy, unspecified, third trimester: Secondary | ICD-10-CM

## 2016-12-05 DIAGNOSIS — O26613 Liver and biliary tract disorders in pregnancy, third trimester: Secondary | ICD-10-CM

## 2016-12-05 DIAGNOSIS — K8020 Calculus of gallbladder without cholecystitis without obstruction: Secondary | ICD-10-CM

## 2016-12-05 DIAGNOSIS — O09213 Supervision of pregnancy with history of pre-term labor, third trimester: Secondary | ICD-10-CM

## 2016-12-05 DIAGNOSIS — O09219 Supervision of pregnancy with history of pre-term labor, unspecified trimester: Principal | ICD-10-CM

## 2016-12-05 DIAGNOSIS — O26612 Liver and biliary tract disorders in pregnancy, second trimester: Secondary | ICD-10-CM

## 2016-12-05 MED ORDER — BUTALBITAL-APAP-CAFFEINE 50-325-40 MG PO TABS: 1.0000 | ORAL_TABLET | Freq: Four times a day (QID) | ORAL | 0 refills | Status: DC | PRN

## 2016-12-05 MED ORDER — GLYBURIDE 5 MG PO TABS: 7.5000 mg | ORAL_TABLET | Freq: Two times a day (BID) | ORAL | 2 refills | Status: DC

## 2016-12-05 NOTE — Progress Notes (Signed)
Patient complains of having headaches every day, Tylenol extra strength does not help.  17P given in LUOQ. Tolerated well.  Administrations This Visit    hydroxyprogesterone caproate (MAKENA) 250 mg/mL injection 250 mg    Admin Date 12/05/2016 Action Given Dose 250 mg Route Intramuscular Administered By Maretta Beesarol J Jadarius Commons, RMA

## 2016-12-05 NOTE — Progress Notes (Signed)
   PRENATAL VISIT NOTE  Subjective:  Connie White is a 21 y.o. [id number] at 632w2d being seen today for ongoing prenatal care.  She is currently monitored for the following issues for this high-risk pregnancy and has Gestational diabetes mellitus (GDM) in third trimester controlled on oral hypoglycemic drug; Supervision of high risk pregnancy in third trimester; H/O preterm delivery, currently pregnant; Pregnancy related hip pain, antepartum; and Cholelithiasis and cholecystitis affecting pregnancy in second trimester, antepartum on her problem list.  Patient reports headache.  Contractions: Irregular. Vag. Bleeding: None.  Movement: Present. Denies leaking of fluid.   The following portions of the patient's history were reviewed and updated as appropriate: allergies, current medications, past family history, past medical history, past social history, past surgical history and problem list. Problem list updated.  Objective:   Vitals:   12/05/16 0943  BP: 119/76  Pulse: 99  Weight: 161 lb (73 kg)    Fetal Status: Fetal Heart Rate (bpm): 150   Movement: Present     General:  Alert, oriented and cooperative. Patient is in no acute distress.  Skin: Skin is warm and dry. No rash noted.   Cardiovascular: Normal heart rate noted  Respiratory: Normal respiratory effort, no problems with respiration noted  Abdomen: Soft, gravid, appropriate for gestational age. Pain/Pressure: Present     Pelvic:  Cervical exam deferred        Extremities: Normal range of motion.  Edema: None  Mental Status: Normal mood and affect. Normal behavior. Normal judgment and thought content.   Assessment and Plan:  Pregnancy: [id number] at 8232w2d  1. H/O preterm delivery, currently pregnant 17 p weekly until 36 weeks  2. Gestational diabetes mellitus (GDM) in third trimester controlled on oral hypoglycemic drug FBS occas elevated, has had poor compliance with testing in the last week. Increase to 7.5 mg glyburide  BID  3. Cholelithiasis and cholecystitis affecting pregnancy in second trimester, antepartum   4. Supervision of high risk pregnancy in third trimester Headache, occipital, c/w TT HA, rx Fioricet  Preterm labor symptoms and general obstetric precautions including but not limited to vaginal bleeding, contractions, leaking of fluid and fetal movement were reviewed in detail with the patient. Please refer to After Visit Summary for other counseling recommendations.  Return in about 1 week (around 12/12/2016) for visit and NST.   Adam PhenixJames G Arnold, MD

## 2016-12-09 ENCOUNTER — Encounter (HOSPITAL_COMMUNITY): Payer: Self-pay

## 2016-12-09 ENCOUNTER — Ambulatory Visit (HOSPITAL_COMMUNITY)
Admission: RE | Admit: 2016-12-09 | Discharge: 2016-12-09 | Disposition: A | Discharge status: Home / Self Care | Payer: Medicaid Other | Source: Ambulatory Visit | Attending: Obstetrics & Gynecology | Admitting: Obstetrics & Gynecology

## 2016-12-09 ENCOUNTER — Other Ambulatory Visit (HOSPITAL_COMMUNITY): Payer: Self-pay | Admitting: Maternal and Fetal Medicine

## 2016-12-09 DIAGNOSIS — Z3A34 34 weeks gestation of pregnancy: Secondary | ICD-10-CM | POA: Insufficient documentation

## 2016-12-09 DIAGNOSIS — O24415 Gestational diabetes mellitus in pregnancy, controlled by oral hypoglycemic drugs: Secondary | ICD-10-CM | POA: Diagnosis not present

## 2016-12-09 DIAGNOSIS — Z3A33 33 weeks gestation of pregnancy: Secondary | ICD-10-CM

## 2016-12-09 DIAGNOSIS — O09293 Supervision of pregnancy with other poor reproductive or obstetric history, third trimester: Secondary | ICD-10-CM | POA: Insufficient documentation

## 2016-12-09 DIAGNOSIS — O09213 Supervision of pregnancy with history of pre-term labor, third trimester: Secondary | ICD-10-CM | POA: Diagnosis not present

## 2016-12-09 DIAGNOSIS — O99213 Obesity complicating pregnancy, third trimester: Secondary | ICD-10-CM | POA: Insufficient documentation

## 2016-12-12 ENCOUNTER — Ambulatory Visit (INDEPENDENT_AMBULATORY_CARE_PROVIDER_SITE_OTHER): Payer: Medicaid Other | Admitting: Obstetrics & Gynecology

## 2016-12-12 DIAGNOSIS — O24415 Gestational diabetes mellitus in pregnancy, controlled by oral hypoglycemic drugs: Secondary | ICD-10-CM | POA: Diagnosis not present

## 2016-12-12 DIAGNOSIS — O09213 Supervision of pregnancy with history of pre-term labor, third trimester: Secondary | ICD-10-CM

## 2016-12-12 NOTE — Progress Notes (Signed)
Patient complains of persistent headaches, Rx is not helping. Dry Coughing for 14 days, stratchy throat.    17P given in RUOQ. Tolerated well. Administrations This Visit    hydroxyprogesterone caproate (MAKENA) 250 mg/mL injection 250 mg    Admin Date 12/12/2016 Action Given Dose 250 mg Route Intramuscular Administered By Maretta Beesarol J McGlashan, RMA         Placed on NST machine

## 2016-12-12 NOTE — Progress Notes (Signed)
   PRENATAL VISIT NOTE  Subjective:  Connie White is a 21 y.o. [id number] at 3223w2d being seen today for ongoing prenatal care.  She is currently monitored for the following issues for this high-risk pregnancy and has Gestational diabetes mellitus (GDM) in third trimester controlled on oral hypoglycemic drug; Supervision of high risk pregnancy in third trimester; H/O preterm delivery, currently pregnant; Pregnancy related hip pain, antepartum; and Cholelithiasis and cholecystitis affecting pregnancy in second trimester, antepartum on her problem list.  Patient reports headache.  Contractions: Irritability. Vag. Bleeding: None.  Movement: Present. Denies leaking of fluid.   The following portions of the patient's history were reviewed and updated as appropriate: allergies, current medications, past family history, past medical history, past social history, past surgical history and problem list. Problem list updated.  Objective:   Vitals:   12/12/16 1325  BP: 120/78  Pulse: 96  Weight: 163 lb (73.9 kg)    Fetal Status:     Movement: Present     General:  Alert, oriented and cooperative. Patient is in no acute distress.  Skin: Skin is warm and dry. No rash noted.   Cardiovascular: Normal heart rate noted  Respiratory: Normal respiratory effort, no problems with respiration noted  Abdomen: Soft, gravid, appropriate for gestational age. Pain/Pressure: Present     Pelvic:  Cervical exam deferred        Extremities: Normal range of motion.  Edema: None  Mental Status: Normal mood and affect. Normal behavior. Normal judgment and thought content.   Assessment and Plan:  Pregnancy: [id number] at 7623w2d  1. Gestational diabetes mellitus (GDM) in third trimester controlled on oral hypoglycemic drug FBS normal and 2/3 PP in range and max 180 - Fetal nonstress test; Future Continue present glyburide Preterm labor symptoms and general obstetric precautions including but not limited to vaginal  bleeding, contractions, leaking of fluid and fetal movement were reviewed in detail with the patient. Please refer to After Visit Summary for other counseling recommendations.  Return in about 1 week (around 12/19/2016). NST reactive today  Adam PhenixJames G Lashayla Armes, MD

## 2016-12-12 NOTE — Patient Instructions (Signed)

## 2016-12-16 ENCOUNTER — Encounter (HOSPITAL_COMMUNITY): Payer: Self-pay

## 2016-12-16 ENCOUNTER — Ambulatory Visit (HOSPITAL_COMMUNITY)
Admission: RE | Admit: 2016-12-16 | Discharge: 2016-12-16 | Disposition: A | Discharge status: Home / Self Care | Payer: Medicaid Other | Source: Ambulatory Visit | Attending: Obstetrics & Gynecology | Admitting: Obstetrics & Gynecology

## 2016-12-16 DIAGNOSIS — O09299 Supervision of pregnancy with other poor reproductive or obstetric history, unspecified trimester: Secondary | ICD-10-CM | POA: Diagnosis not present

## 2016-12-16 DIAGNOSIS — O09219 Supervision of pregnancy with history of pre-term labor, unspecified trimester: Secondary | ICD-10-CM | POA: Insufficient documentation

## 2016-12-16 DIAGNOSIS — Z3A35 35 weeks gestation of pregnancy: Secondary | ICD-10-CM | POA: Diagnosis not present

## 2016-12-16 DIAGNOSIS — O99213 Obesity complicating pregnancy, third trimester: Secondary | ICD-10-CM | POA: Insufficient documentation

## 2016-12-16 DIAGNOSIS — O24415 Gestational diabetes mellitus in pregnancy, controlled by oral hypoglycemic drugs: Secondary | ICD-10-CM | POA: Insufficient documentation

## 2016-12-19 ENCOUNTER — Encounter: Payer: Self-pay | Admitting: Obstetrics & Gynecology

## 2016-12-19 ENCOUNTER — Encounter (HOSPITAL_COMMUNITY): Payer: Self-pay

## 2016-12-19 ENCOUNTER — Inpatient Hospital Stay (HOSPITAL_COMMUNITY)
Admission: AD | Admit: 2016-12-19 | Discharge: 2016-12-21 | DRG: 775 | Disposition: A | Discharge status: Home / Self Care | Payer: Medicaid Other | Source: Ambulatory Visit | Attending: Obstetrics & Gynecology | Admitting: Obstetrics & Gynecology

## 2016-12-19 DIAGNOSIS — Z3A36 36 weeks gestation of pregnancy: Secondary | ICD-10-CM

## 2016-12-19 DIAGNOSIS — O9962 Diseases of the digestive system complicating childbirth: Secondary | ICD-10-CM | POA: Diagnosis present

## 2016-12-19 DIAGNOSIS — Z833 Family history of diabetes mellitus: Secondary | ICD-10-CM

## 2016-12-19 DIAGNOSIS — O24425 Gestational diabetes mellitus in childbirth, controlled by oral hypoglycemic drugs: Secondary | ICD-10-CM | POA: Diagnosis present

## 2016-12-19 DIAGNOSIS — O42013 Preterm premature rupture of membranes, onset of labor within 24 hours of rupture, third trimester: Secondary | ICD-10-CM | POA: Diagnosis present

## 2016-12-19 DIAGNOSIS — O24429 Gestational diabetes mellitus in childbirth, unspecified control: Secondary | ICD-10-CM | POA: Diagnosis not present

## 2016-12-19 DIAGNOSIS — K8010 Calculus of gallbladder with chronic cholecystitis without obstruction: Secondary | ICD-10-CM | POA: Diagnosis present

## 2016-12-19 DIAGNOSIS — Z87891 Personal history of nicotine dependence: Secondary | ICD-10-CM | POA: Diagnosis not present

## 2016-12-19 DIAGNOSIS — O4290 Premature rupture of membranes, unspecified as to length of time between rupture and onset of labor, unspecified weeks of gestation: Secondary | ICD-10-CM | POA: Diagnosis present

## 2016-12-19 DIAGNOSIS — R748 Abnormal levels of other serum enzymes: Secondary | ICD-10-CM | POA: Diagnosis present

## 2016-12-19 LAB — COMPREHENSIVE METABOLIC PANEL
ALBUMIN: 2.9 g/dL — AB (ref 3.5–5.0)
ALK PHOS: 147 U/L — AB (ref 38–126)
ALT: 101 U/L — AB (ref 14–54)
ALT: 91 U/L — AB (ref 14–54)
AST: 57 U/L — ABNORMAL HIGH (ref 15–41)
AST: 52 U/L — AB (ref 15–41)
Albumin: 3 g/dL — ABNORMAL LOW (ref 3.5–5.0)
Alkaline Phosphatase: 125 U/L (ref 38–126)
Anion gap: 9 (ref 5–15)
Anion gap: 8 (ref 5–15)
BILIRUBIN TOTAL: 0.8 mg/dL (ref 0.3–1.2)
BUN: 13 mg/dL (ref 6–20)
BUN: 12 mg/dL (ref 6–20)
CALCIUM: 9.7 mg/dL (ref 8.9–10.3)
CHLORIDE: 107 mmol/L (ref 101–111)
CO2: 19 mmol/L — AB (ref 22–32)
CO2: 21 mmol/L — ABNORMAL LOW (ref 22–32)
CREATININE: 0.63 mg/dL (ref 0.44–1.00)
CREATININE: 0.68 mg/dL (ref 0.44–1.00)
Calcium: 9.2 mg/dL (ref 8.9–10.3)
Chloride: 106 mmol/L (ref 101–111)
GFR calc non Af Amer: >60 mL/min (ref >60)
GLUCOSE: 120 mg/dL — AB (ref 65–99)
Glucose, Bld: 65 mg/dL (ref 65–99)
POTASSIUM: 3.6 mmol/L (ref 3.5–5.1)
Potassium: 4 mmol/L (ref 3.5–5.1)
SODIUM: 134 mmol/L — AB (ref 135–145)
SODIUM: 136 mmol/L (ref 135–145)
Total Bilirubin: 0.9 mg/dL (ref 0.3–1.2)
Total Protein: 7.1 g/dL (ref 6.5–8.1)
Total Protein: 6.7 g/dL (ref 6.5–8.1)

## 2016-12-19 LAB — CBC
HCT: 32.4 % — ABNORMAL LOW (ref 36.0–46.0)
HEMATOCRIT: 34.6 % — AB (ref 36.0–46.0)
HEMOGLOBIN: 11 g/dL — AB (ref 12.0–15.0)
Hemoglobin: 10.3 g/dL — ABNORMAL LOW (ref 12.0–15.0)
MCH: 22.3 pg — AB (ref 26.0–34.0)
MCH: 22.7 pg — ABNORMAL LOW (ref 26.0–34.0)
MCHC: 31.8 g/dL (ref 30.0–36.0)
MCHC: 31.8 g/dL (ref 30.0–36.0)
MCV: 70 fL — ABNORMAL LOW (ref 78.0–100.0)
MCV: 71.4 fL — AB (ref 78.0–100.0)
PLATELETS: 304 10*3/uL (ref 150–400)
Platelets: 349 10*3/uL (ref 150–400)
RBC: 4.94 MIL/uL (ref 3.87–5.11)
RBC: 4.54 MIL/uL (ref 3.87–5.11)
RDW: 15.1 % (ref 11.5–15.5)
RDW: 14.7 % (ref 11.5–15.5)
WBC: 9.8 10*3/uL (ref 4.0–10.5)
WBC: 9.4 10*3/uL (ref 4.0–10.5)

## 2016-12-19 LAB — POCT FERN TEST: POCT FERN TEST: POSITIVE

## 2016-12-19 LAB — TYPE AND SCREEN
ABO/RH(D): B POS
Antibody Screen: NEGATIVE

## 2016-12-19 LAB — PROTEIN / CREATININE RATIO, URINE
Creatinine, Urine: 71 mg/dL
Protein Creatinine Ratio: 0.41 mg/mg{Cre} — ABNORMAL HIGH (ref 0.00–0.15)
TOTAL PROTEIN, URINE: 29 mg/dL

## 2016-12-19 LAB — GLUCOSE, CAPILLARY
GLUCOSE-CAPILLARY: 112 mg/dL — AB (ref 65–99)
Glucose-Capillary: 65 mg/dL (ref 65–99)

## 2016-12-19 LAB — GROUP B STREP BY PCR: Group B strep by PCR: POSITIVE — AB

## 2016-12-19 LAB — OB RESULTS CONSOLE GBS: STREP GROUP B AG: POSITIVE

## 2016-12-19 MED ORDER — OXYCODONE-ACETAMINOPHEN 5-325 MG PO TABS
1.0000 | ORAL_TABLET | Freq: Once | ORAL | Status: AC
  Administered 2016-12-19: 1 via ORAL
  Filled 2016-12-19: qty 1

## 2016-12-19 MED ORDER — OXYTOCIN BOLUS FROM INFUSION
500.0000 mL | Freq: Once | INTRAVENOUS | Status: AC
  Administered 2016-12-19: 500 mL via INTRAVENOUS

## 2016-12-19 MED ORDER — LIDOCAINE HCL (PF) 1 % IJ SOLN
30.0000 mL | INTRAMUSCULAR | Status: DC | PRN
  Filled 2016-12-19: qty 30

## 2016-12-19 MED ORDER — DIBUCAINE 1 % RE OINT: 1.0000 "application " | TOPICAL_OINTMENT | RECTAL | Status: DC | PRN

## 2016-12-19 MED ORDER — LACTATED RINGERS IV SOLN: 500.0000 mL | INTRAVENOUS | Status: DC | PRN

## 2016-12-19 MED ORDER — OXYCODONE-ACETAMINOPHEN 5-325 MG PO TABS: 1.0000 | ORAL_TABLET | ORAL | Status: DC | PRN

## 2016-12-19 MED ORDER — ONDANSETRON HCL 4 MG/2ML IJ SOLN: 4.0000 mg | Freq: Four times a day (QID) | INTRAMUSCULAR | Status: DC | PRN

## 2016-12-19 MED ORDER — OXYCODONE-ACETAMINOPHEN 5-325 MG PO TABS: 2.0000 | ORAL_TABLET | ORAL | Status: DC | PRN

## 2016-12-19 MED ORDER — LACTATED RINGERS IV SOLN
INTRAVENOUS | Status: DC
  Administered 2016-12-19 (×2): via INTRAVENOUS

## 2016-12-19 MED ORDER — DIPHENHYDRAMINE HCL 25 MG PO CAPS: 25.0000 mg | ORAL_CAPSULE | Freq: Four times a day (QID) | ORAL | Status: DC | PRN

## 2016-12-19 MED ORDER — OXYTOCIN 40 UNITS IN LACTATED RINGERS INFUSION - SIMPLE MED: 2.5000 [IU]/h | INTRAVENOUS | Status: DC

## 2016-12-19 MED ORDER — ACETAMINOPHEN 325 MG PO TABS: 650.0000 mg | ORAL_TABLET | ORAL | Status: DC | PRN

## 2016-12-19 MED ORDER — ONDANSETRON HCL 4 MG/2ML IJ SOLN: 4.0000 mg | INTRAMUSCULAR | Status: DC | PRN

## 2016-12-19 MED ORDER — BENZOCAINE-MENTHOL 20-0.5 % EX AERO
1.0000 | INHALATION_SPRAY | CUTANEOUS | Status: DC | PRN
Start: 2016-12-19 — End: 2016-12-21
  Administered 2016-12-19: 1 via TOPICAL
  Filled 2016-12-19: qty 56

## 2016-12-19 MED ORDER — IBUPROFEN 600 MG PO TABS
600.0000 mg | ORAL_TABLET | Freq: Four times a day (QID) | ORAL | Status: DC
  Administered 2016-12-19 – 2016-12-21 (×6): 600 mg via ORAL
  Filled 2016-12-19 (×10): qty 1

## 2016-12-19 MED ORDER — SOD CITRATE-CITRIC ACID 500-334 MG/5ML PO SOLN: 30.0000 mL | ORAL | Status: DC | PRN

## 2016-12-19 MED ORDER — PRENATAL MULTIVITAMIN CH
1.0000 | ORAL_TABLET | Freq: Every day | ORAL | Status: DC
  Administered 2016-12-20: 1 via ORAL
  Filled 2016-12-19 (×3): qty 1

## 2016-12-19 MED ORDER — FLEET ENEMA 7-19 GM/118ML RE ENEM: 1.0000 | ENEMA | RECTAL | Status: DC | PRN

## 2016-12-19 MED ORDER — FENTANYL CITRATE (PF) 100 MCG/2ML IJ SOLN
100.0000 ug | INTRAMUSCULAR | Status: DC | PRN
  Administered 2016-12-19: 100 ug via INTRAVENOUS
  Filled 2016-12-19: qty 2

## 2016-12-19 MED ORDER — LIDOCAINE HCL (PF) 1 % IJ SOLN: 30.0000 mL | INTRAMUSCULAR | Status: DC | PRN

## 2016-12-19 MED ORDER — OXYTOCIN BOLUS FROM INFUSION: 500.0000 mL | Freq: Once | INTRAVENOUS | Status: DC

## 2016-12-19 MED ORDER — OXYTOCIN 40 UNITS IN LACTATED RINGERS INFUSION - SIMPLE MED
2.5000 [IU]/h | INTRAVENOUS | Status: DC
  Administered 2016-12-19: 2.5 [IU]/h via INTRAVENOUS

## 2016-12-19 MED ORDER — ONDANSETRON HCL 4 MG PO TABS: 4.0000 mg | ORAL_TABLET | ORAL | Status: DC | PRN

## 2016-12-19 MED ORDER — ACETAMINOPHEN 325 MG PO TABS
650.0000 mg | ORAL_TABLET | ORAL | Status: DC | PRN
Start: 2016-12-19 — End: 2016-12-19
  Administered 2016-12-19: 650 mg via ORAL
  Filled 2016-12-19: qty 2

## 2016-12-19 MED ORDER — TERBUTALINE SULFATE 1 MG/ML IJ SOLN
0.2500 mg | Freq: Once | INTRAMUSCULAR | Status: DC | PRN
  Filled 2016-12-19: qty 1

## 2016-12-19 MED ORDER — MISOPROSTOL 50MCG HALF TABLET
50.0000 ug | ORAL_TABLET | ORAL | Status: DC
  Filled 2016-12-19: qty 1

## 2016-12-19 MED ORDER — SIMETHICONE 80 MG PO CHEW: 80.0000 mg | CHEWABLE_TABLET | ORAL | Status: DC | PRN

## 2016-12-19 MED ORDER — LACTATED RINGERS IV SOLN
INTRAVENOUS | Status: DC
Start: 2016-12-19 — End: 2016-12-19

## 2016-12-19 MED ORDER — WITCH HAZEL-GLYCERIN EX PADS: 1.0000 "application " | MEDICATED_PAD | CUTANEOUS | Status: DC | PRN

## 2016-12-19 MED ORDER — COCONUT OIL OIL: 1.0000 "application " | TOPICAL_OIL | Status: DC | PRN

## 2016-12-19 MED ORDER — PENICILLIN G POT IN DEXTROSE 60000 UNIT/ML IV SOLN
3.0000 10*6.[IU] | INTRAVENOUS | Status: DC
  Administered 2016-12-19: 3 10*6.[IU] via INTRAVENOUS
  Filled 2016-12-19 (×6): qty 50

## 2016-12-19 MED ORDER — ZOLPIDEM TARTRATE 5 MG PO TABS: 5.0000 mg | ORAL_TABLET | Freq: Every evening | ORAL | Status: DC | PRN

## 2016-12-19 MED ORDER — PENICILLIN G POTASSIUM 5000000 UNITS IJ SOLR
5.0000 10*6.[IU] | Freq: Once | INTRAVENOUS | Status: AC
  Administered 2016-12-19: 5 10*6.[IU] via INTRAVENOUS
  Filled 2016-12-19: qty 5

## 2016-12-19 MED ORDER — SENNOSIDES-DOCUSATE SODIUM 8.6-50 MG PO TABS
2.0000 | ORAL_TABLET | ORAL | Status: DC
  Administered 2016-12-19 – 2016-12-21 (×2): 2 via ORAL
  Filled 2016-12-19 (×2): qty 2

## 2016-12-19 MED ORDER — OXYTOCIN 40 UNITS IN LACTATED RINGERS INFUSION - SIMPLE MED
1.0000 m[IU]/min | INTRAVENOUS | Status: DC
  Administered 2016-12-19: 2 m[IU]/min via INTRAVENOUS
  Filled 2016-12-19: qty 1000

## 2016-12-19 MED ORDER — TETANUS-DIPHTH-ACELL PERTUSSIS 5-2.5-18.5 LF-MCG/0.5 IM SUSP: 0.5000 mL | Freq: Once | INTRAMUSCULAR | Status: DC

## 2016-12-19 NOTE — H&P (Signed)
LABOR ADMISSION HISTORY AND PHYSICAL  Connie White is a 21 y.o. female [phone] with IUP at [redacted]w[redacted]d by [redacted]w[redacted]d [place] presenting for PPROM with clear water at 2330 with clear fluid. She reports irregular contraction every 7 minutes. Fetal movement present and at baseline. She denies vaginal bleeding. Reports mild headache that she rates as 3/10. She reports history of headache at baseline. She denies blurry vision now although she admits seeing floaters yesterday. She denies RUQ pain. She is undecided about feeding and birth control and pediatrician.  Dating: By [redacted]w[redacted]d --->  Estimated Date of Delivery: 01/14/17  Sono:    , CWD, normal anatomy, cephalic presentation, long lie, 3658g, >90% EFW   Prenatal History/Complications:  Past Medical History: Past Medical History:  Diagnosis Date  . Gestational diabetes     Past Surgical History: Past Surgical History:  Procedure Laterality Date  . CHOLECYSTECTOMY N/A 10/22/2016   Procedure: LAPAROSCOPIC CHOLECYSTECTOMY;  Surgeon: Claud Kelp, MD;  Location: [facility];  Service: General;  Laterality: N/A;    Obstetrical History: OB History    Gravida Para Term Preterm AB Living   3 1 0 SAB TAB Ectopic Multiple Live Births   1 0 0 0 1      Social History: Social History   Social History  . Marital status: Single    Spouse name: N/A  . Number of children: N/A  . Years of education: N/A   Social History Main Topics  . Smoking status: Former Smoker    Quit date: 05/17/2016  . Smokeless tobacco: Never Used  . Alcohol use No  . Drug use: No  . Sexual activity: Not Currently    Partners: Male    Birth control/ protection: None   Other Topics Concern  . None   Social History Narrative  . None    Family History: Family History  Problem Relation Age of Onset  . Diabetes Mother     Allergies: No Known Allergies  Facility-Administered Medications Prior to Admission  Medication Dose Route Frequency Provider Last Rate Last  Dose  . hydroxyprogesterone caproate (MAKENA) 250 mg/mL injection 250 mg  250 mg Intramuscular Weekly Adam Phenix, MD   250 mg at 12/12/16 1317  . hydroxyprogesterone caproate (MAKENA) 250 mg/mL injection 250 mg  250 mg Intramuscular Once Tereso Newcomer, MD       Prescriptions Prior to Admission  Medication Sig Dispense Refill Last Dose  . acetaminophen (TYLENOL) 500 MG tablet Take 1,000 mg by mouth every 6 (six) hours as needed for mild pain or headache.   12/18/2016 at Unknown time  . glyBURIDE (DIABETA) 5 MG tablet Take 1.5 tablets (7.5 mg total) by mouth 2 (two) times daily with a meal. 90 tablet 2 12/18/2016 at U1830  . metFORMIN (GLUCOPHAGE) 500 MG tablet Take 1 tablet (500 mg total) by mouth 2 (two) times daily with a meal. 60 tablet 5 12/18/2016 at 1230  . pantoprazole (PROTONIX) 40 MG tablet Take 1 tablet (40 mg total) by mouth daily. 30 tablet 5 12/18/2016 at U1100nknown time  . Prenatal Vit-Fe Phos-FA-Omega (VITAFOL GUMMIES) 3.33-0.333-34.8 MG CHEW Chew 3 tablets by mouth daily before breakfast. 90 tablet 11 12/18/2016 at 1000  . ACCU-CHEK FASTCLIX LANCETS MISC TEST four times a day 102 each 12 Taking  . ACCU-CHEK SMARTVIEW test strip TEST four times a day FASTING AND 2 HOURS AFTER EACH MEAL 100 each 12 Taking  . butalbital-acetaminophen-caffeine (FIORICET, ESGIC) 50-325-40 MG tablet Take 1-2  tablets by mouth every 6 (six) hours as needed for headache. (Patient not taking: Reported on 12/16/2016) 20 tablet 0 Unknown at Unknown time  . cyclobenzaprine (FLEXERIL) 10 MG tablet Take 1 tablet (10 mg total) by mouth every 8 (eight) hours as needed for muscle spasms. (Patient not taking: Reported on 12/09/2016) 30 tablet 1 Unknown at Unknown time  . docusate sodium (COLACE) 100 MG capsule Take 1 capsule (100 mg total) by mouth 2 (two) times daily as needed. (Patient not taking: Reported on 12/16/2016) 30 capsule 2 Unknown at Unknown time  . HYDROcodone-acetaminophen (NORCO/VICODIN) 5-325 MG tablet Take 2  tablets by mouth every 6 (six) hours as needed for moderate pain or severe pain. (Patient not taking: Reported on 11/21/2016) 40 tablet 0 More than a month at Unknown time  . ondansetron (ZOFRAN ODT) 4 MG disintegrating tablet Take 1 tablet (4 mg total) by mouth every 8 (eight) hours as needed for nausea or vomiting. (Patient not taking: Reported on 12/16/2016) 20 tablet 1 Unknown at Unknown time     Review of Systems A twelve point review of system negative except for HPI  Blood pressure (!) 141/88, pulse 75, temperature 98.1 F (36.7 C), temperature source Oral, resp. rate 16, height  (1.397 m), weight 163 lb (73.9 kg), last menstrual period 03/30/2016, SpO2 98 %. GEN: appearance: alert, cooperative and appears stated age RESP: clear to auscultation bilaterally, no increased WOB CVS:: regular rate and rhythm, no murmurs, no sign of DVT, +2 DP GI: soft, non-tender; bowel sounds normal MSK: WWP, Homans sign is negative,  NEURO: no gross deficits PSYCH:  Pelvic Exam: Cervical exam: Dilation: 1.5 Effacement (%): 50 Station: -2 Exam by:: L.Stubbs, RN Presentation: cephalic Uterine activityDate/time of onset: 12/19/2016@2359 , Frequency: Every 7 minutes, Duration: 10 seconds and Intensity: mild  Fetal monitoringBaseline: 140 bpm, Variability: Good {> 6 bpm), Accelerations: Reactive and Decelerations: Absent  Prenatal labs: ABO, Rh: --/--/B POS (04/05 0055) Antibody: NEG (04/05 0055) Rubella: !Error! RPR: Non Reactive (09/27 1438)  HBsAg: Negative (09/27 1438)  HIV: Non Reactive (09/06 0126)  GBS:    1 hr Glucola 140 Genetic screening  negative Anatomy US normal  Prenatal Transfer Tool  Maternal Diabetes: A2GDM Genetic Screening: Normal Maternal Ultrasounds/Referrals: Normal except LGA Fetal Ultrasounds or other Referrals:  Referred to Materal Fetal Medicine  Maternal Substance Abuse:  No Significant Maternal Medications:  Meds include: Other: metformin Significant Maternal  Lab Results: None  Results for orders placed or performed during the hospital encounter of 12/19/16 (from the past 24 hour(s))  POCT fern test   Collection Time: 12/19/16 12:39 AM  Result Value Ref Range   POCT Fern Test Positive = ruptured amniotic membanes   Comprehensive metabolic panel   Collection Time: 12/19/16 12:55 AM  Result Value Ref Range   Sodium 134 (L) 135 - 145 mmol/L   Potassium 4.0 3.5 - 5.1 mmol/L   Chloride 106 101 - 111 mmol/L   CO2 19 (L) 22 - 32 mmol/L   Glucose, Bld 120 (H) 65 - 99 mg/dL   BUN 13 6 - 20 mg/dL   Creatinine, Ser 9.60 0.44 - 1.00 mg/dL   Calcium 9.7 8.9 - 45.4 mg/dL   Total Protein 7.1 6.5 - 8.1 g/dL   Albumin 3.0 (L) 3.5 - 5.0 g/dL   AST 57 (H) 15 - 41 U/L   ALT 101 (H) 14 - 54 U/L   Alkaline Phosphatase 147 (H) 38 - 126 U/L   Total Bilirubin 0.8 0.3 -  1.2 mg/dL   GFR calc non Af Amer >60 >60 mL/min   GFR calc Af Amer >60 >60 mL/min   Anion gap 9 5 - 15  CBC   Collection Time: 12/19/16 12:55 AM  Result Value Ref Range   WBC 9.8 4.0 - 10.5 K/uL   RBC 4.94 3.87 - 5.11 MIL/uL   Hemoglobin 11.0 (L) 12.0 - 15.0 g/dL   HCT 16.1 (L) 09.6 - 04.5 %   MCV 70.0 (L) 78.0 - 100.0 fL   MCH 22.3 (L) 26.0 - 34.0 pg   MCHC 31.8 30.0 - 36.0 g/dL   RDW 40.9 81.1 - 91.4 %   Platelets 349 150 - 400 K/uL  Type and screen [facility] [place] [facility]   Collection Time: 12/19/16 12:55 AM  Result Value Ref Range   ABO/RH(D) B POS    Antibody Screen NEG    Sample Expiration 12/22/2016   Protein / creatinine ratio, urine   Collection Time: 12/19/16  1:13 AM  Result Value Ref Range   Creatinine, Urine 71.00 mg/dL   Total Protein, Urine 29 mg/dL   Protein Creatinine Ratio 0.41 (H) 0.00 - 0.15 mg/mg[Cre]    Patient Active Problem List   Diagnosis Date Noted  . Normal labor 12/19/2016  . PROM (premature rupture of membranes) 12/19/2016  . Cholelithiasis and cholecystitis affecting pregnancy in second trimester, antepartum 10/18/2016  . Pregnancy  related hip pain, antepartum 10/16/2016  . Supervision of high risk pregnancy in third trimester 07/10/2016  . H/O preterm delivery, currently pregnant 07/10/2016  . Gestational diabetes mellitus (GDM) in third trimester controlled on oral hypoglycemic drug 03/10/2014    Assessment: Connie Y Curto is a 21 y.o. [id number] at [redacted]w[redacted]d here for PPROM at 2330.   One elevated BP with mild HA. UPC 0.41 but after PROM. Also elevated ALT to 101 and AST to 57.  #Elevated liver enzyme: repeat CMP and CBC in 6 hrs #A2GDM: CBG q4h #Labor/PPROM: FB and buccal Cytotec followed by pit.  #Pain: as needed and epidural up on request #FWB:  CAT-1 #ID: GBS PCR pending. PCN #MOF: undecided #MOC: undecided #Circ:  n/a  Taye T Gonfa 12/19/2016, 4:20 AM   I confirm that I have verified the information documented in the resident's note and that I have also personally performed the physical exam and all medical decision making activities.   Luna Kitchens CNM

## 2016-12-19 NOTE — MAU Note (Signed)
Pt states water broke at 1140pm-clear fluid. Pt states she has some irregular contraction. Rates 6/10. +FM. Denies vag bleeding.

## 2016-12-19 NOTE — Progress Notes (Signed)
Labor Progress Note H'maneca LESHAE MCCLAY is a 21 y.o. [id number] at [redacted]w[redacted]d presented for PPROM S: reports more contraction. Headache 6/10. No vision changes. Improved with tylenol.   O:  BP 134/84   Pulse 76   Temp 98.1 F (36.7 C) (Oral)   Resp 16   Ht  (1.397 m)   Wt 163 lb (73.9 kg)   LMP 03/30/2016 (Approximate)   SpO2 98%   BMI 37.88 kg/m  EFM: 140/mod var/no decels  CVE: Dilation: 3 Effacement (%): 70 Cervical Position: Middle Station: -2 Presentation: Vertex Exam by:: Dr.Dusti Tetro   A&P: 21 y.o. [id number] [redacted]w[redacted]d here with PPROM.   #Elevated liver enzyme: repeat CMP and CBC in 6 hrs #A2GDM: CBG q4h #Labor/PPROM: augment with FB and buccal Cytotec. #Pain: as needed and epidural up on request #FWB:  CAT-1 #ID: GBS PCR positive. PCN #MOF: undecided #MOC: undecided #Circ: n/a  Almon Hercules, MD 5:50 AM

## 2016-12-19 NOTE — Lactation Note (Addendum)
This note was copied from a baby's chart. Lactation Consultation Note  Patient Name: Connie White Today's Date: 12/19/2016 Reason for consult: Initial assessment   Initial assessment with mom of 1 hour old infant LPT infant in [employer]. Infant under warmer getting vital signs and weights, infant was awake and alert and was suckling on gloved finger. Infant noted to be jittery when [facility] entered room, RN reported infant is to get blood glucose at 2 hour of age.  RN and mom report infant has not fed, mom reports she does not know how to hand express.  Mom being gotten up to bathroom to void. Called Roylene Reason, RN in [place] and requested a blood glucose. Resident MD showed up a few minutes later to assess infant. Hand expressed mom as soon as she came out of the bathroom and spoon fed infant 1 cc colostrum, she tolerated it well. Followed mom and infant to PPU to continue care and latch infant to breast.    Maternal Data Formula Feeding for Exclusion: No Reason for exclusion: Mother's choice to formula and breast feed on admission Has patient been taught Hand Expression?: Yes Does the patient have breastfeeding experience prior to this delivery?: Yes  Feeding    LATCH Score/Interventions                      Lactation Tools Discussed/Used WIC Program: Yes   Consult Status Consult Status: Follow-up Date: 12/19/16 Follow-up type: In-patient    Silas Flood Levoy Geisen 12/19/2016, 12:37 PM

## 2016-12-19 NOTE — Progress Notes (Signed)
Bedside US: [name]  1:17 AM 12/19/16

## 2016-12-19 NOTE — Lactation Note (Signed)
This note was copied from a baby's chart. Lactation Consultation Note  Patient Name: Connie White Today's Date: 12/19/2016 Reason for consult: Follow-up assessment;Late preterm infant Lab in to draw blood, baby fussy. Assisted Mom to latch baby since awake. After several attempts and using breast compression, baby was able to latch and BF for 5 minutes then fell asleep. Mom reports she wants to give baby colostrum but plans to pump/bottle feed. DEBP set up for Mom and LC encouraged Mom to pump every 3 hours for 15 minutes followed by 5 minutes of hand expression. Advised to supplement baby each feeding at least every 3 hours. Baby's blood sugar initially was 24, then 43 and lab drawing blood again for repeat sugar niow. Reviewed LPT guidelines with Mom for supplementing. LC left phone number for Mom to call if she would like assist with latch.   Maternal Data    Feeding Feeding Type: Formula Length of feed: 5 min  LATCH Score/Interventions Latch: Repeated attempts needed to sustain latch, nipple held in mouth throughout feeding, stimulation needed to elicit sucking reflex. Intervention(s): Adjust position;Assist with latch;Breast massage;Breast compression  Audible Swallowing: None  Type of Nipple: Flat (soft/compressible) Intervention(s): Double electric pump  Comfort (Breast/Nipple): Soft / non-tender     Hold (Positioning): Assistance needed to correctly position infant at breast and maintain latch. Intervention(s): Breastfeeding basics reviewed;Support Pillows;Position options;Skin to skin  LATCH Score: 5  Lactation Tools Discussed/Used Tools: Pump Breast pump type: Double-Electric Breast Pump   Consult Status Consult Status: Follow-up Date: 12/20/16 Follow-up type: In-patient    Alfred Levins 12/19/2016, 5:52 PM

## 2016-12-19 NOTE — Lactation Note (Signed)
This note was copied from a baby's chart. Lactation Consultation Note  Patient Name: Connie White Today's Date: 12/19/2016 Reason for consult: Follow-up assessment   Infant being held by FOB, CBG was drawn in the last few minutes. Infant then placed STS with mom and latched to left breast in the cross cradle hold. MD in to assess infant and she was spoon fed a few gtts colostrum and then relatched to breast. Infant fed on and off for 15 minutes. Mom was massaging/compressing breast with feeding. A few swallows were noted with feeding. Infant is noted to still be jittery, MD is aware, CBG pending.  Mom is planning to breast and bottle feed. She really wants infant to give colostrum but reports she is planning to mostly bottle feed. Discussed supply and demand, colostrum and milk coming to volume. Enc mom to BF STS 8-12 x in 24 hours at first feeding cues, Enc mom to BF prior to offering formula. Mom is interested in pumping, set up DEBP with instructions for use on Initiate setting, assembling, disassembling and cleaning of pump parts. Enc mom to pump every 2-3 hours post BF for 15 minutes.   LPT infant policy given and explained to parents. Enc mom to feed infant at least every 3 hours, keep infant hat on, keep infant STS as much as possible, and to call for assitance if unable to get infant to feed. Discussed that depending on CBG would determine next step for feeding infant.   Report to Cain Saupe, RN on feeding and pump set up. Report to Roylene Reason, RN on feeding, who reported CBG is 24 and will follow up with parents and infant. L&D RN Efraim Kaufmann reports infant was shoulder distocia and was initially under the warmer under the care of NICU team, STS was delayed and she was not showing feeding cues once placed STS with mom.      Maternal Data Formula Feeding for Exclusion: Yes Reason for exclusion: Mother's choice to formula and breast feed on admission Has patient been taught Hand  Expression?: Yes Does the patient have breastfeeding experience prior to this delivery?: Yes  Feeding Feeding Type: Breast Fed Length of feed: 15 min  LATCH Score/Interventions Latch: Repeated attempts needed to sustain latch, nipple held in mouth throughout feeding, stimulation needed to elicit sucking reflex. Intervention(s): Adjust position;Assist with latch;Breast massage;Breast compression  Audible Swallowing: A few with stimulation Intervention(s): Alternate breast massage;Hand expression;Skin to skin  Type of Nipple: Everted at rest and after stimulation  Comfort (Breast/Nipple): Soft / non-tender     Hold (Positioning): Assistance needed to correctly position infant at breast and maintain latch. Intervention(s): Breastfeeding basics reviewed;Support Pillows;Position options;Skin to skin  LATCH Score: 7  Lactation Tools Discussed/Used WIC Program: Yes Pump Review: Setup, frequency, and cleaning;Milk Storage Initiated by:: Noralee Stain, RN, IBCLC Date initiated:: 12/19/16   Consult Status Consult Status: Follow-up Date: 12/20/16 Follow-up type: In-patient    Silas Flood Artia Singley 12/19/2016, 12:42 PM

## 2016-12-19 NOTE — Anesthesia Pain Management Evaluation Note (Signed)
  CRNA Pain Management Visit Note  Patient: Connie White, 21 y.o., female  "Hello I am a member of the anesthesia team at [facility]. We have an anesthesia team available at all times to provide care throughout the hospital, including epidural management and anesthesia for C-section. I don't know your plan for the delivery whether it a natural birth, water birth, IV sedation, nitrous supplementation, doula or epidural, but we want to meet your pain goals."   1.Was your pain managed to your expectations on prior hospitalizations?   No   2.What is your expectation for pain management during this hospitalization?     IV pain meds  3.How can we help you reach that goal? Wants to try IV pain meds. Explained epidural and told her she still has the option of epidural at any time.  Record the patient's initial score and the patient's pain goal.   Pain: 7  Pain Goal: 10 [facility] wants you to be able to say your pain was always managed very well.  Naythan Douthit 12/19/2016

## 2016-12-20 LAB — HEPATITIS PANEL, ACUTE
HCV Ab: <0.1 s/co ratio (ref 0.0–0.9)
HEP B C IGM: NEGATIVE
HEP B S AG: NEGATIVE
Hep A IgM: NEGATIVE

## 2016-12-20 LAB — RPR: RPR: NONREACTIVE

## 2016-12-20 NOTE — Lactation Note (Signed)
This note was copied from a baby's chart. Lactation Consultation Note  Patient Name: Girl H'maneca Kim Today's Date: 12/20/2016   NICU baby 45 hours old. Mom reports that she pumped a little yesterday, but has decided that she just wants the baby to receive formula. Enc mom to call for assistance as needed.   Maternal Data    Feeding    LATCH Score/Interventions                      Lactation Tools Discussed/Used     Consult Status      Sherlyn Hay 12/20/2016, 9:39 AM

## 2016-12-20 NOTE — Progress Notes (Signed)
Patient ID: Connie White, female   DOB: Jul 31, 1996, 21 y.o.   MRN: [id number]  POSTPARTUM PROGRESS NOTE  Post Partum Day #1 Subjective:  Connie White is a 21 y.o. [id number] [redacted]w[redacted]d s/p SVD with shoulder dystocia.  No acute events overnight.  Pt denies problems with ambulating, voiding or po intake.  She denies nausea or vomiting.  Pain is well controlled.  She has had flatus. She has not had bowel movement.  Lochia Minimal.   Objective: Blood pressure 126/67, pulse 71, temperature 98.3 F (36.8 C), temperature source Oral, resp. rate 18, height  (1.397 m), weight 163 lb (73.9 kg), last menstrual period 03/30/2016, SpO2 100 %, unknown if currently breastfeeding.  Physical Exam:  General: alert, cooperative and no distress Lochia:normal flow Chest: CTAB Heart: RRR no m/r/g Abdomen: +BS, soft, nontender, nondistended Uterine Fundus: firm, below umbilicus DVT Evaluation: No calf swelling or tenderness Extremities: No edema   Recent Labs  12/19/16 0055 12/19/16 0548  HGB 11.0* 10.3*  HCT 34.6* 32.4*    Assessment/Plan:  ASSESSMENT: Connie White is a 21 y.o. [id number] [redacted]w[redacted]d s/p SVD with shoulder dystocia  Plan for discharge tomorrow and Contraception IUD  Bottle feeding Baby in NICU for blood sugar control   LOS: 1 day   Jen Mow, DO OB Fellow 12/20/2016, 9:46 AM

## 2016-12-21 LAB — COMPREHENSIVE METABOLIC PANEL
ALBUMIN: 2.9 g/dL — AB (ref 3.5–5.0)
ALT: 71 U/L — ABNORMAL HIGH (ref 14–54)
AST: 35 U/L (ref 15–41)
Alkaline Phosphatase: 118 U/L (ref 38–126)
Anion gap: 8 (ref 5–15)
BILIRUBIN TOTAL: 0.5 mg/dL (ref 0.3–1.2)
BUN: 15 mg/dL (ref 6–20)
CALCIUM: 9.1 mg/dL (ref 8.9–10.3)
CO2: 23 mmol/L (ref 22–32)
CREATININE: 0.9 mg/dL (ref 0.44–1.00)
Chloride: 104 mmol/L (ref 101–111)
GFR calc Af Amer: >60 mL/min (ref >60)
GFR calc non Af Amer: >60 mL/min (ref >60)
GLUCOSE: 179 mg/dL — AB (ref 65–99)
Potassium: 3.7 mmol/L (ref 3.5–5.1)
Sodium: 135 mmol/L (ref 135–145)
TOTAL PROTEIN: 6.4 g/dL — AB (ref 6.5–8.1)

## 2016-12-21 MED ORDER — ACETAMINOPHEN 325 MG PO TABS: 650.0000 mg | ORAL_TABLET | ORAL | 0 refills | Status: DC | PRN

## 2016-12-21 MED ORDER — IBUPROFEN 600 MG PO TABS: 600.0000 mg | ORAL_TABLET | Freq: Four times a day (QID) | ORAL | 0 refills | Status: DC

## 2016-12-21 NOTE — Discharge Instructions (Signed)

## 2016-12-21 NOTE — Progress Notes (Signed)
NICU admission:  CSW is aware of baby and reviewed chart. No current psycho-social needs noted throughout baby or MOB's Chart. Will be available for support, services and resources for MOB and family during NICU admission if appropriate.    Damiana Berrian, MSW, LCSW-A Clinical Social Worker   [facility]  Office: [phone]    

## 2016-12-21 NOTE — Discharge Summary (Signed)
OB Discharge Summary     Patient Name: Connie White DOB: 10-25-1995 MRN: [id number]  Date of admission: 12/19/2016 Delivering MD: Aviva Signs   Date of discharge: 12/21/2016  Admitting diagnosis: 36 WEEKS ROM Intrauterine pregnancy: [redacted]w[redacted]d     Secondary diagnosis:  Active Problems:   Normal labor   PROM (premature rupture of membranes)   Shoulder dystocia during labor and delivery, delivered  Additional problems: transamitis,      Discharge diagnosis: Preterm Pregnancy Delivered, GDM A2 and transamnitis                                                                                                Post partum procedures:none  Augmentation: Pitocin  Complications: Shoulder dystocia  Hospital course:  Induction of Labor With Vaginal Delivery   21 y.o. yo [id number] at [redacted]w[redacted]d was admitted to the hospital 12/19/2016 for induction of labor.  Indication for induction: PROM.  Patient had an labor course complicated by 1 minute shoulder dystocia: Membrane Rupture Time/Date: 11:40 PM ,12/18/2016   Intrapartum Procedures: Episiotomy: None [1]                                         Lacerations:  None [1]  Patient had delivery of a Viable infant.  Information for the patient's newborn:  Connie, White Girl Connie [[id number]]      12/19/2016  Details of delivery can be found in separate delivery note.  Patient had a routine postpartum course. Patient is discharged home 12/21/16.  Physical exam  Vitals:   12/19/16 2300 12/20/16 0624 12/20/16 2148 12/21/16 0500  BP:  126/67 128/78 116/69  Pulse: 92 71 79 80  Resp: Temp: 98.1 F (36.7 C) 98.3 F (36.8 C) 98.4 F (36.9 C) 98.5 F (36.9 C)  TempSrc:  Oral Oral Oral  SpO2: 100%   99%  Weight:      Height:       General: alert, cooperative and no distress Lochia: appropriate Uterine Fundus: firm Incision: N/A DVT Evaluation: No evidence of DVT seen on physical exam. No cords or calf tenderness. Labs: Lab Results  Component  Value Date   WBC 9.4 12/19/2016   HGB 10.3 (L) 12/19/2016   HCT 32.4 (L) 12/19/2016   MCV 71.4 (L) 12/19/2016   PLT 304 12/19/2016   CMP Latest Ref Rng & Units 12/21/2016  Glucose 65 - 99 mg/dL 696(E)  BUN 6 - 20 mg/dL 15  Creatinine 9.52 - 8.41 mg/dL 3.24  Sodium 401 - 027 mmol/L 135  Potassium 3.5 - 5.1 mmol/L 3.7  Chloride 101 - 111 mmol/L 104  CO2 22 - 32 mmol/L 23  Calcium 8.9 - 10.3 mg/dL 9.1  Total Protein 6.5 - 8.1 g/dL 6.4(L)  Total Bilirubin 0.3 - 1.2 mg/dL 0.5  Alkaline Phos 38 - 126 U/L 118  AST 15 - 41 U/L 35  ALT 14 - 54 U/L 71(H)    Discharge iDANELLY HASSINGER per After Visit Summary and "Baby  and Me Booklet".  After visit meds:  Allergies as of 12/21/2016   No Known Allergies     Medication List    STOP taking these medications   ACCU-CHEK FASTCLIX LANCETS Misc   ACCU-CHEK SMARTVIEW test strip Generic drug:  glucose blood   butalbital-acetaminophen-caffeine 50-325-40 MG tablet Commonly known as:  FIORICET, ESGIC   cyclobenzaprine 10 MG tablet Commonly known as:  FLEXERIL   docusate sodium 100 MG capsule Commonly known as:  COLACE   glyBURIDE 5 MG tablet Commonly known as:  DIABETA   HYDROcodone-acetaminophen 5-325 MG tablet Commonly known as:  NORCO/VICODIN   metFORMIN 500 MG tablet Commonly known as:  GLUCOPHAGE   ondansetron 4 MG disintegrating tablet Commonly known as:  ZOFRAN ODT     TAKE these medications   acetaminophen 325 MG tablet Commonly known as:  TYLENOL Take 2 tablets (650 mg total) by mouth every 4 (four) hours as needed (for pain scale < 4). What changed:  medication strength  how much to take  when to take this  reasons to take this   ibuprofen 600 MG tablet Commonly known as:  ADVIL,MOTRIN Take 1 tablet (600 mg total) by mouth every 6 (six) hours.   pantoprazole 40 MG tablet Commonly known as:  PROTONIX Take 1 tablet (40 mg total) by mouth daily.   VITAFOL GUMMIES 3.33-0.333-34.8 MG Chew Chew 3 tablets by  mouth daily before breakfast.       Diet: routine diet  Activity: Advance as tolerated. Pelvic rest for 6 weeks.   Outpatient follow up:6 weeks Follow up Appt:No future appointments. Follow up Visit:No Follow-up on file.  Postpartum contraception: Undecided  Newborn Data: Live born female  Birth Weight: 7 lb 4.6 oz (3306 g) APGAR: 3, 8  Baby Feeding: Bottle Disposition:NICU   12/21/2016 Connie Penna, MD

## 2016-12-21 NOTE — Lactation Note (Signed)
This note was copied from a baby's chart. Lactation Consultation Note  Patient Name: Girl H'maneca Stickels Today's Date: 12/21/2016 Reason for consult: Follow-up assessment;NICU baby Mom plans to formula/bottle feed. Discussed how to dry her milk.   Maternal Data    Feeding    LATCH Score/Interventions                      Lactation Tools Discussed/Used     Consult Status Consult Status: Complete Date: 12/21/16 Follow-up type: In-patient    Alfred Levins 12/21/2016, 9:06 AM

## 2016-12-23 ENCOUNTER — Ambulatory Visit (HOSPITAL_COMMUNITY): Admission: RE | Admit: 2016-12-23 | Payer: Medicaid Other | Source: Ambulatory Visit

## 2016-12-26 ENCOUNTER — Encounter: Payer: Self-pay | Admitting: Obstetrics & Gynecology

## 2016-12-30 ENCOUNTER — Ambulatory Visit (HOSPITAL_COMMUNITY): Payer: Medicaid Other

## 2017-01-02 ENCOUNTER — Encounter: Payer: Self-pay | Admitting: Obstetrics and Gynecology

## 2017-01-06 ENCOUNTER — Ambulatory Visit (HOSPITAL_COMMUNITY): Payer: Medicaid Other

## 2017-01-09 ENCOUNTER — Encounter: Payer: Self-pay | Admitting: Obstetrics and Gynecology

## 2017-01-13 ENCOUNTER — Ambulatory Visit (HOSPITAL_COMMUNITY): Payer: Medicaid Other

## 2017-01-23 ENCOUNTER — Encounter: Payer: Self-pay | Admitting: Obstetrics and Gynecology

## 2017-01-23 ENCOUNTER — Ambulatory Visit (INDEPENDENT_AMBULATORY_CARE_PROVIDER_SITE_OTHER): Payer: Medicaid Other | Admitting: Obstetrics and Gynecology

## 2017-01-23 MED ORDER — AZITHROMYCIN 250 MG PO TABS: ORAL_TABLET | ORAL | 0 refills | Status: DC

## 2017-01-23 MED ORDER — NORGESTIMATE-ETH ESTRADIOL 0.25-35 MG-MCG PO TABS: 1.0000 | ORAL_TABLET | Freq: Every day | ORAL | 11 refills | Status: DC

## 2017-01-23 NOTE — Progress Notes (Signed)
Post Partum Exam  Connie White is a 21 y.o. [id number] female who presents for a postpartum visit. She is 5 weeks postpartum following a spontaneous vaginal delivery. I have fully reviewed the prenatal and intrapartum course. The delivery was at 36 gestational weeks.  Anesthesia: none. Postpartum course has been normal. Baby's course has been normal. Baby is feeding by bottle - Similac Advance. Bleeding red. Bowel function is normal. Bladder function is normal. Patient is not sexually active. Contraception method is abstinence. Postpartum depression screening:neg     Review of Systems Pertinent items are noted in HPI.    Objective:  Blood pressure 112/76, pulse 90, weight 141 lb 6.4 oz (64.1 kg), last menstrual period 01/22/2017, not currently breastfeeding.  General:  alert, cooperative and no distress   Breasts:  inspection negative, no nipple discharge or bleeding, no masses or nodularity palpable  Lungs: clear to auscultation bilaterally  Heart:  regular rate and rhythm  Abdomen: soft, non-tender; bowel sounds normal; no masses,  no organomegaly   Vulva:  normal  Vagina: normal vagina, no discharge, exudate, lesion, or erythema  Cervix:  multiparous appearance  Corpus: normal size, contour, position, consistency, mobility, non-tender  Adnexa:  normal adnexa and no mass, fullness, tenderness  Rectal Exam: Not performed.        Assessment:    Normal postpartum exam. Pap smear not done at today's visit.   Plan:   1. Contraception: OCP (estrogen/progesterone) and Nexplanon (it has been ordered for the patient) 2. Patient is medically cleared to resume all activities of daily living Patient with history of GDM and needs to return in 2 weeks for glucola test 3. Follow up in: 2 weeks for Nexplanon insertion and glucola or as needed.

## 2017-02-11 ENCOUNTER — Encounter: Payer: Self-pay | Admitting: Obstetrics and Gynecology

## 2017-02-11 ENCOUNTER — Ambulatory Visit (INDEPENDENT_AMBULATORY_CARE_PROVIDER_SITE_OTHER): Payer: Medicaid Other | Admitting: Obstetrics and Gynecology

## 2017-02-11 ENCOUNTER — Other Ambulatory Visit: Payer: Medicaid Other

## 2017-02-11 VITALS — BP 117/80 | HR 84 | Wt 144.0 lb

## 2017-02-11 DIAGNOSIS — O24415 Gestational diabetes mellitus in pregnancy, controlled by oral hypoglycemic drugs: Secondary | ICD-10-CM

## 2017-02-11 DIAGNOSIS — Z3049 Encounter for surveillance of other contraceptives: Secondary | ICD-10-CM | POA: Diagnosis not present

## 2017-02-11 DIAGNOSIS — Z30017 Encounter for initial prescription of implantable subdermal contraceptive: Secondary | ICD-10-CM

## 2017-02-11 DIAGNOSIS — Z3202 Encounter for pregnancy test, result negative: Secondary | ICD-10-CM | POA: Diagnosis not present

## 2017-02-11 LAB — POCT URINE PREGNANCY: Preg Test, Ur: NEGATIVE

## 2017-02-11 MED ORDER — ETONOGESTREL 68 MG ~~LOC~~ IMPL
68.0000 mg | DRUG_IMPLANT | Freq: Once | SUBCUTANEOUS | Status: AC
  Administered 2017-02-11: 68 mg via SUBCUTANEOUS

## 2017-02-11 NOTE — Progress Notes (Signed)
Patient presents for Nexplanon Insert and Glucola Test.  UPT is Negative.  Connie White is a 21 y.o. [id number] here for Nexplanon insertion.  UPT negative.  Nexplanon Insertion Procedure Patient identified, informed consent performed, consent signed.   Patient does understand that irregular bleeding is a very common side effect of this medication. She was advised to have backup contraception for one week after placement. Pregnancy test in clinic today was negative.  Appropriate time out taken.  Patient's left arm was prepped and draped in the usual sterile fashion.. The ruler used to measure and mark insertion area.  Patient was prepped with alcohol swab and then injected with 3 ml of 1% lidocaine.  She was prepped with betadine, Nexplanon removed from packaging,  Device confirmed in needle, then inserted full length of needle and withdrawn per handbook instructions. Nexplanon was able to palpated in the patient's arm; patient palpated the insert herself. There was minimal blood loss.  Patient insertion site covered with guaze and a pressure bandage to reduce any bruising.  The patient tolerated the procedure well and was given post procedure instructions.   Connie StaggersMichael L Ervin, MD, FACOG Attending Obstetrician & Gynecologist Center [facility], [facility]

## 2017-02-11 NOTE — Patient Instructions (Signed)
Etonogestrel implant What is this medicine? ETONOGESTREL (et oh noe JES trel) is a contraceptive (birth control) device. It is used to prevent pregnancy. It can be used for up to 3 years. This medicine may be used for other purposes; ask your health care provider or pharmacist if you have questions. COMMON BRAND NAME(S): Implanon, Nexplanon What should I tell my health care provider before I take this medicine? They need to know if you have any of these conditions: -abnormal vaginal bleeding -blood vessel disease or blood clots -cancer of the breast, cervix, or liver -depression -diabetes -gallbladder disease -headaches -heart disease or recent heart attack -high blood pressure -high cholesterol -kidney disease -liver disease -renal disease -seizures -tobacco smoker -an unusual or allergic reaction to etonogestrel, other hormones, anesthetics or antiseptics, medicines, foods, dyes, or preservatives -pregnant or trying to get pregnant -breast-feeding How should I use this medicine? This device is inserted just under the skin on the inner side of your upper arm by a health care professional. Talk to your pediatrician regarding the use of this medicine in children. Special care may be needed. Overdosage: If you think you have taken too much of this medicine contact a poison control center or emergency room at once. NOTE: This medicine is only for you. Do not share this medicine with others. What if I miss a dose? This does not apply. What may interact with this medicine? Do not take this medicine with any of the following medications: -amprenavir -bosentan -fosamprenavir This medicine may also interact with the following medications: -barbiturate medicines for inducing sleep or treating seizures -certain medicines for fungal infections like ketoconazole and itraconazole -grapefruit juice -griseofulvin -medicines to treat seizures like carbamazepine, felbamate, oxcarbazepine,  phenytoin, topiramate -modafinil -phenylbutazone -rifampin -rufinamide -some medicines to treat HIV infection like atazanavir, indinavir, lopinavir, nelfinavir, tipranavir, ritonavir -St. John's wort This list may not describe all possible interactions. Give your health care provider a list of all the medicines, herbs, non-prescription drugs, or dietary supplements you use. Also tell them if you smoke, drink alcohol, or use illegal drugs. Some items may interact with your medicine. What should I watch for while using this medicine? This product does not protect you against HIV infection (AIDS) or other sexually transmitted diseases. You should be able to feel the implant by pressing your fingertips over the skin where it was inserted. Contact your doctor if you cannot feel the implant, and use a non-hormonal birth control method (such as condoms) until your doctor confirms that the implant is in place. If you feel that the implant may have broken or become bent while in your arm, contact your healthcare provider. What side effects may I notice from receiving this medicine? Side effects that you should report to your doctor or health care professional as soon as possible: -allergic reactions like skin rash, itching or hives, swelling of the face, lips, or tongue -breast lumps -changes in emotions or moods -depressed mood -heavy or prolonged menstrual bleeding -pain, irritation, swelling, or bruising at the insertion site -scar at site of insertion -signs of infection at the insertion site such as fever, and skin redness, pain or discharge -signs of pregnancy -signs and symptoms of a blood clot such as breathing problems; changes in vision; chest pain; severe, sudden headache; pain, swelling, warmth in the leg; trouble speaking; sudden numbness or weakness of the face, arm or leg -signs and symptoms of liver injury like dark yellow or brown urine; general ill feeling or flu-like symptoms;  light-colored   stools; loss of appetite; nausea; right upper belly pain; unusually weak or tired; yellowing of the eyes or skin -unusual vaginal bleeding, discharge -signs and symptoms of a stroke like changes in vision; confusion; trouble speaking or understanding; severe headaches; sudden numbness or weakness of the face, arm or leg; trouble walking; dizziness; loss of balance or coordination Side effects that usually do not require medical attention (report to your doctor or health care professional if they continue or are bothersome): -acne -back pain -breast pain -changes in weight -dizziness -general ill feeling or flu-like symptoms -headache -irregular menstrual bleeding -nausea -sore throat -vaginal irritation or inflammation This list may not describe all possible side effects. Call your doctor for medical advice about side effects. You may report side effects to FDA at 1-800-FDA-1088. Where should I keep my medicine? This drug is given in a hospital or clinic and will not be stored at home. NOTE: This sheet is a summary. It may not cover all possible information. If you have questions about this medicine, talk to your doctor, pharmacist, or health care provider.  2018 Elsevier/Gold Standard (2016-03-21 11:19:22)  

## 2017-02-12 LAB — GLUCOSE TOLERANCE, 2 HOURS W/ 1HR
GLUCOSE, 2 HOUR: 201 mg/dL — AB (ref 65–152)
GLUCOSE, FASTING: 124 mg/dL — AB (ref 65–91)
Glucose, 1 hour: 278 mg/dL — ABNORMAL HIGH (ref 65–179)

## 2017-03-08 IMAGING — US US ABDOMEN LIMITED
1 series · 15 of 25 positions shown · non-contrast
Comparison: Ultrasound 09/24/2016.

CLINICAL DATA: Abdominal pain.

EXAM:
US ABDOMEN LIMITED - RIGHT UPPER QUADRANT

[Series 1: us abdomen limited · 15 of 60 slices shown]
[im 1/60]
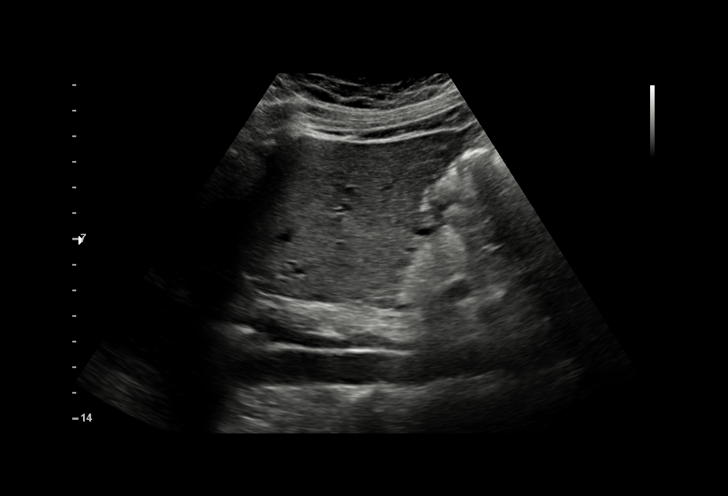
[im 5/60]
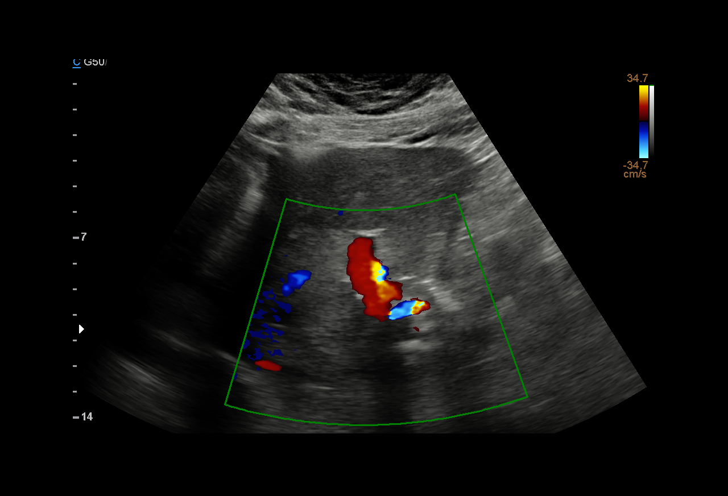
[im 10/60]
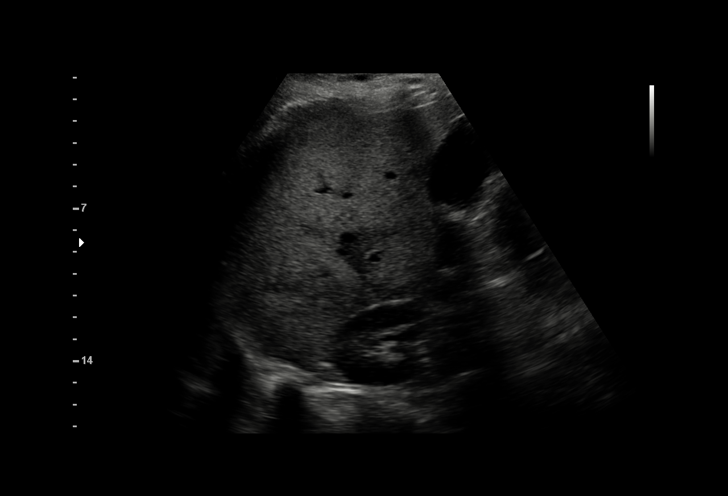
[im 13/60]
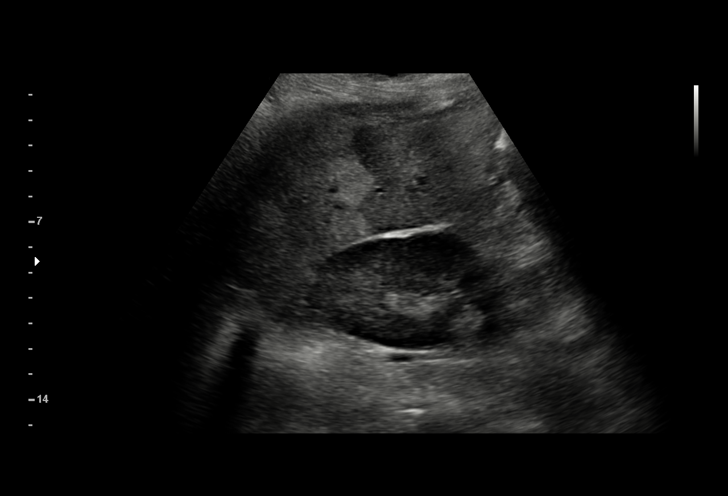
[im 18/60]
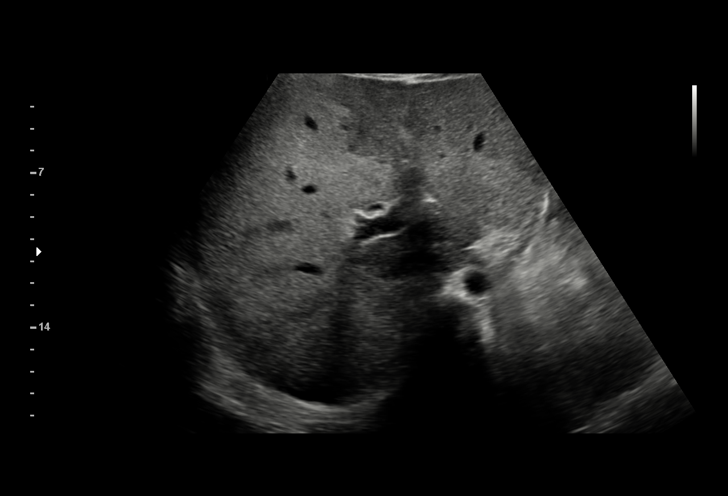
[im 23/60]
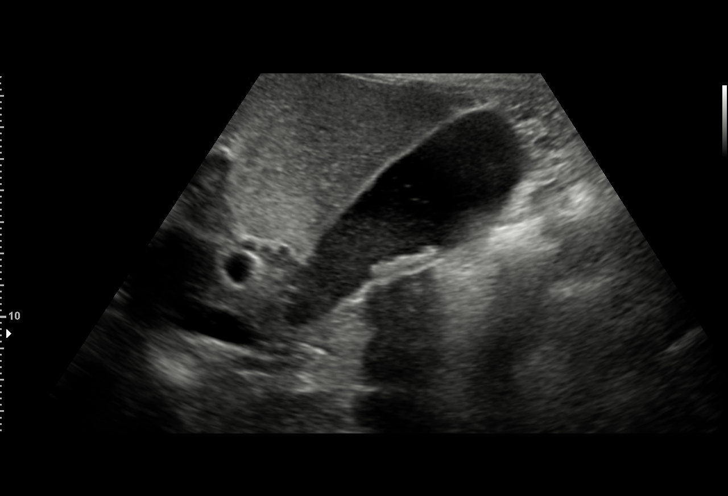
[im 25/60]
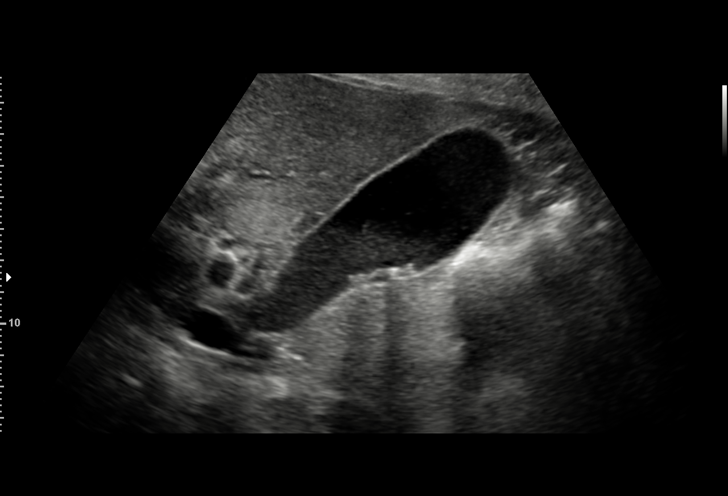
[im 30/60]
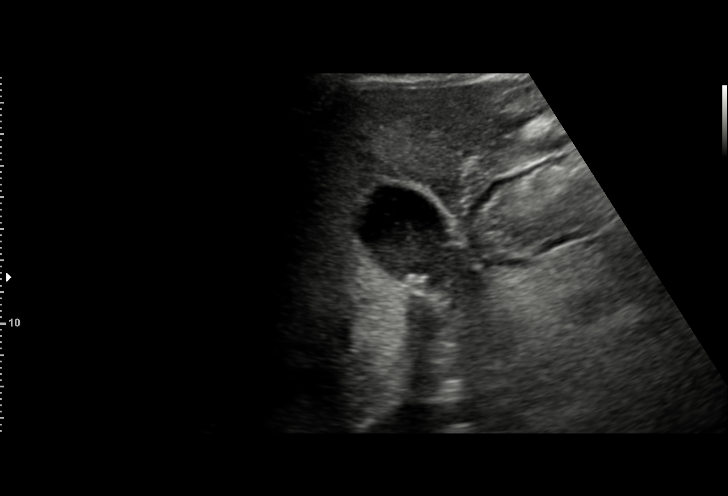
[im 35/60]
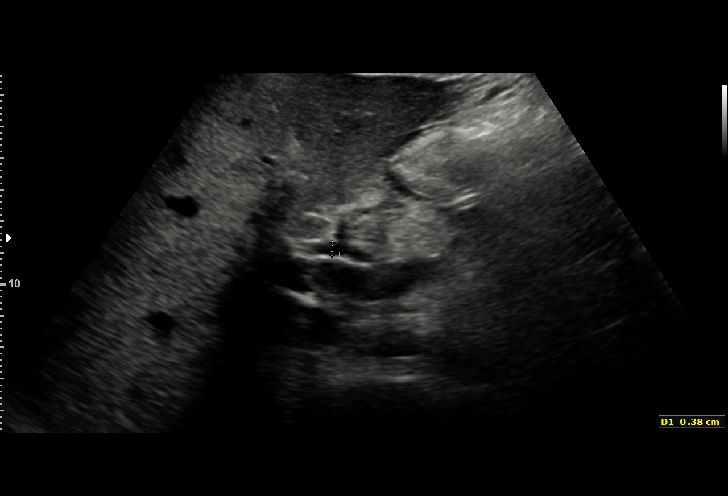
[im 37/60]
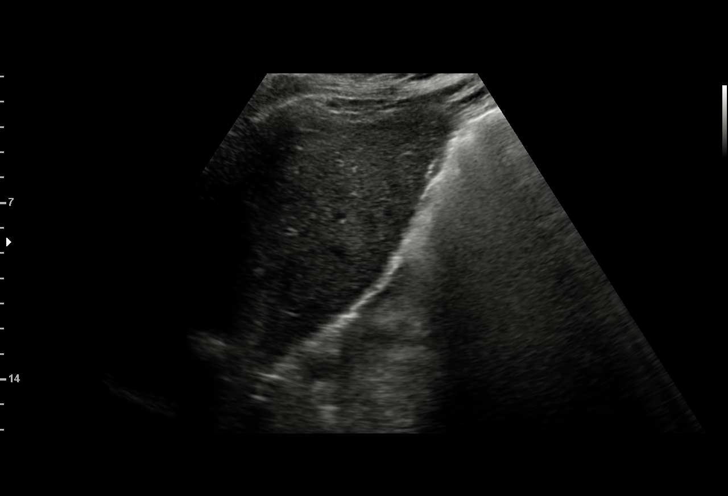
[im 42/60]
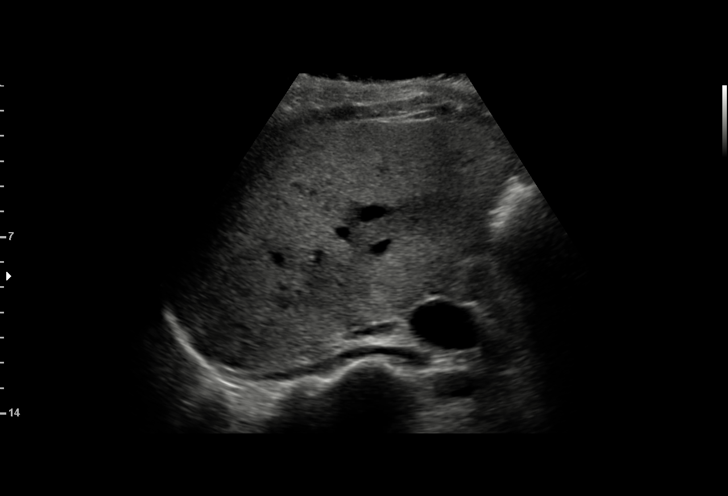
[im 47/60]
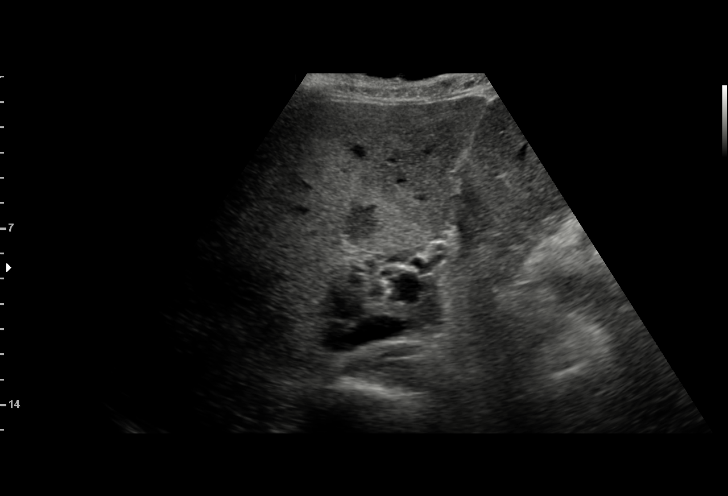
[im 50/60]
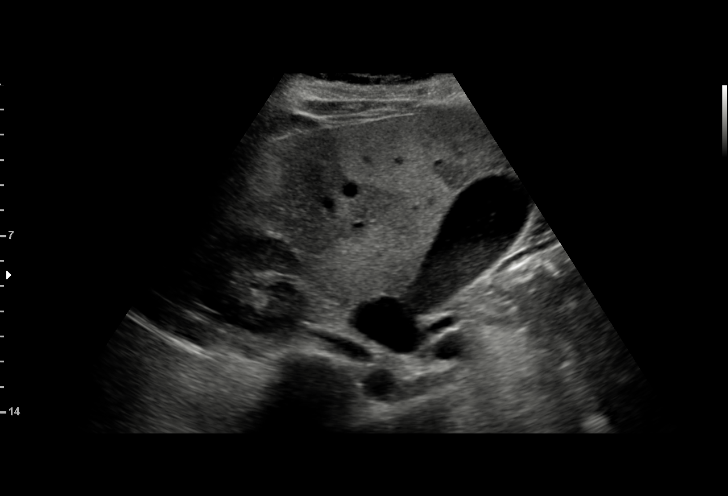
[im 55/60]
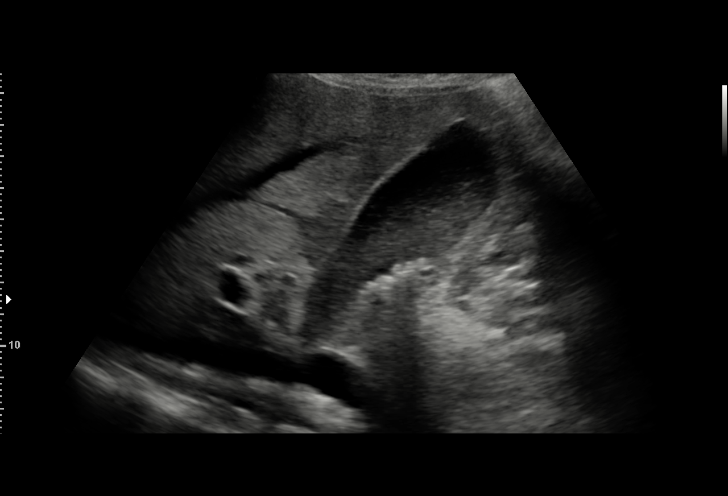
[im 60/60]
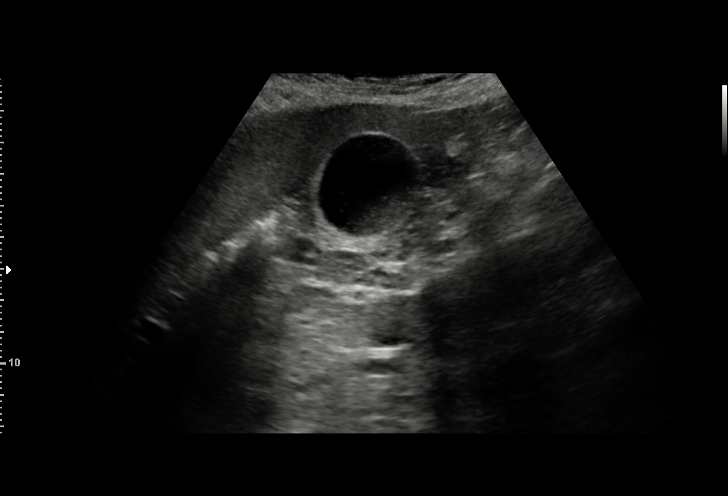

[15 of 25 positions shown; findings below may reference images not displayed]

FINDINGS: Gallbladder:

Multiple gallstones.  Sludge.  Gallbladder wall thickness 1.7 mm.

Common bile duct:

Diameter: 4.7 mm

Liver:

Heterogeneous parenchymal pattern consistent with fatty infiltration
and/or hepatocellular disease.
IMPRESSION: 1. Gallstones and sludge. No gallbladder wall thickening. No biliary
distention.

2. Heterogeneous hepatic parenchymal pattern consistent with fatty
infiltration oral and or hepatocellular disease.

## 2017-03-11 ENCOUNTER — Telehealth: Payer: Self-pay

## 2017-03-11 ENCOUNTER — Ambulatory Visit (INDEPENDENT_AMBULATORY_CARE_PROVIDER_SITE_OTHER): Payer: Medicaid Other | Admitting: Obstetrics and Gynecology

## 2017-03-11 ENCOUNTER — Encounter: Payer: Self-pay | Admitting: Obstetrics and Gynecology

## 2017-03-11 DIAGNOSIS — E118 Type 2 diabetes mellitus with unspecified complications: Secondary | ICD-10-CM

## 2017-03-11 DIAGNOSIS — Z975 Presence of (intrauterine) contraceptive device: Secondary | ICD-10-CM

## 2017-03-11 DIAGNOSIS — E119 Type 2 diabetes mellitus without complications: Secondary | ICD-10-CM | POA: Insufficient documentation

## 2017-03-11 DIAGNOSIS — Z3049 Encounter for surveillance of other contraceptives: Secondary | ICD-10-CM | POA: Diagnosis not present

## 2017-03-11 MED ORDER — ETONOGESTREL 68 MG ~~LOC~~ IMPL: 68.0000 mg | DRUG_IMPLANT | Freq: Once | SUBCUTANEOUS | Status: DC

## 2017-03-11 NOTE — Progress Notes (Signed)
Patient presents for follow up from Nexplanon Insert.   As noted. Doing well. No bleeding since insertion.  PE AF VSS Lungs clear  Heart RRR Abd soft + BS LUE nexplanon in place  A/P Nexplanon contraception        DM  Doing well. Pt failed follow up glucola screening after GDM. Will make sure pt is being followed by PCP. F/U PRN from a GYN standpoint

## 2017-03-11 NOTE — Telephone Encounter (Signed)
Contacted pt and advised per Dr. Alysia PennaErvin that she failed her glucola postpartum and she needs to see PCP for DM management. Pt stated that she is trying to get her insurance situation together before she goes.

## 2017-04-22 IMAGING — US US MFM FETAL BPP W/O NON-STRESS
1 series · 13 of 15 positions shown · non-contrast
Comparison: none

[Series 1: us mfm fetal bpp w/o non-stress · 15 acquisitions, 13 frames shown]
[im 1/15]
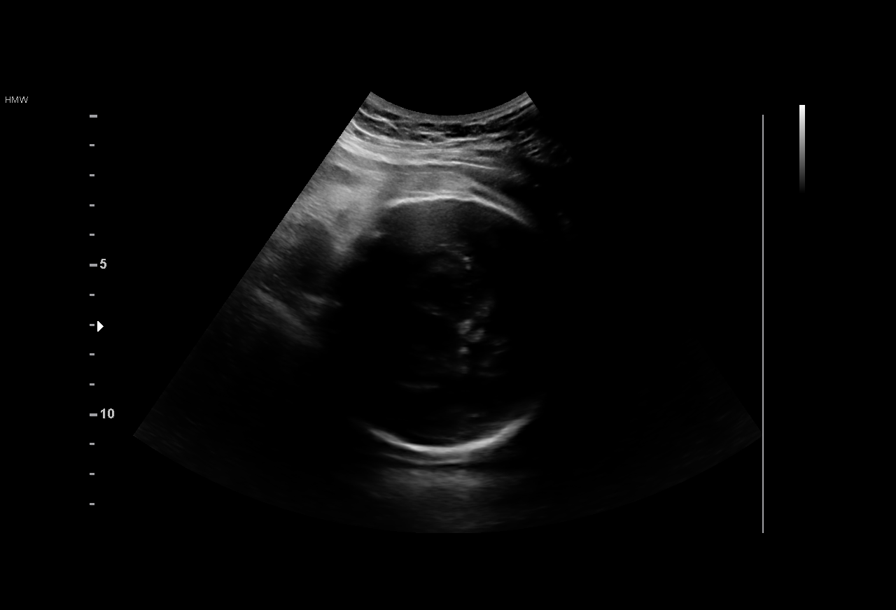
[im 2/15]
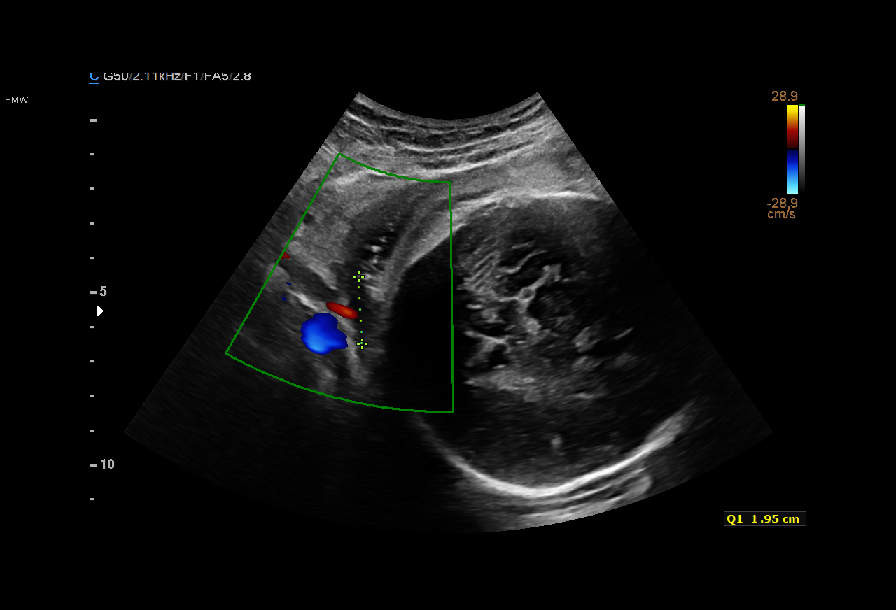
[im 3/15]
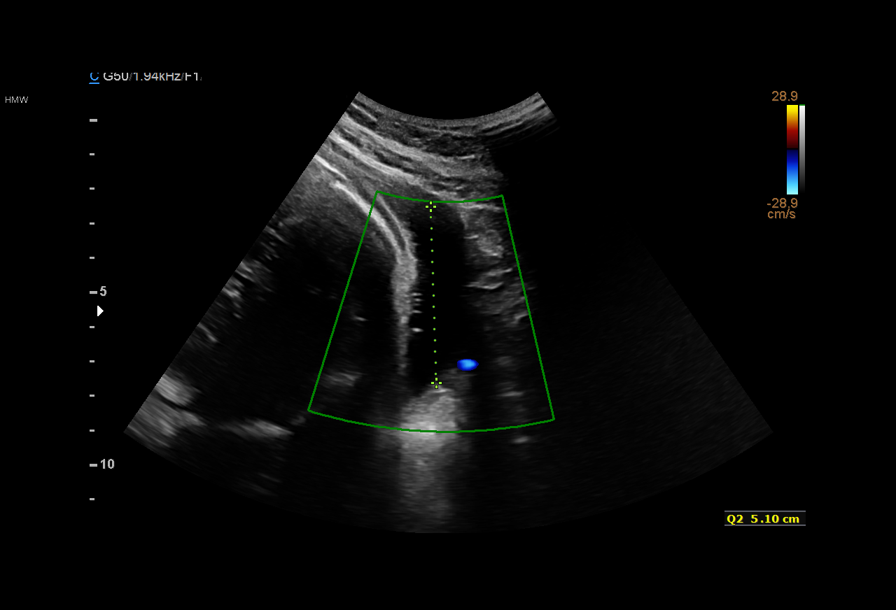
[im 5/15]
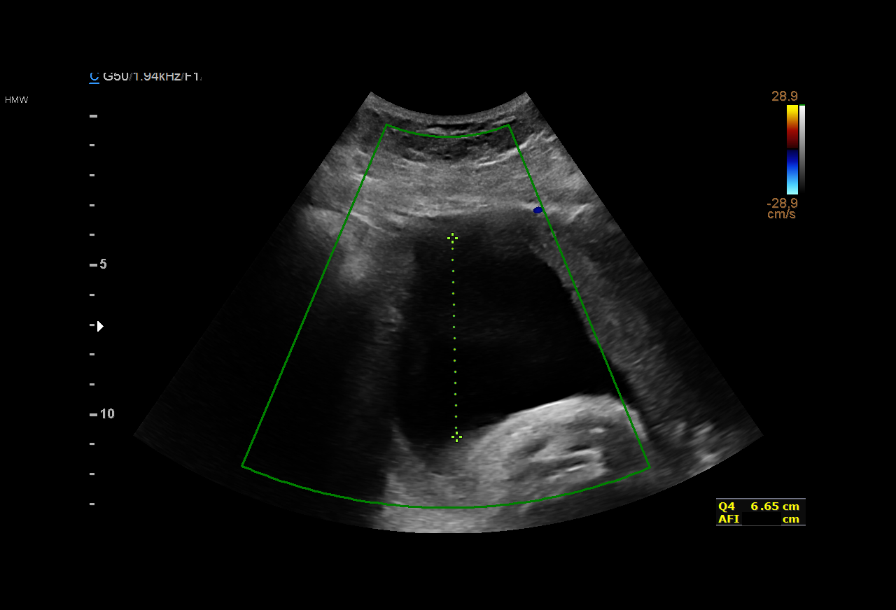
[im 6/15]
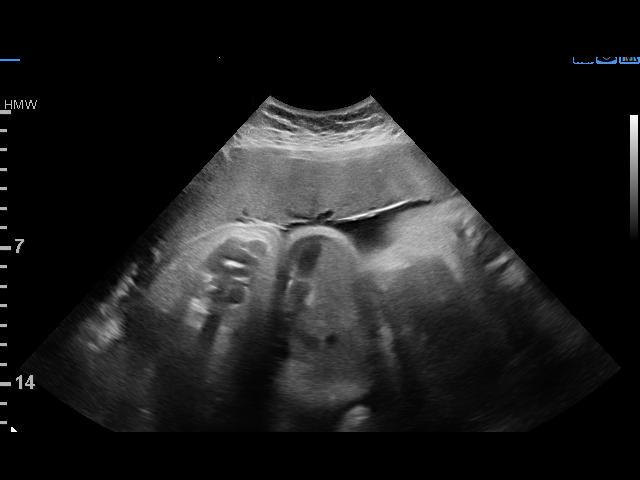
[im 7/15]
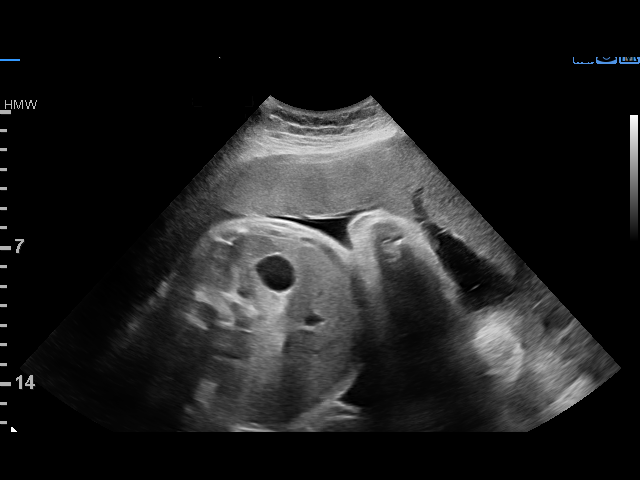
[im 8/15]
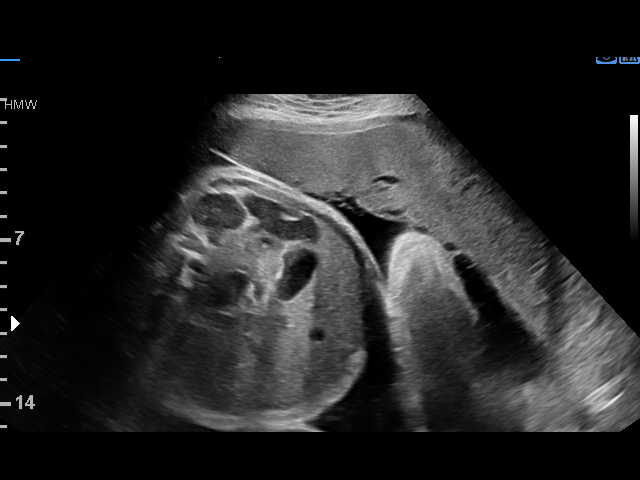
[im 9/15]
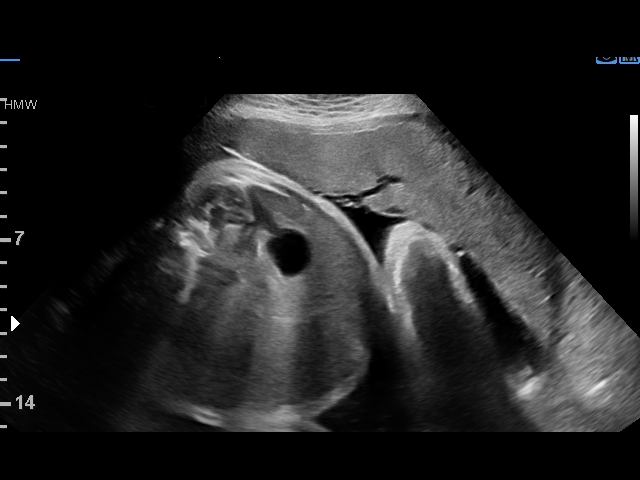
[im 10/15]
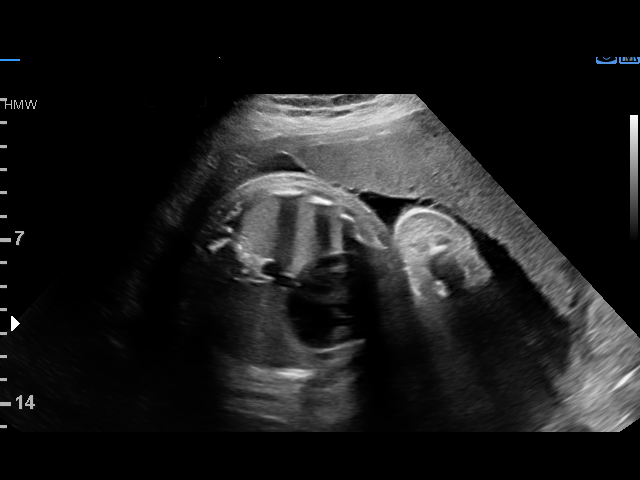
[im 11/15]
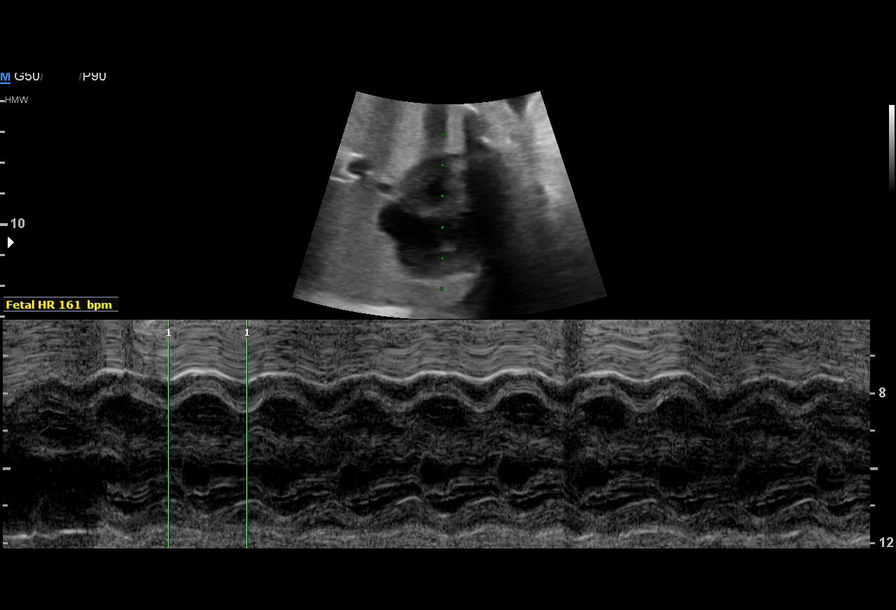
[im 13/15]
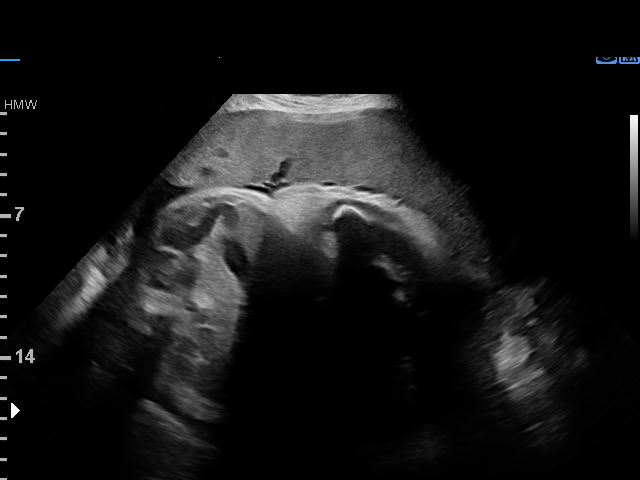
[im 14/15]
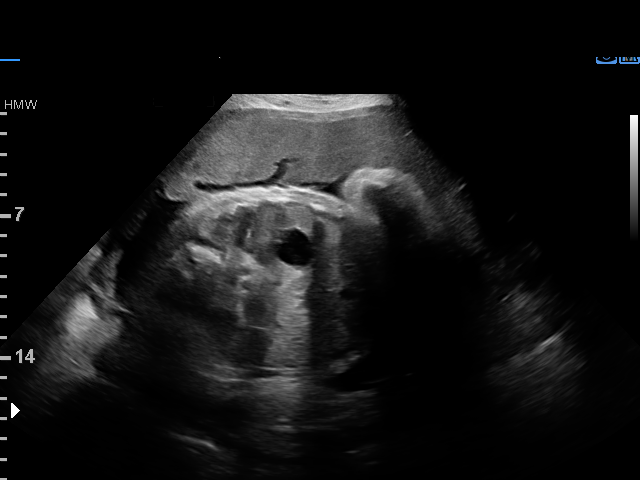
[im 15/15]
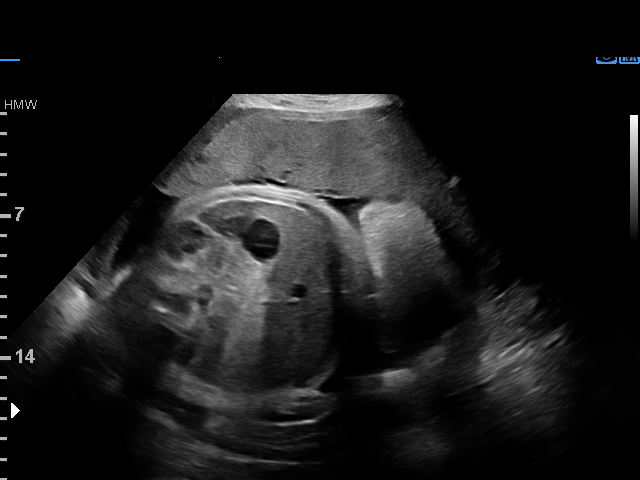

[13 of 15 positions shown; findings below may reference images not displayed]

Performed By:     Bugh Dagang          Secondary Phy.:   [REDACTED]
OB/Gyn Clinic

1  HAKUSEMBE SAFARIS            [id number]      [id number]     [id number]
Indications

33 weeks gestation of pregnancy
Poor obstetric history: Previous preterm
delivery, antepartum (77wks); 17P
Poor obstetric history: Previous gestational
diabetes
Gestational diabetes in pregnancy,
controlled by oral hypoglycemic drugs; on
glyburide
Obesity complicating pregnancy, third
trimester
OB History

Blood Type:            Height:  4'7"   Weight (lb):  149      BMI:
Gravidity:    3         Prem:   1         SAB:   1
Living:       1
Fetal Evaluation

Num Of Fetuses:     1
Fetal Heart         161
Rate(bpm):
Cardiac Activity:   Observed
Presentation:       Cephalic
Amniotic Fluid
AFI FV:      Subjectively within normal limits

AFI Sum(cm)     %Tile       Largest Pocket(cm)
19.53           73

RUQ(cm)       RLQ(cm)       LUQ(cm)        LLQ(cm)
1.95
Biophysical Evaluation

Amniotic F.V:   Within normal limits       F. Tone:        Observed
F. Movement:    Observed                   Score:          [DATE]
F. Breathing:   Observed
Gestational Age

LMP:           35w 2d       Date:   03/30/16                 EDD:   01/04/17
Best:          33w 6d    Det. By:   Early Ultrasound         EDD:   01/14/17
(05/22/16)
Impression

Single IUP at 33w 6d here for GDM on glyburide, hx of
previous preterm delivery
BPP [DATE]
Normal amniotic fluid volume
Recommendations

Continue antenatal testing as scheduled
Serial ultrasounds for growth every 4 weeks

## 2017-07-09 ENCOUNTER — Ambulatory Visit (HOSPITAL_COMMUNITY)
Admission: EM | Admit: 2017-07-09 | Discharge: 2017-07-09 | Disposition: A | Discharge status: Home / Self Care | Payer: BLUE CROSS/BLUE SHIELD | Attending: Family Medicine | Admitting: Family Medicine

## 2017-07-09 ENCOUNTER — Encounter (HOSPITAL_COMMUNITY): Payer: Self-pay | Admitting: Family Medicine

## 2017-07-09 DIAGNOSIS — J00 Acute nasopharyngitis [common cold]: Secondary | ICD-10-CM

## 2017-07-09 MED ORDER — FLUTICASONE PROPIONATE 50 MCG/ACT NA SUSP: 2.0000 | Freq: Every day | NASAL | 0 refills | Status: DC

## 2017-07-09 MED ORDER — CETIRIZINE-PSEUDOEPHEDRINE ER 5-120 MG PO TB12: 1.0000 | ORAL_TABLET | Freq: Every day | ORAL | 0 refills | Status: DC

## 2017-07-09 MED ORDER — BENZONATATE 100 MG PO CAPS: 100.0000 mg | ORAL_CAPSULE | Freq: Three times a day (TID) | ORAL | 0 refills | Status: DC

## 2017-07-09 NOTE — ED Triage Notes (Signed)
Pt here for cough, body aches since Monday. Taking Thera flu without relief.

## 2017-07-09 NOTE — ED Provider Notes (Signed)
[facility]    CSN: [id number] Arrival date & time: 07/09/17  1309     History   Chief Complaint Chief Complaint  Patient presents with  . Cough  . Generalized Body Aches    HPI Connie White is a 21 y.o. female.   21 year old female with history of gestational diabetes comes in for 3 day history of cough, body aches, sore throat. Neck pain/stiffiness, and experiences mild eye soreness. Nasal congestion without rhinorrhea. Mild otalgia bilaterally. Denies fever, chills, night sweats. Chest wall pain with coughing and movement. Denies shortness of breath. Decreased appetite due to dysphagia. Denies nausea/vomiting. Had lower abdominal pain that has since resolved. Has tried theraflu without relief. Positive sick contact. Denies history of seasonal allergies. Current every other day smoker with 1-2 cigarette.       Past Medical History:  Diagnosis Date  . Gestational diabetes     Patient Active Problem List   Diagnosis Date Noted  . Nexplanon in place 03/11/2017  . Diabetes (HCC) 03/11/2017    Past Surgical History:  Procedure Laterality Date  . CHOLECYSTECTOMY N/A 10/22/2016   Procedure: LAPAROSCOPIC CHOLECYSTECTOMY;  Surgeon: Claud KelpHaywood Ingram, MD;  Location: [facility];  Service: General;  Laterality: N/A;    OB History    Gravida Para Term Preterm AB Living   3 2 0 2 1 2    SAB TAB Ectopic Multiple Live Births   1 0 0 0 2       Home Medications    Prior to Admission medications   Medication Sig Start Date End Date Taking? Authorizing Provider  acetaminophen (TYLENOL) 325 MG tablet Take 2 tablets (650 mg total) by mouth every 4 (four) hours as needed (for pain scale < 4). 12/21/16   Lorne SkeensSchenk, Nicholas Michael, MD  benzonatate (TESSALON) 100 MG capsule Take 1 capsule (100 mg total) by mouth every 8 (eight) hours. 07/09/17   Cathie HoopsYu, Eloina Ergle V, PA-C  cetirizine-pseudoephedrine (ZYRTEC-D) 5-120 MG tablet Take 1 tablet by mouth daily. 07/09/17   Cathie HoopsYu, Lional Icenogle V, PA-C    fluticasone (FLONASE) 50 MCG/ACT nasal spray Place 2 sprays into both nostrils daily. 07/09/17   Cathie HoopsYu, Konnie Noffsinger V, PA-C  ibuprofen (ADVIL,MOTRIN) 600 MG tablet Take 1 tablet (600 mg total) by mouth every 6 (six) hours. 12/21/16   Lorne SkeensSchenk, Nicholas Michael, MD  pantoprazole (PROTONIX) 40 MG tablet Take 1 tablet (40 mg total) by mouth daily. 08/20/16 08/20/17  Adam PhenixArnold, James G, MD  Prenatal Vit-Fe Phos-FA-Omega (VITAFOL GUMMIES) 3.33-0.333-34.8 MG CHEW Chew 3 tablets by mouth daily before breakfast. 06/12/16   Brock BadHarper, Charles A, MD    Family History Family History  Problem Relation Age of Onset  . Diabetes Mother     Social History Social History  Substance Use Topics  . Smoking status: Former Smoker    Quit date: 05/17/2016  . Smokeless tobacco: Never Used  . Alcohol use No     Allergies   Patient has no known allergies.   Review of Systems Review of Systems  Reason unable to perform ROS: See HPI as above.     Physical Exam Triage Vital Signs ED Triage Vitals  Enc Vitals Group     BP 07/09/17 1323 121/80     Pulse Rate 07/09/17 1323 75     Resp 07/09/17 1323 18     Temp 07/09/17 1323 98.3 F (36.8 C)     Temp src --      SpO2 07/09/17 1323 97 %  Weight --      Height --      Head Circumference --      Peak Flow --      Pain Score 07/09/17 1321 7     Pain Loc --      Pain Edu? --      Excl. in GC? --    No data found.   Updated Vital Signs BP 121/80   Pulse 75   Temp 98.3 F (36.8 C)   Resp 18   SpO2 97%    Physical Exam  Constitutional: She is oriented to person, place, and time. She appears well-developed and well-nourished. No distress.  HENT:  Head: Normocephalic and atraumatic.  Right Ear: External ear and ear canal normal. Tympanic membrane is erythematous. Tympanic membrane is not bulging.  Left Ear: External ear and ear canal normal. Tympanic membrane is erythematous. Tympanic membrane is not bulging.  Nose: Mucosal edema and rhinorrhea present. Right  sinus exhibits no maxillary sinus tenderness and no frontal sinus tenderness. Left sinus exhibits no maxillary sinus tenderness and no frontal sinus tenderness.  Mouth/Throat: Uvula is midline and mucous membranes are normal. Posterior oropharyngeal erythema present. Tonsils are 2+ on the right. Tonsils are 2+ on the left. No tonsillar exudate.  Eyes: Pupils are equal, round, and reactive to light. Conjunctivae and EOM are normal.  Neck: Normal range of motion. Neck supple. Muscular tenderness present. No spinous process tenderness present. Normal range of motion present.  Cardiovascular: Normal rate, regular rhythm and normal heart sounds.  Exam reveals no gallop and no friction rub.   No murmur heard. Pulmonary/Chest: Effort normal and breath sounds normal. She has no decreased breath sounds. She has no wheezes. She has no rhonchi. She has no rales.  Lymphadenopathy:    She has no cervical adenopathy.  Neurological: She is alert and oriented to person, place, and time. She has normal strength. She is not disoriented. No cranial nerve deficit or sensory deficit. She displays a negative Romberg sign. GCS eye subscore is 4. GCS verbal subscore is 5. GCS motor subscore is 6.  Skin: Skin is warm and dry.  Psychiatric: She has a normal mood and affect. Her behavior is normal. Judgment normal.     UC Treatments / Results  Labs (all labs ordered are listed, but only abnormal results are displayed) Labs Reviewed - No data to display  EKG  EKG Interpretation None       Radiology No results found.  Procedures Procedures (including critical care time)  Medications Ordered in UC Medications - No data to display   Initial Impression / Assessment and Plan / UC Course  I have reviewed the triage vital signs and the nursing notes.  Pertinent labs & imaging results that were available during my care of the patient were reviewed by me and considered in my medical decision making (see chart for  details).    Discussed with patient history and exam most consistent with viral URI. Symptomatic treatment as needed. Push fluids. Given patient is afebrile, with full ROM of neck, without confusion, normal neurology exam, low suspicion for meningitis. Return precautions given.    Final Clinical Impressions(s) / UC Diagnoses   Final diagnoses:  Acute nasopharyngitis    New Prescriptions Discharge Medication List as of 07/09/2017  2:07 PM    START taking these medications   Details  benzonatate (TESSALON) 100 MG capsule Take 1 capsule (100 mg total) by mouth every 8 (eight) hours., Starting Wed 07/09/2017,  Normal    cetirizine-pseudoephedrine (ZYRTEC-D) 5-120 MG tablet Take 1 tablet by mouth daily., Starting Wed 07/09/2017, Normal    fluticasone (FLONASE) 50 MCG/ACT nasal spray Place 2 sprays into both nostrils daily., Starting Wed 07/09/2017, Normal         Linward Headland V, PA-C 07/09/17 1428

## 2017-07-09 NOTE — Discharge Instructions (Signed)
Tessalon for cough. Start flonase, zyrtec-D for nasal congestion. You can use over the counter nasal saline rinse such as neti pot for nasal congestion. Keep hydrated, your urine should be clear to pale yellow in color. Tylenol/motrin for fever and pain. Monitor for any worsening of symptoms, chest pain, shortness of breath, wheezing, swelling of the throat, follow up for reevaluation. If experiencing headache, sensitivity to light, fever, unable to move neck up and down, confusion, go to the emergency department for further evaluation.   For sore throat try using a honey-based tea. Use 3 teaspoons of honey with juice squeezed from half lemon. Place shaved pieces of ginger into 1/2-1 cup of water and warm over stove top. Then mix the ingredients and repeat every 4 hours as needed.

## 2017-10-28 ENCOUNTER — Other Ambulatory Visit: Payer: Self-pay

## 2017-10-28 ENCOUNTER — Ambulatory Visit (HOSPITAL_COMMUNITY)
Admission: EM | Admit: 2017-10-28 | Discharge: 2017-10-28 | Disposition: A | Discharge status: Home / Self Care | Payer: BLUE CROSS/BLUE SHIELD | Attending: Family Medicine | Admitting: Family Medicine

## 2017-10-28 ENCOUNTER — Encounter (HOSPITAL_COMMUNITY): Payer: Self-pay | Admitting: Emergency Medicine

## 2017-10-28 DIAGNOSIS — R69 Illness, unspecified: Secondary | ICD-10-CM | POA: Diagnosis not present

## 2017-10-28 DIAGNOSIS — J111 Influenza due to unidentified influenza virus with other respiratory manifestations: Secondary | ICD-10-CM

## 2017-10-28 MED ORDER — IPRATROPIUM BROMIDE 0.06 % NA SOLN
2.0000 | Freq: Four times a day (QID) | NASAL | 0 refills | Status: DC
Start: 2017-10-28 — End: 2018-12-08

## 2017-10-28 MED ORDER — MELOXICAM 7.5 MG PO TABS: 7.5000 mg | ORAL_TABLET | Freq: Every day | ORAL | 0 refills | Status: DC

## 2017-10-28 MED ORDER — ONDANSETRON 4 MG PO TBDP: 4.0000 mg | ORAL_TABLET | Freq: Three times a day (TID) | ORAL | 0 refills | Status: DC | PRN

## 2017-10-28 NOTE — ED Triage Notes (Signed)
Pt reports flu like symptoms that started yesterday.  Highest recorded fever at home was 102.

## 2017-10-28 NOTE — ED Provider Notes (Signed)
[facility]    CSN: [id number] Arrival date & time: 10/28/17  1918     History   Chief Complaint Chief Complaint  Patient presents with  . Headache  . Generalized Body Aches  . Fever    HPI Connie White is a 22 y.o. female.   22 year old female comes in for 2-day history of URI symptoms.  Has had rhinorrhea, sore throat, body aches, diarrhea.  Nausea without vomiting.  Denies cough.  T-max 102, NyQuil, last dose yesterday.  Former smoker, less than 1 year, 1/2 pack a day.  No obvious sick contact.  No flu shot this year.       Past Medical History:  Diagnosis Date  . Gestational diabetes     Patient Active Problem List   Diagnosis Date Noted  . Nexplanon in place 03/11/2017  . Diabetes (HCC) 03/11/2017    Past Surgical History:  Procedure Laterality Date  . CHOLECYSTECTOMY N/A 10/22/2016   Procedure: LAPAROSCOPIC CHOLECYSTECTOMY;  Surgeon: Claud KelpHaywood Ingram, MD;  Location: [facility];  Service: General;  Laterality: N/A;    OB History    Gravida Para Term Preterm AB Living   3 2 0 2 1 2    SAB TAB Ectopic Multiple Live Births   1 0 0 0 2       Home Medications    Prior to Admission medications   Medication Sig Start Date End Date Taking? Authorizing Provider  acetaminophen (TYLENOL) 325 MG tablet Take 2 tablets (650 mg total) by mouth every 4 (four) hours as needed (for pain scale < 4). 12/21/16   Lorne SkeensSchenk, Nicholas Michael, MD  benzonatate (TESSALON) 100 MG capsule Take 1 capsule (100 mg total) by mouth every 8 (eight) hours. 07/09/17   Cathie HoopsYu, Amarionna Arca V, PA-C  cetirizine-pseudoephedrine (ZYRTEC-D) 5-120 MG tablet Take 1 tablet by mouth daily. 07/09/17   Cathie HoopsYu, Jaelee Laughter V, PA-C  fluticasone (FLONASE) 50 MCG/ACT nasal spray Place 2 sprays into both nostrils daily. 07/09/17   Cathie HoopsYu, Iridessa Harrow V, PA-C  ibuprofen (ADVIL,MOTRIN) 600 MG tablet Take 1 tablet (600 mg total) by mouth every 6 (six) hours. 12/21/16   Lorne SkeensSchenk, Nicholas Michael, MD  ipratropium (ATROVENT) 0.06 % nasal spray  Place 2 sprays into both nostrils 4 (four) times daily. 10/28/17   Cathie HoopsYu, Korey Arroyo V, PA-C  meloxicam (MOBIC) 7.5 MG tablet Take 1 tablet (7.5 mg total) by mouth daily. 10/28/17   Cathie HoopsYu, Cree Napoli V, PA-C  ondansetron (ZOFRAN ODT) 4 MG disintegrating tablet Take 1 tablet (4 mg total) by mouth every 8 (eight) hours as needed for nausea or vomiting. 10/28/17   Cathie HoopsYu, Casmir Auguste V, PA-C  pantoprazole (PROTONIX) 40 MG tablet Take 1 tablet (40 mg total) by mouth daily. 08/20/16 08/20/17  Adam PhenixArnold, James G, MD  Prenatal Vit-Fe Phos-FA-Omega (VITAFOL GUMMIES) 3.33-0.333-34.8 MG CHEW Chew 3 tablets by mouth daily before breakfast. 06/12/16   Brock BadHarper, Charles A, MD    Family History Family History  Problem Relation Age of Onset  . Diabetes Mother     Social History Social History   Tobacco Use  . Smoking status: Former Smoker    Last attempt to quit: 05/17/2016    Years since quitting: 1.4  . Smokeless tobacco: Never Used  Substance Use Topics  . Alcohol use: No  . Drug use: No     Allergies   Patient has no known allergies.   Review of Systems Review of Systems  Reason unable to perform ROS: See HPI as above.  Physical Exam Triage Vital Signs ED Triage Vitals  Enc Vitals Group     BP 10/28/17 1956 121/73     Pulse Rate 10/28/17 1956 93     Resp --      Temp 10/28/17 1956 99.6 F (37.6 C)     Temp Source 10/28/17 1956 Oral     SpO2 10/28/17 1956 100 %     Weight --      Height --      Head Circumference --      Peak Flow --      Pain Score 10/28/17 1952 8     Pain Loc --      Pain Edu? --      Excl. in [place]? --    No data found.  Updated Vital Signs BP 121/73 (BP Location: Left Arm)   Pulse 93   Temp 99.6 F (37.6 C) (Oral)   SpO2 100%   Physical Exam  Constitutional: She is oriented to person, place, and time. She appears well-developed and well-nourished. No distress.  HENT:  Head: Normocephalic and atraumatic.  Right Ear: Tympanic membrane, external ear and ear canal normal. Tympanic  membrane is not erythematous and not bulging.  Left Ear: Tympanic membrane, external ear and ear canal normal. Tympanic membrane is not erythematous and not bulging.  Nose: Mucosal edema and rhinorrhea present. Right sinus exhibits no maxillary sinus tenderness and no frontal sinus tenderness. Left sinus exhibits no maxillary sinus tenderness and no frontal sinus tenderness.  Mouth/Throat: Uvula is midline and mucous membranes are normal. Posterior oropharyngeal erythema present. No tonsillar exudate.  Eyes: Conjunctivae are normal. Pupils are equal, round, and reactive to light.  Neck: Normal range of motion. Neck supple.  Cardiovascular: Normal rate, regular rhythm and normal heart sounds. Exam reveals no gallop and no friction rub.  No murmur heard. Pulmonary/Chest: Effort normal and breath sounds normal. She has no decreased breath sounds. She has no wheezes. She has no rhonchi. She has no rales.  Abdominal: Soft. Bowel sounds are normal. There is no tenderness. There is no rebound and no guarding.  Lymphadenopathy:    She has no cervical adenopathy.  Neurological: She is alert and oriented to person, place, and time.  Skin: Skin is warm and dry.  Psychiatric: She has a normal mood and affect. Her behavior is normal. Judgment normal.     UC Treatments / Results  Labs (all labs ordered are listed, but only abnormal results are displayed) Labs Reviewed - No data to display  EKG  EKG Interpretation None       Radiology No results found.  Procedures Procedures (including critical care time)  Medications Ordered in UC Medications - No data to display   Initial Impression / Assessment and Plan / UC Course  I have reviewed the triage vital signs and the nursing notes.  Pertinent labs & imaging results that were available during my care of the patient were reviewed by me and considered in my medical decision making (see chart for details).    Discussed with patient history  and exam most consistent with viral URI. Discussed possible flu causing symptoms, tamiflu vs symptomatic treatment. Patient would like to proceed with symptomatic treatment. Push fluids. Return precautions given.    Final Clinical Impressions(s) / UC Diagnoses   Final diagnoses:  Influenza-like illness    ED Discharge Orders        Ordered    ipratropium (ATROVENT) 0.06 % nasal spray  4 times daily  10/28/17 2015    ondansetron (ZOFRAN ODT) 4 MG disintegrating tablet  Every 8 hours PRN     10/28/17 2015    meloxicam (MOBIC) 7.5 MG tablet  Daily     10/28/17 2015        Belinda Fisher, PA-C 10/28/17 2037

## 2017-10-28 NOTE — Discharge Instructions (Signed)
Zofran for nausea and vomiting as needed. Keep hydrated, you urine should be clear to pale yellow in color. Bland diet as attached, advance as tolerated. Start flonase, atrovent nasal spray for nasal congestion. You can use over the counter nasal saline rinse such as neti pot for nasal congestion. Keep hydrated, your urine should be clear to pale yellow in color. Tylenol for fever and pain. Mobic for body aches. Monitor for any worsening of symptoms, chest pain, shortness of breath, wheezing, swelling of the throat, follow up for reevaluation. Monitor for any worsening of symptoms, nausea or vomiting not controlled by medication, worsening abdominal pain, fever, follow-up for reevaluation.

## 2018-12-08 ENCOUNTER — Encounter (HOSPITAL_COMMUNITY): Payer: Self-pay

## 2018-12-08 ENCOUNTER — Ambulatory Visit (HOSPITAL_COMMUNITY)
Admission: EM | Admit: 2018-12-08 | Discharge: 2018-12-08 | Disposition: A | Discharge status: Home / Self Care | Payer: BLUE CROSS/BLUE SHIELD | Attending: Family Medicine | Admitting: Family Medicine

## 2018-12-08 ENCOUNTER — Other Ambulatory Visit: Payer: Self-pay

## 2018-12-08 DIAGNOSIS — B9789 Other viral agents as the cause of diseases classified elsewhere: Secondary | ICD-10-CM | POA: Diagnosis present

## 2018-12-08 DIAGNOSIS — J069 Acute upper respiratory infection, unspecified: Secondary | ICD-10-CM | POA: Insufficient documentation

## 2018-12-08 LAB — POCT RAPID STREP A: Streptococcus, Group A Screen (Direct): NEGATIVE

## 2018-12-08 MED ORDER — FLUTICASONE PROPIONATE 50 MCG/ACT NA SUSP: 2.0000 | Freq: Every day | NASAL | 0 refills | Status: DC

## 2018-12-08 MED ORDER — CETIRIZINE-PSEUDOEPHEDRINE ER 5-120 MG PO TB12: 1.0000 | ORAL_TABLET | Freq: Every day | ORAL | 0 refills | Status: DC

## 2018-12-08 MED ORDER — BENZONATATE 100 MG PO CAPS: 100.0000 mg | ORAL_CAPSULE | Freq: Three times a day (TID) | ORAL | 0 refills | Status: DC

## 2018-12-08 NOTE — Discharge Instructions (Signed)
Your rapid strep test was negative This is most likely a viral upper respiratory infection. Flonase nasal spray, Zyrtec D and Tessalon Perles for symptoms Medication sent to pharmacy.  Use as directed Follow up as needed for continued or worsening symptoms

## 2018-12-08 NOTE — ED Provider Notes (Signed)
[facility]    CSN: [id number] Arrival date & time: 12/08/18  0845     History   Chief Complaint Chief Complaint  Patient presents with  . Cough    HPI Connie White is a 23 y.o. female.    URI  Presenting symptoms: congestion, cough, rhinorrhea and sore throat   Severity:  Moderate Duration:  4 days Timing:  Constant Progression:  Waxing and waning Chronicity:  New Relieved by: ibuprofen somewhat helps with headache.  Worsened by:  Nothing Associated symptoms: headaches and sinus pain   Associated symptoms: no arthralgias, no myalgias, no neck pain, no sneezing, no swollen glands and no wheezing   Risk factors: no immunosuppression, no recent illness, no recent travel and no sick contacts     Past Medical History:  Diagnosis Date  . Gestational diabetes     Patient Active Problem List   Diagnosis Date Noted  . Nexplanon in place 03/11/2017  . Diabetes (HCC) 03/11/2017    Past Surgical History:  Procedure Laterality Date  . CHOLECYSTECTOMY N/A 10/22/2016   Procedure: LAPAROSCOPIC CHOLECYSTECTOMY;  Surgeon: Claud Kelp, MD;  Location: [facility];  Service: General;  Laterality: N/A;    OB History    Gravida  3   Para  2   Term  0   Preterm  2   AB  1   Living  2     SAB  1   TAB  0   Ectopic  0   Multiple  0   Live Births  2            Home Medications    Prior to Admission medications   Medication Sig Start Date End Date Taking? Authorizing Provider  benzonatate (TESSALON) 100 MG capsule Take 1 capsule (100 mg total) by mouth every 8 (eight) hours. 12/08/18   Milka Windholz, Gloris Manchester A, NP  cetirizine-pseudoephedrine (ZYRTEC-D) 5-120 MG tablet Take 1 tablet by mouth daily. 12/08/18   Steffan Caniglia, Gloris Manchester A, NP  fluticasone (FLONASE) 50 MCG/ACT nasal spray Place 2 sprays into both nostrils daily. 12/08/18   Janace Aris, NP  Prenatal Vit-Fe Phos-FA-Omega (VITAFOL GUMMIES) 3.33-0.333-34.8 MG CHEW Chew 3 tablets by mouth daily before breakfast.  06/12/16   Brock Bad, MD    Family History Family History  Problem Relation Age of Onset  . Diabetes Mother     Social History Social History   Tobacco Use  . Smoking status: Former Smoker    Last attempt to quit: 05/17/2016    Years since quitting: 2.5  . Smokeless tobacco: Never Used  Substance Use Topics  . Alcohol use: No  . Drug use: No     Allergies   Patient has no known allergies.   Review of Systems Review of Systems  HENT: Positive for congestion, rhinorrhea, sinus pain and sore throat. Negative for sneezing.   Respiratory: Positive for cough. Negative for wheezing.   Musculoskeletal: Negative for arthralgias, myalgias and neck pain.  Neurological: Positive for headaches.     Physical Exam Triage Vital Signs ED Triage Vitals  Enc Vitals Group     BP 12/08/18 0907 127/83     Pulse Rate 12/08/18 0907 71     Resp 12/08/18 0907 18     Temp 12/08/18 0907 98.3 F (36.8 C)     Temp src --      SpO2 12/08/18 0907 98 %     Weight 12/08/18 0905 143 lb (64.9 kg)  Height --      Head Circumference --      Peak Flow --      Pain Score 12/08/18 0905 3     Pain Loc --      Pain Edu? --      Excl. in [place]? --    No data found.  Updated Vital Signs BP 127/83 (BP Location: Right Arm)   Pulse 71   Temp 98.3 F (36.8 C)   Resp 18   Wt 143 lb (64.9 kg)   LMP 12/01/2018   SpO2 98%   BMI 33.24 kg/m   Visual Acuity Right Eye Distance:   Left Eye Distance:   Bilateral Distance:    Right Eye Near:   Left Eye Near:    Bilateral Near:     Physical Exam Vitals signs and nursing note reviewed.  Constitutional:      General: She is not in acute distress.    Appearance: Normal appearance. She is not ill-appearing, toxic-appearing or diaphoretic.  HENT:     Head: Normocephalic and atraumatic.     Right Ear: Tympanic membrane and ear canal normal.     Left Ear: Tympanic membrane and ear canal normal.     Nose: Congestion and rhinorrhea present.      Right Turbinates: Swollen.     Left Turbinates: Swollen.     Mouth/Throat:     Pharynx: Oropharynx is clear. Posterior oropharyngeal erythema present.  Eyes:     Conjunctiva/sclera: Conjunctivae normal.  Neck:     Musculoskeletal: Normal range of motion and neck supple. No neck rigidity or muscular tenderness.  Cardiovascular:     Rate and Rhythm: Normal rate and regular rhythm.     Pulses: Normal pulses.     Heart sounds: Normal heart sounds.  Pulmonary:     Effort: Pulmonary effort is normal.     Breath sounds: Normal breath sounds.  Musculoskeletal: Normal range of motion.  Lymphadenopathy:     Cervical: Cervical adenopathy present.  Skin:    General: Skin is warm and dry.  Neurological:     Mental Status: She is alert.  Psychiatric:        Mood and Affect: Mood normal.      UC Treatments / Results  Labs (all labs ordered are listed, but only abnormal results are displayed) Labs Reviewed  CULTURE, GROUP A STREP [facility])  POCT RAPID STREP A    EKG None  Radiology No results found.  Procedures Procedures (including critical care time)  Medications Ordered in UC Medications - No data to display  Initial Impression / Assessment and Plan / UC Course  I have reviewed the triage vital signs and the nursing notes.  Pertinent labs & imaging results that were available during my care of the patient were reviewed by me and considered in my medical decision making (see chart for details).     Strep test negative Symptoms consistent with viral URI Symptomatic treatment as needed Follow up as needed for continued or worsening symptoms   Final Clinical Impressions(s) / UC Diagnoses   Final diagnoses:  Viral URI with cough     Discharge Instructions     Your rapid strep test was negative This is most likely a viral upper respiratory infection. Flonase nasal spray, Zyrtec D and Tessalon Perles for symptoms Medication sent to pharmacy.  Use as directed  Follow up as needed for continued or worsening symptoms     ED Prescriptions    Medication  Sig Dispense Auth. Provider   cetirizine-pseudoephedrine (ZYRTEC-D) 5-120 MG tablet Take 1 tablet by mouth daily. 15 tablet Maciah Feeback A, NP   benzonatate (TESSALON) 100 MG capsule Take 1 capsule (100 mg total) by mouth every 8 (eight) hours. 21 capsule Donte Kary A, NP   fluticasone (FLONASE) 50 MCG/ACT nasal spray Place 2 sprays into both nostrils daily. 1 g Dahlia Byes A, NP     Controlled Substance Prescriptions [place] Controlled Substance Registry consulted? Not Applicable   Janace Aris, NP 12/08/18 1104

## 2018-12-08 NOTE — ED Triage Notes (Signed)
Travel none and Fever none. Pt cc coughing and a sore throat x 4 days. Pt has tried the ibuprofen 2 days ago. Pt needs a work note.

## 2018-12-10 LAB — CULTURE, GROUP A STREP (THRC)

## 2018-12-11 ENCOUNTER — Telehealth (HOSPITAL_COMMUNITY): Payer: Self-pay | Admitting: Emergency Medicine

## 2018-12-11 MED ORDER — PENICILLIN V POTASSIUM 500 MG PO TABS: 500.0000 mg | ORAL_TABLET | Freq: Two times a day (BID) | ORAL | 0 refills | Status: AC

## 2018-12-11 NOTE — Telephone Encounter (Signed)
Culture is positive for non group A Strep germ.  This is a finding of uncertain significance; not the typical 'strep throat' germ.  Pt complains of persistent symptoms.  Rx for penicillin V 500mg bid x 10d #20 no refills sent to pharmacy of patients choice. Pt verbalized understanding. Recheck for further evaluation if symptoms are not improving  Patient contacted and made aware of all results, all questions answered.    

## 2019-11-01 ENCOUNTER — Other Ambulatory Visit: Payer: Self-pay

## 2019-11-01 ENCOUNTER — Ambulatory Visit (INDEPENDENT_AMBULATORY_CARE_PROVIDER_SITE_OTHER): Payer: Managed Care, Other (non HMO)

## 2019-11-01 ENCOUNTER — Encounter (HOSPITAL_COMMUNITY): Payer: Self-pay

## 2019-11-01 ENCOUNTER — Ambulatory Visit (HOSPITAL_COMMUNITY)
Admission: EM | Admit: 2019-11-01 | Discharge: 2019-11-01 | Disposition: A | Discharge status: Home / Self Care | Payer: Managed Care, Other (non HMO) | Attending: Family Medicine | Admitting: Family Medicine

## 2019-11-01 DIAGNOSIS — W19XXXA Unspecified fall, initial encounter: Secondary | ICD-10-CM | POA: Diagnosis not present

## 2019-11-01 DIAGNOSIS — M25561 Pain in right knee: Secondary | ICD-10-CM

## 2019-11-01 DIAGNOSIS — X501XXA Overexertion from prolonged static or awkward postures, initial encounter: Secondary | ICD-10-CM | POA: Diagnosis not present

## 2019-11-01 MED ORDER — IBUPROFEN 800 MG PO TABS: 800.0000 mg | ORAL_TABLET | Freq: Three times a day (TID) | ORAL | 0 refills | Status: DC

## 2019-11-01 NOTE — ED Provider Notes (Signed)
[facility]    CSN: [id number] Arrival date & time: 11/01/19  0712      History   Chief Complaint Chief Complaint  Patient presents with  . Knee Pain    HPI Connie White is a 24 y.o. female presenting today for evaluation of right knee injury.  Yesterday morning patient twisted her knee getting out of the car and slipped and fell.  She landed on her bottom, but since has had pain and swelling in her right knee.  Denies prior injury to the knee.  Denies direct blow to the knee.  Since she has had a lot of pain especially with pivoting motions.  Denies sensation of popping or tearing at time of injury.  Denies sensation of instability since accident.  Pain will occasionally radiate into calf.  HPI  Past Medical History:  Diagnosis Date  . Gestational diabetes     Patient Active Problem List   Diagnosis Date Noted  . Nexplanon in place 03/11/2017  . Diabetes (HCC) 03/11/2017    Past Surgical History:  Procedure Laterality Date  . CHOLECYSTECTOMY N/A 10/22/2016   Procedure: LAPAROSCOPIC CHOLECYSTECTOMY;  Surgeon: Claud Kelp, MD;  Location: [facility];  Service: General;  Laterality: N/A;    OB History    Gravida  3   Para  2   Term  0   Preterm  2   AB  1   Living  2     SAB  1   TAB  0   Ectopic  0   Multiple  0   Live Births  2            Home Medications    Prior to Admission medications   Medication Sig Start Date End Date Taking? Authorizing Provider  benzonatate (TESSALON) 100 MG capsule Take 1 capsule (100 mg total) by mouth every 8 (eight) hours. 12/08/18   Bast, Gloris Manchester A, NP  cetirizine-pseudoephedrine (ZYRTEC-D) 5-120 MG tablet Take 1 tablet by mouth daily. 12/08/18   Bast, Gloris Manchester A, NP  fluticasone (FLONASE) 50 MCG/ACT nasal spray Place 2 sprays into both nostrils daily. 12/08/18   Dahlia Byes A, NP  ibuprofen (ADVIL) 800 MG tablet Take 1 tablet (800 mg total) by mouth 3 (three) times daily. 11/01/19   Clarice Bonaventure, Junius Creamer, PA-C    Prenatal Vit-Fe Phos-FA-Omega (VITAFOL GUMMIES) 3.33-0.333-34.8 MG CHEW Chew 3 tablets by mouth daily before breakfast. 06/12/16   Brock Bad, MD    Family History Family History  Problem Relation Age of Onset  . Diabetes Mother     Social History Social History   Tobacco Use  . Smoking status: Former Smoker    Quit date: 05/17/2016    Years since quitting: 3.4  . Smokeless tobacco: Never Used  Substance Use Topics  . Alcohol use: No  . Drug use: No     Allergies   Patient has no known allergies.   Review of Systems Review of Systems  Constitutional: Negative for fatigue and fever.  Eyes: Negative for visual disturbance.  Respiratory: Negative for shortness of breath.   Cardiovascular: Negative for chest pain.  Gastrointestinal: Negative for abdominal pain, nausea and vomiting.  Musculoskeletal: Positive for arthralgias, gait problem and joint swelling.  Skin: Negative for color change, rash and wound.  Neurological: Negative for dizziness, weakness, light-headedness and headaches.     Physical Exam Triage Vital Signs ED Triage Vitals  Enc Vitals Group     BP 11/01/19 0926 (!) 130/93  Pulse Rate 11/01/19 0926 83     Resp 11/01/19 0926 16     Temp 11/01/19 0926 99.1 F (37.3 C)     Temp Source 11/01/19 0926 Oral     SpO2 11/01/19 0926 100 %     Weight 11/01/19 0925 142 lb (64.4 kg)     Height --      Head Circumference --      Peak Flow --      Pain Score 11/01/19 0924 5     Pain Loc --      Pain Edu? --      Excl. in [place]? --    No data found.  Updated Vital Signs BP (!) 130/93 (BP Location: Right Arm)   Pulse 83   Temp 99.1 F (37.3 C) (Oral)   Resp 16   Wt 142 lb (64.4 kg)   SpO2 100%   BMI 33.00 kg/m   Visual Acuity Right Eye Distance:   Left Eye Distance:   Bilateral Distance:    Right Eye Near:   Left Eye Near:    Bilateral Near:     Physical Exam Vitals and nursing note reviewed.  Constitutional:      Appearance: She is  well-developed.     Comments: No acute distress  HENT:     Head: Normocephalic and atraumatic.     Nose: Nose normal.  Eyes:     Conjunctiva/sclera: Conjunctivae normal.  Cardiovascular:     Rate and Rhythm: Normal rate.  Pulmonary:     Effort: Pulmonary effort is normal. No respiratory distress.  Abdominal:     General: There is no distension.  Musculoskeletal:        General: Normal range of motion.     Cervical back: Neck supple.     Comments: Right knee: No overlying erythema or discoloration, swelling noted in suprapatellar area, patient avoiding full extension, but is able to achieve full extension, nontender to palpation over patella and infrapatellar area, tender to palpation to medial aspect of knee, nontender over lateral aspect, minimal tenderness in popliteal area, no significant calf tenderness No laxity appreciated with varus and valgus stress, negative Lachman's Ambulating with antalgia  Skin:    General: Skin is warm and dry.  Neurological:     Mental Status: She is alert and oriented to person, place, and time.      UC Treatments / Results  Labs (all labs ordered are listed, but only abnormal results are displayed) Labs Reviewed - No data to display  EKG   Radiology DG Knee Complete 4 Views Right  Result Date: 11/01/2019 CLINICAL DATA:  Twisted knee, pain EXAM: RIGHT KNEE - COMPLETE 4+ VIEW COMPARISON:  None. FINDINGS: Alignment is anatomic. No acute fracture. Joint effusion is present. Joint spaces are preserved. IMPRESSION: No acute fracture.  Joint effusion is present. Electronically Signed   By: Macy Mis M.D.   On: 11/01/2019 09:54    Procedures Procedures (including critical care time)  Medications Ordered in UC Medications - No data to display  Initial Impression / Assessment and Plan / UC Course  I have reviewed the triage vital signs and the nursing notes.  Pertinent labs & imaging results that were available during my care of the  patient were reviewed by me and considered in my medical decision making (see chart for details).     X-ray negative for acute bony abnormality.  Cannot rule out underlying ligamentous/soft tissue damage.  Given medial pain along with pain  with pivoting increased concern for possible underlying meniscal tear.  Placing in a hinged knee brace, weight-bear as tolerated, ice and elevate.  Recommending follow-up with sports medicine for further imaging/evaluation if symptoms persisting.  Providing work note for 2 days to rest ice and elevate.  Discussed strict return precautions. Patient verbalized understanding and is agreeable with plan.  Final Clinical Impressions(s) / UC Diagnoses   Final diagnoses:  Acute pain of right knee     Discharge Instructions     No fracture on x-ray Use anti-inflammatories for pain/swelling. You may take up to 800 mg Ibuprofen every 8 hours with food. You may supplement Ibuprofen with Tylenol [phone] mg every 8 hours.  Please ice and elevate your knee over the next couple days If you do not see any improvement in swelling and pain please follow-up with sports medicine-contact info below    ED Prescriptions    Medication Sig Dispense Auth. Provider   ibuprofen (ADVIL) 800 MG tablet Take 1 tablet (800 mg total) by mouth 3 (three) times daily. 21 tablet Quron Ruddy, [place] C, PA-C     PDMP not reviewed this encounter.   Lew Dawes, PA-C 11/01/19 1007

## 2019-11-01 NOTE — Discharge Instructions (Signed)
No fracture on x-ray Use anti-inflammatories for pain/swelling. You may take up to 800 mg Ibuprofen every 8 hours with food. You may supplement Ibuprofen with Tylenol [phone] mg every 8 hours.  Please ice and elevate your knee over the next couple days If you do not see any improvement in swelling and pain please follow-up with sports medicine-contact info below

## 2019-11-01 NOTE — ED Triage Notes (Signed)
Pt states she was getting out of the car and she twisted on her right knee.  This happened yesterday. Pt states its swollen and pain radiates down the back of her knee.

## 2020-02-11 ENCOUNTER — Ambulatory Visit: Payer: Managed Care, Other (non HMO) | Admitting: Obstetrics & Gynecology

## 2020-11-13 ENCOUNTER — Other Ambulatory Visit: Payer: Self-pay

## 2020-11-13 ENCOUNTER — Encounter (HOSPITAL_COMMUNITY): Payer: Self-pay | Admitting: Emergency Medicine

## 2020-11-13 ENCOUNTER — Ambulatory Visit (HOSPITAL_COMMUNITY)
Admission: EM | Admit: 2020-11-13 | Discharge: 2020-11-13 | Disposition: A | Discharge status: Home / Self Care | Payer: Managed Care, Other (non HMO) | Attending: Family Medicine | Admitting: Family Medicine

## 2020-11-13 DIAGNOSIS — R197 Diarrhea, unspecified: Secondary | ICD-10-CM | POA: Diagnosis present

## 2020-11-13 DIAGNOSIS — E119 Type 2 diabetes mellitus without complications: Secondary | ICD-10-CM | POA: Diagnosis present

## 2020-11-13 DIAGNOSIS — R1013 Epigastric pain: Secondary | ICD-10-CM | POA: Insufficient documentation

## 2020-11-13 DIAGNOSIS — R110 Nausea: Secondary | ICD-10-CM | POA: Insufficient documentation

## 2020-11-13 LAB — POCT URINALYSIS DIPSTICK, ED / UC
Bilirubin Urine: NEGATIVE
Glucose, UA: 100 mg/dL — AB
Hgb urine dipstick: NEGATIVE
Ketones, ur: 40 mg/dL — AB
Nitrite: NEGATIVE
Protein, ur: 30 mg/dL — AB
Specific Gravity, Urine: 1.025 (ref 1.005–1.030)
Urobilinogen, UA: 0.2 mg/dL (ref 0.0–1.0)
pH: 6 (ref 5.0–8.0)

## 2020-11-13 LAB — CBC WITH DIFFERENTIAL/PLATELET
Abs Immature Granulocytes: 0.08 10*3/uL — ABNORMAL HIGH (ref 0.00–0.07)
Basophils Absolute: 0 10*3/uL (ref 0.0–0.1)
Basophils Relative: 0 %
Eosinophils Absolute: 0.2 10*3/uL (ref 0.0–0.5)
Eosinophils Relative: 2 %
HCT: 47.2 % — ABNORMAL HIGH (ref 36.0–46.0)
Hemoglobin: 14.3 g/dL (ref 12.0–15.0)
Immature Granulocytes: 1 %
Lymphocytes Relative: 6 %
Lymphs Abs: 0.6 10*3/uL — ABNORMAL LOW (ref 0.7–4.0)
MCH: 22.4 pg — ABNORMAL LOW (ref 26.0–34.0)
MCHC: 30.3 g/dL (ref 30.0–36.0)
MCV: 74 fL — ABNORMAL LOW (ref 80.0–100.0)
Monocytes Absolute: 0.5 10*3/uL (ref 0.1–1.0)
Monocytes Relative: 5 %
Neutro Abs: 8.1 10*3/uL — ABNORMAL HIGH (ref 1.7–7.7)
Neutrophils Relative %: 86 %
Platelets: 343 10*3/uL (ref 150–400)
RBC: 6.38 MIL/uL — ABNORMAL HIGH (ref 3.87–5.11)
RDW: 13.3 % (ref 11.5–15.5)
WBC: 9.4 10*3/uL (ref 4.0–10.5)
nRBC: 0 % (ref 0.0–0.2)

## 2020-11-13 LAB — COMPREHENSIVE METABOLIC PANEL
ALT: 176 U/L — ABNORMAL HIGH (ref 0–44)
AST: 107 U/L — ABNORMAL HIGH (ref 15–41)
Albumin: 4 g/dL (ref 3.5–5.0)
Alkaline Phosphatase: 64 U/L (ref 38–126)
Anion gap: 10 (ref 5–15)
BUN: 9 mg/dL (ref 6–20)
CO2: 22 mmol/L (ref 22–32)
Calcium: 9.3 mg/dL (ref 8.9–10.3)
Chloride: 101 mmol/L (ref 98–111)
Creatinine, Ser: 0.58 mg/dL (ref 0.44–1.00)
GFR, Estimated: >60 mL/min (ref >60)
Glucose, Bld: 227 mg/dL — ABNORMAL HIGH (ref 70–99)
Potassium: 4 mmol/L (ref 3.5–5.1)
Sodium: 133 mmol/L — ABNORMAL LOW (ref 135–145)
Total Bilirubin: 1.2 mg/dL (ref 0.3–1.2)
Total Protein: 8 g/dL (ref 6.5–8.1)

## 2020-11-13 LAB — CBG MONITORING, ED: Glucose-Capillary: 232 mg/dL — ABNORMAL HIGH (ref 70–99)

## 2020-11-13 LAB — POC URINE PREG, ED: Preg Test, Ur: NEGATIVE

## 2020-11-13 LAB — HEMOGLOBIN A1C
Hgb A1c MFr Bld: 8.5 % — ABNORMAL HIGH (ref 4.8–5.6)
Mean Plasma Glucose: 197.25 mg/dL

## 2020-11-13 LAB — LIPASE, BLOOD: Lipase: 29 U/L (ref 11–51)

## 2020-11-13 MED ORDER — ONDANSETRON 4 MG PO TBDP: 4.0000 mg | ORAL_TABLET | Freq: Three times a day (TID) | ORAL | 0 refills | Status: DC | PRN

## 2020-11-13 MED ORDER — BLOOD GLUCOSE MONITOR KIT: PACK | 0 refills | Status: DC

## 2020-11-13 MED ORDER — METFORMIN HCL 500 MG PO TABS: 500.0000 mg | ORAL_TABLET | Freq: Two times a day (BID) | ORAL | 1 refills | Status: AC

## 2020-11-13 NOTE — ED Notes (Signed)
Pt unable to urinate, given water. 

## 2020-11-13 NOTE — ED Triage Notes (Signed)
Pt states that she is having diarrhea, back pain, upper abdominal pain, and nausea. Pt states that her sx started this morning.

## 2020-11-13 NOTE — Discharge Instructions (Signed)
Please do your best to ensure adequate fluid intake in order to avoid dehydration. If you find that you are unable to tolerate drinking fluids regularly please proceed to the Emergency Department for evaluation.  Also, you should return to the hospital if you experience fevers, increasing abdominal pain that persists despite medications, persistent diarrhea, dizziness, syncope (fainting), or for any other concerns you may deem worrisome.

## 2020-11-14 LAB — URINE CULTURE

## 2020-11-15 NOTE — ED Provider Notes (Signed)
[place]   [id number] 11/13/20 Arrival Time: 2094  ASSESSMENT & PLAN:  1. Epigastric pain   2. Nausea without vomiting   3. Diarrhea, unspecified type   4. New onset type 2 diabetes mellitus (HCC)    CBG 232. Overall benign abdominal exam. No indications for urgent abdominal/pelvic imaging at this time. Agrees to ED should pain worsen or if she develops new symptoms. She is familiar with diabetes; discussed new onset. Have Rx metformin; she will do her best to begin this with current nausea. Discussed importance of finding PCP.  Labs pending. Will inform her of any significant abnormalities.   Meds ordered this encounter  Medications  . ondansetron (ZOFRAN-ODT) 4 MG disintegrating tablet    Sig: Take 1 tablet (4 mg total) by mouth every 8 (eight) hours as needed for nausea or vomiting.    Dispense:  15 tablet    Refill:  0  . metFORMIN (GLUCOPHAGE) 500 MG tablet    Sig: Take 1 tablet (500 mg total) by mouth 2 (two) times daily with a meal.    Dispense:  60 tablet    Refill:  1  . blood glucose meter kit and supplies KIT    Sig: Dispense based on patient and insurance preference. Use up to four times daily as directed. (FOR ICD-9 250.00, 250.01).    Dispense:  1 each    Refill:  0    Order Specific Question:   Number of strips    Answer:   100    Order Specific Question:   Number of lancets    Answer:   100     Discharge Instructions     Please do your best to ensure adequate fluid intake in order to avoid dehydration. If you find that you are unable to tolerate drinking fluids regularly please proceed to the Emergency Department for evaluation.  Also, you should return to the hospital if you experience fevers, increasing abdominal pain that persists despite medications, persistent diarrhea, dizziness, syncope (fainting), or for any other concerns you may deem worrisome.     Follow-up Information    Schedule an appointment as soon as possible for a visit   with Caren Macadam, MD.   Specialty: Family Medicine Contact information: [place] [address] [phone]               Reviewed expectations re: course of current medical issues. Questions answered. Outlined signs and symptoms indicating need for more acute intervention. Patient verbalized understanding. After Visit Summary given.   SUBJECTIVE: History from: patient. Connie White is a 25 y.o. female who presents with complaint of intermittent diarrhea, nausea without emesis, and crampy upper abd pain. Questions feeling in R back also. All first noted this am upon waking. Afebrile. No urinary symptoms except for occasional frequency. No dysuria. Tolerating sips of fluids. Decreased appetite. No assoc CP/SOB.  Social History   Substance and Sexual Activity  Alcohol Use No    No LMP recorded. Patient has had an implant.   Past Surgical History:  Procedure Laterality Date  . CHOLECYSTECTOMY N/A 10/22/2016   Procedure: LAPAROSCOPIC CHOLECYSTECTOMY;  Surgeon: Fanny Skates, MD;  Location: [facility];  Service: General;  Laterality: N/A;     OBJECTIVE:  Vitals:   11/13/20 1207  BP: 135/87  Pulse: (!) 104  Resp: 18  Temp: 98.9 F (37.2 C)  TempSrc: Oral  SpO2: 98%      Recheck HR 94  General appearance: alert,  oriented, no acute distress HEENT: [place]; AT; oropharynx moist Lungs: unlabored respirations Abdomen: soft; without distention; mild  and poorly localized tenderness to palpation over mid to R upper abd; normal bowel sounds; without masses or organomegaly; without guarding or rebound tenderness Back: without reported CVA tenderness; FROM at waist Extremities: without LE edema; symmetrical; without gross deformities Skin: warm and dry Neurologic: normal gait Psychological: alert and cooperative; normal mood and affect  Labs: Results for orders placed or performed during the hospital encounter of 11/13/20  POC Urinalysis dipstick  Result  Value Ref Range   Glucose, UA 100 (A) NEGATIVE mg/dL   Bilirubin Urine NEGATIVE NEGATIVE   Ketones, ur 40 (A) NEGATIVE mg/dL   Specific Gravity, Urine 1.025 1.005 - 1.030   Hgb urine dipstick NEGATIVE NEGATIVE   pH 6.0 5.0 - 8.0   Protein, ur 30 (A) NEGATIVE mg/dL   Urobilinogen, UA 0.2 0.0 - 1.0 mg/dL   Nitrite NEGATIVE NEGATIVE   Leukocytes,Ua TRACE (A) NEGATIVE  POC urine pregnancy  Result Value Ref Range   Preg Test, Ur NEGATIVE NEGATIVE  POC CBG monitoring  Result Value Ref Range   Glucose-Capillary 232 (H) 70 - 99 mg/dL    No Known Allergies                                             Past Medical History:  Diagnosis Date  . Gestational diabetes     Social History   Socioeconomic History  . Marital status: Single    Spouse name: Not on file  . Number of children: Not on file  . Years of education: Not on file  . Highest education level: Not on file  Occupational History  . Not on file  Tobacco Use  . Smoking status: Former Smoker    Quit date: 05/17/2016    Years since quitting: 4.5  . Smokeless tobacco: Never Used  Substance and Sexual Activity  . Alcohol use: No  . Drug use: No  . Sexual activity: Not Currently    Partners: Male    Birth control/protection: None, Abstinence  Other Topics Concern  . Not on file  Social History Narrative  . Not on file   Social Determinants of Health   Financial Resource Strain: Not on file  Food Insecurity: Not on file  Transportation Needs: Not on file  Physical Activity: Not on file  Stress: Not on file  Social Connections: Not on file  Intimate Partner Violence: Not on file    Family History  Problem Relation Age of Onset  . Diabetes Mother      Vanessa Kick, MD 11/15/20 [phone]

## 2021-04-12 ENCOUNTER — Encounter (HOSPITAL_COMMUNITY): Payer: Self-pay

## 2021-04-12 ENCOUNTER — Other Ambulatory Visit: Payer: Self-pay

## 2021-04-12 ENCOUNTER — Ambulatory Visit (HOSPITAL_COMMUNITY)
Admission: EM | Admit: 2021-04-12 | Discharge: 2021-04-12 | Disposition: A | Discharge status: Home / Self Care | Payer: Managed Care, Other (non HMO) | Attending: Internal Medicine | Admitting: Internal Medicine

## 2021-04-12 DIAGNOSIS — Z20822 Contact with and (suspected) exposure to covid-19: Secondary | ICD-10-CM | POA: Diagnosis not present

## 2021-04-12 DIAGNOSIS — B349 Viral infection, unspecified: Secondary | ICD-10-CM | POA: Insufficient documentation

## 2021-04-12 LAB — POCT RAPID STREP A, ED / UC: Streptococcus, Group A Screen (Direct): NEGATIVE

## 2021-04-12 NOTE — ED Triage Notes (Signed)
Pt reports body aches, sore throat, fever, 1010 F right ear pain and headache x 1 day. Tylenol gives some relief.

## 2021-04-12 NOTE — ED Provider Notes (Signed)
[place]    CSN: [id number] Arrival date & time: 04/12/21  1449      History   Chief Complaint Chief Complaint  Patient presents with   Sore Throat   Fever   Headache   Generalized Body Aches    HPI Connie White is a 25 y.o. female.   Patient here for evaluation of body aches, sore throat, fever, bilateral ear pain, and headaches that started yesterday.  T-max 101, temperature 99.1 in office.  Reports taking Tylenol with some relief.  Denies any known sick contacts.  Denies any trauma, injury, or other precipitating event.  Denies any chest pain, shortness of breath, N/V/D, numbness, tingling, weakness, abdominal pain.     The history is provided by the patient.  Sore Throat Associated symptoms include headaches.  Fever Associated symptoms: congestion, ear pain, headaches, myalgias and sore throat   Headache Associated symptoms: congestion, ear pain, fever, myalgias and sore throat    Past Medical History:  Diagnosis Date   Gestational diabetes     Patient Active Problem List   Diagnosis Date Noted   Nexplanon in place 03/11/2017   Diabetes ([place]) 03/11/2017    Past Surgical History:  Procedure Laterality Date   CHOLECYSTECTOMY N/A 10/22/2016   Procedure: LAPAROSCOPIC CHOLECYSTECTOMY;  Surgeon: Fanny Skates, MD;  Location: [facility];  Service: General;  Laterality: N/A;    OB History     Gravida  3   Para  2   Term  0   Preterm  2   AB  1   Living  2      SAB  1   IAB  0   Ectopic  0   Multiple  0   Live Births  2            Home Medications    Prior to Admission medications   Medication Sig Start Date End Date Taking? Authorizing Provider  blood glucose meter kit and supplies KIT Dispense based on patient and insurance preference. Use up to four times daily as directed. (FOR ICD-9 250.00, 250.01). 11/13/20   Vanessa Kick, MD  metFORMIN (GLUCOPHAGE) 500 MG tablet Take 1 tablet (500 mg total) by mouth 2 (two) times daily  with a meal. 11/13/20   Vanessa Kick, MD  ondansetron (ZOFRAN-ODT) 4 MG disintegrating tablet Take 1 tablet (4 mg total) by mouth every 8 (eight) hours as needed for nausea or vomiting. 11/13/20   Vanessa Kick, MD  fluticasone (FLONASE) 50 MCG/ACT nasal spray Place 2 sprays into both nostrils daily. 12/08/18 11/13/20  Orvan July, NP    Family History Family History  Problem Relation Age of Onset   Diabetes Mother     Social History Social History   Tobacco Use   Smoking status: Former    Types: Cigarettes    Quit date: 05/17/2016    Years since quitting: 4.9   Smokeless tobacco: Never  Vaping Use   Vaping Use: Never used  Substance Use Topics   Alcohol use: No   Drug use: No     Allergies   Patient has no known allergies.   Review of Systems Review of Systems  Constitutional:  Positive for fever.  HENT:  Positive for congestion, ear pain and sore throat.   Musculoskeletal:  Positive for myalgias.  Neurological:  Positive for headaches.  All other systems reviewed and are negative.   Physical Exam Triage Vital Signs ED Triage Vitals  Enc Vitals Group  BP 04/12/21 1520 113/75     Pulse Rate 04/12/21 1520 91     Resp 04/12/21 1520 18     Temp 04/12/21 1520 99.1 F (37.3 C)     Temp Source 04/12/21 1520 Oral     SpO2 04/12/21 1520 100 %     Weight --      Height --      Head Circumference --      Peak Flow --      Pain Score 04/12/21 1517 6     Pain Loc --      Pain Edu? --      Excl. in [place]? --    No data found.  Updated Vital Signs BP 113/75 (BP Location: Right Arm)   Pulse 91   Temp 99.1 F (37.3 C) (Oral)   Resp 18   SpO2 100%   Visual Acuity Right Eye Distance:   Left Eye Distance:   Bilateral Distance:    Right Eye Near:   Left Eye Near:    Bilateral Near:     Physical Exam Vitals and nursing note reviewed.  Constitutional:      General: She is not in acute distress.    Appearance: Normal appearance. She is not ill-appearing,  toxic-appearing or diaphoretic.  HENT:     Head: Normocephalic and atraumatic.     Right Ear: Tympanic membrane and ear canal normal.     Left Ear: Tympanic membrane and ear canal normal.     Nose: Congestion present.     Mouth/Throat:     Pharynx: Uvula midline. Pharyngeal swelling and posterior oropharyngeal erythema present. No oropharyngeal exudate or uvula swelling.     Tonsils: No tonsillar exudate or tonsillar abscesses. 3+ on the right. 3+ on the left.  Eyes:     Conjunctiva/sclera: Conjunctivae normal.  Cardiovascular:     Rate and Rhythm: Normal rate and regular rhythm.     Pulses: Normal pulses.     Heart sounds: Normal heart sounds.  Pulmonary:     Effort: Pulmonary effort is normal.     Breath sounds: Normal breath sounds.  Abdominal:     General: Abdomen is flat.  Musculoskeletal:        General: Normal range of motion.     Cervical back: Normal range of motion.  Skin:    General: Skin is warm and dry.  Neurological:     General: No focal deficit present.     Mental Status: She is alert and oriented to person, place, and time.  Psychiatric:        Mood and Affect: Mood normal.     UC Treatments / Results  Labs (all labs ordered are listed, but only abnormal results are displayed) Labs Reviewed  CULTURE, GROUP A STREP ([place])  SARS CORONAVIRUS 2 (TAT 6-24 HRS)  POCT RAPID STREP A, ED / UC    EKG   Radiology No results found.  Procedures Procedures (including critical care time)  Medications Ordered in UC Medications - No data to display  Initial Impression / Assessment and Plan / UC Course  I have reviewed the triage vital signs and the nursing notes.  Pertinent labs & imaging results that were available during my care of the patient were reviewed by me and considered in my medical decision making (see chart for details).    Assessment negative for red flags or concerns.  Rapid strep negative, throat culture pending.  COVID test pending.   Discussed need to  quarantine while awaiting results and for at least 5 days if symptoms are positive.  Work note given to patient.  Discussed conservative symptom management as described in discharge instructions.  Encourage fluids and rest.  Follow-up as needed Final Clinical Impressions(s) / UC Diagnoses   Final diagnoses:  Viral illness     Discharge Instructions      We will contact you if your COVID test is positive.  Please quarantine while you wait for the results.  If your test is negative you may resume normal activities.  If your test is positive please continue to quarantine for at least 5 days from your symptom onset or until you are without a fever for at least 24 hours after the medications.  You can take Tylenol and/or Ibuprofen as needed for fever reduction and pain relief.   For cough: honey 1/2 to 1 teaspoon (you can dilute the honey in water or another fluid).  You can also use guaifenesin and dextromethorphan for cough. You can use a humidifier for chest congestion and cough.  If you don't have a humidifier, you can sit in the bathroom with the hot shower running.     For sore throat: try warm salt water gargles, cepacol lozenges, throat spray, warm tea or water with lemon/honey, popsicles or ice, or OTC cold relief medicine for throat discomfort.    For congestion: take a daily anti-histamine like Zyrtec, Claritin, and a oral decongestant, such as pseudoephedrine.  You can also use Flonase 1-2 sprays in each nostril daily.    It is important to stay hydrated: drink plenty of fluids (water, gatorade/powerade/pedialyte, juices, or teas) to keep your throat moisturized and help further relieve irritation/discomfort.   Return or go to the Emergency Department if symptoms worsen or do not improve in the next few days.      ED Prescriptions   None    PDMP not reviewed this encounter.   Pearson Forster, NP 04/12/21 1558

## 2021-04-12 NOTE — Discharge Instructions (Signed)

## 2021-04-13 LAB — SARS CORONAVIRUS 2 (TAT 6-24 HRS): SARS Coronavirus 2: NEGATIVE

## 2021-04-15 LAB — CULTURE, GROUP A STREP (THRC)

## 2022-04-12 ENCOUNTER — Emergency Department (HOSPITAL_COMMUNITY)
Admission: EM | Admit: 2022-04-12 | Discharge: 2022-04-13 | Disposition: A | Discharge status: Home / Self Care | Payer: Managed Care, Other (non HMO) | Attending: Emergency Medicine | Admitting: Emergency Medicine

## 2022-04-12 DIAGNOSIS — Y92411 Interstate highway as the place of occurrence of the external cause: Secondary | ICD-10-CM | POA: Insufficient documentation

## 2022-04-12 DIAGNOSIS — S46811A Strain of other muscles, fascia and tendons at shoulder and upper arm level, right arm, initial encounter: Secondary | ICD-10-CM

## 2022-04-12 DIAGNOSIS — S4991XA Unspecified injury of right shoulder and upper arm, initial encounter: Secondary | ICD-10-CM | POA: Diagnosis present

## 2022-04-13 ENCOUNTER — Emergency Department (HOSPITAL_COMMUNITY): Payer: Managed Care, Other (non HMO)

## 2022-04-13 ENCOUNTER — Other Ambulatory Visit: Payer: Self-pay

## 2022-04-13 ENCOUNTER — Encounter (HOSPITAL_COMMUNITY): Payer: Self-pay | Admitting: Emergency Medicine

## 2022-04-13 DIAGNOSIS — S46811A Strain of other muscles, fascia and tendons at shoulder and upper arm level, right arm, initial encounter: Secondary | ICD-10-CM | POA: Diagnosis not present

## 2022-04-13 MED ORDER — METHOCARBAMOL 500 MG PO TABS: 500.0000 mg | ORAL_TABLET | Freq: Two times a day (BID) | ORAL | 0 refills | Status: DC | PRN

## 2022-04-13 MED ORDER — KETOROLAC TROMETHAMINE 15 MG/ML IJ SOLN
15.0000 mg | Freq: Once | INTRAMUSCULAR | Status: AC
  Administered 2022-04-13: 15 mg via INTRAMUSCULAR
  Filled 2022-04-13: qty 1

## 2022-04-13 MED ORDER — METHOCARBAMOL 500 MG PO TABS
500.0000 mg | ORAL_TABLET | Freq: Once | ORAL | Status: AC
  Administered 2022-04-13: 500 mg via ORAL
  Filled 2022-04-13: qty 1

## 2022-04-13 MED ORDER — NAPROXEN 500 MG PO TABS: 500.0000 mg | ORAL_TABLET | Freq: Two times a day (BID) | ORAL | 0 refills | Status: DC
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
## 2022-04-13 NOTE — Discharge Instructions (Addendum)
Alternate ice and heat to areas of injury 3-4 times per day to limit inflammation and spasm.  Avoid strenuous activity and heavy lifting.  We recommend consistent use of naproxen in addition to Robaxin for muscle spasms. Do not drive or drink alcohol after taking Robaxin as it may make you drowsy and impair your judgment.  We recommend follow-up with a primary care doctor to ensure resolution of symptoms.  Return to the ED for any new or concerning symptoms. 
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
## 2022-05-18 ENCOUNTER — Encounter (HOSPITAL_COMMUNITY): Payer: Self-pay | Admitting: *Deleted

## 2022-05-18 ENCOUNTER — Other Ambulatory Visit: Payer: Self-pay

## 2022-05-18 ENCOUNTER — Ambulatory Visit (HOSPITAL_COMMUNITY)
Admission: EM | Admit: 2022-05-18 | Discharge: 2022-05-18 | Disposition: A | Discharge status: Home / Self Care | Payer: Managed Care, Other (non HMO) | Attending: Internal Medicine | Admitting: Internal Medicine

## 2022-05-18 DIAGNOSIS — Z1152 Encounter for screening for COVID-19: Secondary | ICD-10-CM | POA: Diagnosis not present

## 2022-05-18 DIAGNOSIS — H6593 Unspecified nonsuppurative otitis media, bilateral: Secondary | ICD-10-CM

## 2022-05-18 DIAGNOSIS — J029 Acute pharyngitis, unspecified: Secondary | ICD-10-CM | POA: Diagnosis not present

## 2022-05-18 LAB — POCT RAPID STREP A, ED / UC: Streptococcus, Group A Screen (Direct): NEGATIVE

## 2022-05-18 MED ORDER — IBUPROFEN 600 MG PO TABS: 600.0000 mg | ORAL_TABLET | Freq: Four times a day (QID) | ORAL | 0 refills | Status: DC | PRN

## 2022-05-18 MED ORDER — CETIRIZINE HCL 10 MG PO TABS: 10.0000 mg | ORAL_TABLET | Freq: Every day | ORAL | 2 refills | Status: DC

## 2022-05-18 MED ORDER — IBUPROFEN 800 MG PO TABS
800.0000 mg | ORAL_TABLET | Freq: Once | ORAL | Status: AC
  Administered 2022-05-18: 800 mg via ORAL

## 2022-05-18 MED ORDER — ACETAMINOPHEN 500 MG PO TABS: 1000.0000 mg | ORAL_TABLET | Freq: Four times a day (QID) | ORAL | 0 refills | Status: DC | PRN

## 2022-05-18 MED ORDER — IBUPROFEN 800 MG PO TABS
ORAL_TABLET | ORAL | Status: AC
  Filled 2022-05-18: qty 1

## 2022-05-18 NOTE — ED Provider Notes (Signed)
[place]    CSN: [id number] Arrival date & time: 05/18/22  1409      History   Chief Complaint Chief Complaint  Patient presents with   Otalgia   Sore Throat   Headache    HPI Connie White is a 26 y.o. female.   Patient presents to urgent care for evaluation of headache, sore throat on the right side, and ear pain on the right side that started suddenly last night. Patient states headache is currently a 7 on a scale of 0-10 and she states this feels "like a migraine". Patient denies history of migraine headaches. Headache is mainly on the right side of her head but does cross midline sometimes. She has not taken any medication for the headache or other symptoms prior to arrival at urgent care. She reports cold chills last night, but did not check her temperature. No known sick contacts. It hurts to swallow due to throat pain. Denies decreased hearing, but reports "shooting/aching" pain to the right ear. Denies chest pain, shortness of breath, cough, nausea, vomiting, diarrhea, abdominal pain, urinary symptoms, and dizziness. Denies changes in vision. She is not vaccinated against COVID-19 and is a former smoker. Denies drug use. Denies chronic respiratory problems.    Otalgia Associated symptoms: headaches   Sore Throat Associated symptoms include headaches.  Headache Associated symptoms: ear pain     Past Medical History:  Diagnosis Date   Gestational diabetes     Patient Active Problem List   Diagnosis Date Noted   Nexplanon in place 03/11/2017   Diabetes ([place]) 03/11/2017    Past Surgical History:  Procedure Laterality Date   CHOLECYSTECTOMY N/A 10/22/2016   Procedure: LAPAROSCOPIC CHOLECYSTECTOMY;  Surgeon: Fanny Skates, MD;  Location: [facility];  Service: General;  Laterality: N/A;    OB History     Gravida  3   Para  2   Term  0   Preterm  2   AB  1   Living  2      SAB  1   IAB  0   Ectopic  0   Multiple  0   Live Births  2             Home Medications    Prior to Admission medications   Medication Sig Start Date End Date Taking? Authorizing Provider  acetaminophen (TYLENOL) 500 MG tablet Take 2 tablets (1,000 mg total) by mouth every 6 (six) hours as needed. 05/18/22  Yes Talbot Grumbling, FNP  cetirizine (ZYRTEC) 10 MG tablet Take 1 tablet (10 mg total) by mouth daily. 05/18/22  Yes Talbot Grumbling, FNP  ibuprofen (ADVIL) 600 MG tablet Take 1 tablet (600 mg total) by mouth every 6 (six) hours as needed. 05/18/22  Yes Talbot Grumbling, FNP  blood glucose meter kit and supplies KIT Dispense based on patient and insurance preference. Use up to four times daily as directed. (FOR ICD-9 250.00, 250.01). 11/13/20   Vanessa Kick, MD  metFORMIN (GLUCOPHAGE) 500 MG tablet Take 1 tablet (500 mg total) by mouth 2 (two) times daily with a meal. 11/13/20   Vanessa Kick, MD  methocarbamol (ROBAXIN) 500 MG tablet Take 1 tablet (500 mg total) by mouth every 12 (twelve) hours as needed for muscle spasms. 04/13/22   Antonietta Breach, PA-C  naproxen (NAPROSYN) 500 MG tablet Take 1 tablet (500 mg total) by mouth 2 (two) times daily. 04/13/22   Antonietta Breach, PA-C  ondansetron (ZOFRAN-ODT) 4 MG  disintegrating tablet Take 1 tablet (4 mg total) by mouth every 8 (eight) hours as needed for nausea or vomiting. 11/13/20   Vanessa Kick, MD  fluticasone (FLONASE) 50 MCG/ACT nasal spray Place 2 sprays into both nostrils daily. 12/08/18 11/13/20  Orvan July, NP    Family History Family History  Problem Relation Age of Onset   Diabetes Mother     Social History Social History   Tobacco Use   Smoking status: Former    Types: Cigarettes    Quit date: 05/17/2016    Years since quitting: 6.0   Smokeless tobacco: Never  Vaping Use   Vaping Use: Never used  Substance Use Topics   Alcohol use: No   Drug use: No     Allergies   Patient has no known allergies.   Review of Systems Review of Systems  HENT:  Positive for ear  pain.   Neurological:  Positive for headaches.  Per HPI   Physical Exam Triage Vital Signs ED Triage Vitals  Enc Vitals Group     BP 05/18/22 1524 123/80     Pulse Rate 05/18/22 1524 100     Resp 05/18/22 1524 18     Temp 05/18/22 1524 99.9 F (37.7 C)     Temp src --      SpO2 05/18/22 1524 100 %     Weight --      Height --      Head Circumference --      Peak Flow --      Pain Score 05/18/22 1522 7     Pain Loc --      Pain Edu? --      Excl. in [place]? --    No data found.  Updated Vital Signs BP 123/80   Pulse 100   Temp 99.9 F (37.7 C)   Resp 18   LMP 03/16/2022   SpO2 100%   Visual Acuity Right Eye Distance:   Left Eye Distance:   Bilateral Distance:    Right Eye Near:   Left Eye Near:    Bilateral Near:     Physical Exam Vitals and nursing note reviewed.  Constitutional:      Appearance: Normal appearance. She is not ill-appearing or toxic-appearing.     Comments: Very pleasant patient sitting on exam in position of comfort table in no acute distress.   HENT:     Head: Normocephalic and atraumatic.     Right Ear: Hearing, ear canal and external ear normal. No drainage or tenderness. A middle ear effusion is present. Tympanic membrane is not erythematous.     Left Ear: Hearing, ear canal and external ear normal. No drainage or tenderness. A middle ear effusion is present. Tympanic membrane is not erythematous.     Nose: Nose normal. No congestion or rhinorrhea.     Mouth/Throat:     Lips: Pink.     Mouth: Mucous membranes are moist.     Pharynx: Posterior oropharyngeal erythema present.     Tonsils: Tonsillar exudate present.  Eyes:     General: Lids are normal. Vision grossly intact. Gaze aligned appropriately.     Extraocular Movements: Extraocular movements intact.     Conjunctiva/sclera: Conjunctivae normal.  Cardiovascular:     Rate and Rhythm: Normal rate and regular rhythm.     Heart sounds: Normal heart sounds, S1 normal and S2 normal.   Pulmonary:     Effort: Pulmonary effort is normal. No respiratory distress.  Breath sounds: Normal breath sounds and air entry.  Abdominal:     Palpations: Abdomen is soft.  Musculoskeletal:     Cervical back: Neck supple.  Lymphadenopathy:     Cervical: Cervical adenopathy present.  Skin:    General: Skin is warm and dry.     Capillary Refill: Capillary refill takes less than 2 seconds.     Findings: No rash.  Neurological:     General: No focal deficit present.     Mental Status: She is alert and oriented to person, place, and time. Mental status is at baseline.     Cranial Nerves: No dysarthria or facial asymmetry.     Motor: No weakness.     Gait: Gait is intact. Gait normal.  Psychiatric:        Mood and Affect: Mood normal.        Speech: Speech normal.        Behavior: Behavior normal.        Thought Content: Thought content normal.        Judgment: Judgment normal.      UC Treatments / Results  Labs (all labs ordered are listed, but only abnormal results are displayed) Labs Reviewed  SARS CORONAVIRUS 2 (TAT 6-24 HRS)  POCT RAPID STREP A, ED / UC    EKG   Radiology No results found.  Procedures Procedures (including critical care time)  Medications Ordered in UC Medications  ibuprofen (ADVIL) tablet 800 mg (800 mg Oral Given 05/18/22 1617)    Initial Impression / Assessment and Plan / UC Course  I have reviewed the triage vital signs and the nursing notes.  Pertinent labs & imaging results that were available during my care of the patient were reviewed by me and considered in my medical decision making (see chart for details).   1.  Viral pharyngitis Group A strep testing is negative.  Throat culture is pending.  COVID-19 testing is pending.  We will call patient with positive results.  Work note given.  Plan to treat this with prescriptions for symptomatic relief.  Patient to take cetirizine once daily for the next few days to relieve middle ear  effusions bilaterally.  Tylenol and ibuprofen may be used every 6 hours as needed for throat pain and headache.  Advised patient to take this medicine with food to avoid stomach upset.  First dose of ibuprofen given in clinic for headache, low-grade fever at 99.9, and throat pain.  Her next dose may be at 12 AM if needed.  Nonpharmacologic methods for sore throat remedies given in AVS.  Deferred imaging based on stable cardiopulmonary exam and hemodynamically stable vital signs.  Discussed physical exam and available lab work findings in clinic with patient.  Counseled patient regarding appropriate use of medications and potential side effects for all medications recommended or prescribed today. Discussed red flag signs and symptoms of worsening condition,when to call the PCP office, return to urgent care, and when to seek higher level of care in the emergency department. Patient verbalizes understanding and agreement with plan. All questions answered. Patient discharged in stable condition.  Final Clinical Impressions(s) / UC Diagnoses   Final diagnoses:  Viral pharyngitis  Encounter for screening for COVID-19  Fluid level behind tympanic membrane of both ears     Discharge Instructions      You have a viral upper respiratory infection.  COVID-19 testing is pending.  Your throat culture is also pending since your strep testing was negative in  the clinic.  We will call you if you need antibiotics based on results of throat culture and we will call you if your COVID test is positive.  Work note is at the end of your packet.  Take Tylenol and ibuprofen every 6 hours as needed for throat pain.  Your next dose of ibuprofen may be at 12 AM if needed tonight. Take ibuprofen with food to avoid stomach upset.  You may do salt water and baking soda gargles every 4 hours as needed for your throat pain.  Please put 1 teaspoon of salt and 1/2 teaspoon of baking soda in 8 ounces of warm water then gargle  and spit the water out. You may also put 1 tablespoon of honey in warm water and drink this to soothe your throat.  Place a humidifier in your room at night to help decrease dry air that can irritate your airway and cause you to have a sore throat and cough.  Please try to eat a well-balanced diet while you are sick so that your body gets proper nutrition to heal.  If you develop any new or worsening symptoms, please return.  If your symptoms are severe, please go to the emergency room.  Follow-up with your primary care provider for further evaluation and management of your symptoms as well as ongoing wellness visits.  I hope you feel better!      ED Prescriptions     Medication Sig Dispense Auth. Provider   ibuprofen (ADVIL) 600 MG tablet Take 1 tablet (600 mg total) by mouth every 6 (six) hours as needed. 30 tablet Joella Prince M, FNP   acetaminophen (TYLENOL) 500 MG tablet Take 2 tablets (1,000 mg total) by mouth every 6 (six) hours as needed. 30 tablet Talbot Grumbling, FNP   cetirizine (ZYRTEC) 10 MG tablet Take 1 tablet (10 mg total) by mouth daily. 30 tablet Talbot Grumbling, FNP      PDMP not reviewed this encounter.   Talbot Grumbling, [place] 05/18/22 [phone]

## 2022-05-18 NOTE — ED Triage Notes (Signed)
Pt reports ear pain,sore throat, and Ha started last night.

## 2022-05-18 NOTE — Discharge Instructions (Signed)
You have a viral upper respiratory infection.  COVID-19 testing is pending.  Your throat culture is also pending since your strep testing was negative in the clinic.  We will call you if you need antibiotics based on results of throat culture and we will call you if your COVID test is positive.  Work note is at the end of your packet.  Take Tylenol and ibuprofen every 6 hours as needed for throat pain.  Your next dose of ibuprofen may be at 12 AM if needed tonight. Take ibuprofen with food to avoid stomach upset.  You may do salt water and baking soda gargles every 4 hours as needed for your throat pain.  Please put 1 teaspoon of salt and 1/2 teaspoon of baking soda in 8 ounces of warm water then gargle and spit the water out. You may also put 1 tablespoon of honey in warm water and drink this to soothe your throat.  Place a humidifier in your room at night to help decrease dry air that can irritate your airway and cause you to have a sore throat and cough.  Please try to eat a well-balanced diet while you are sick so that your body gets proper nutrition to heal.  If you develop any new or worsening symptoms, please return.  If your symptoms are severe, please go to the emergency room.  Follow-up with your primary care provider for further evaluation and management of your symptoms as well as ongoing wellness visits.  I hope you feel better!

## 2022-05-19 LAB — SARS CORONAVIRUS 2 (TAT 6-24 HRS): SARS Coronavirus 2: NEGATIVE

## 2022-11-24 ENCOUNTER — Ambulatory Visit (HOSPITAL_COMMUNITY)
Admission: EM | Admit: 2022-11-24 | Discharge: 2022-11-24 | Disposition: A | Discharge status: Home / Self Care | Payer: Managed Care, Other (non HMO) | Attending: Emergency Medicine | Admitting: Emergency Medicine

## 2022-11-24 ENCOUNTER — Encounter (HOSPITAL_COMMUNITY): Payer: Self-pay

## 2022-11-24 DIAGNOSIS — H65193 Other acute nonsuppurative otitis media, bilateral: Secondary | ICD-10-CM | POA: Diagnosis present

## 2022-11-24 DIAGNOSIS — J029 Acute pharyngitis, unspecified: Secondary | ICD-10-CM

## 2022-11-24 LAB — POCT RAPID STREP A, ED / UC: Streptococcus, Group A Screen (Direct): NEGATIVE

## 2022-11-24 MED ORDER — ACETAMINOPHEN 325 MG PO TABS
ORAL_TABLET | ORAL | Status: AC
  Filled 2022-11-24: qty 2

## 2022-11-24 MED ORDER — AMOXICILLIN 500 MG PO CAPS: 500.0000 mg | ORAL_CAPSULE | Freq: Two times a day (BID) | ORAL | 0 refills | Status: DC

## 2022-11-24 MED ORDER — ACETAMINOPHEN 325 MG PO TABS
650.0000 mg | ORAL_TABLET | Freq: Once | ORAL | Status: AC
  Administered 2022-11-24: 650 mg via ORAL

## 2022-11-24 NOTE — ED Triage Notes (Signed)
Started with left ear hurting. Then sinus congestion/pressure. Leading to body aches and sorethroat. No fever. No exposure to covid19. Son had Flu A recently.

## 2022-11-24 NOTE — ED Provider Notes (Signed)
[place]    CSN: [id number] Arrival date & time: 11/24/22  1004     History   Chief Complaint Chief Complaint  Patient presents with   Ear Fullness    Left.    HPI Connie White is a 27 y.o. female.  2 day history of ear pain, body aches, congestion, sore throat, cough. First fever today. No GI symptoms.  Took nyquil last night that helped Son had the flu last week  Past Medical History:  Diagnosis Date   Gestational diabetes     Patient Active Problem List   Diagnosis Date Noted   Nexplanon in place 03/11/2017   Diabetes ([place]) 03/11/2017    Past Surgical History:  Procedure Laterality Date   CHOLECYSTECTOMY N/A 10/22/2016   Procedure: LAPAROSCOPIC CHOLECYSTECTOMY;  Surgeon: Fanny Skates, MD;  Location: [facility];  Service: General;  Laterality: N/A;    OB History     Gravida  3   Para  2   Term  0   Preterm  2   AB  1   Living  2      SAB  1   IAB  0   Ectopic  0   Multiple  0   Live Births  2            Home Medications    Prior to Admission medications   Medication Sig Start Date End Date Taking? Authorizing Provider  acetaminophen (TYLENOL) 500 MG tablet Take 2 tablets (1,000 mg total) by mouth every 6 (six) hours as needed. 05/18/22  Yes Talbot Grumbling, FNP  amoxicillin (AMOXIL) 500 MG capsule Take 1 capsule (500 mg total) by mouth 2 (two) times daily for 5 days. 11/24/22 11/29/22 Yes Lolitha Tortora, PA-C  blood glucose meter kit and supplies KIT Dispense based on patient and insurance preference. Use up to four times daily as directed. (FOR ICD-9 250.00, 250.01). 11/13/20  Yes Hagler, Aaron Edelman, MD  metFORMIN (GLUCOPHAGE) 500 MG tablet Take 1 tablet (500 mg total) by mouth 2 (two) times daily with a meal. 11/13/20  Yes Vanessa Kick, MD  Pseudoeph-Doxylamine-DM-APAP (NYQUIL PO) Take by mouth.   Yes [provider]  cetirizine (ZYRTEC) 10 MG tablet Take 1 tablet (10 mg total) by mouth daily. 05/18/22   Talbot Grumbling, FNP  fluticasone (FLONASE) 50 MCG/ACT nasal spray Place 2 sprays into both nostrils daily. 12/08/18 11/13/20  Orvan July, NP    Family History Family History  Problem Relation Age of Onset   Diabetes Mother     Social History Social History   Tobacco Use   Smoking status: Former    Types: Cigarettes    Quit date: 05/17/2016    Years since quitting: 6.5   Smokeless tobacco: Never  Vaping Use   Vaping Use: Never used  Substance Use Topics   Alcohol use: No   Drug use: No     Allergies   Patient has no known allergies.   Review of Systems Review of Systems As per HPI  Physical Exam Triage Vital Signs ED Triage Vitals  Enc Vitals Group     BP 11/24/22 1026 130/86     Pulse Rate 11/24/22 1026 (!) 112     Resp 11/24/22 1026 18     Temp 11/24/22 1026 (!) 100.4 F (38 C)     Temp Source 11/24/22 1026 Oral     SpO2 11/24/22 1026 97 %     Weight 11/24/22 1021 141  lb (64 kg)     Height 11/24/22 1021 '4\' 8"'$  (1.422 m)     Head Circumference --      Peak Flow --      Pain Score 11/24/22 1021 0     Pain Loc --      Pain Edu? --      Excl. in [place]? --    No data found.  Updated Vital Signs BP 130/86 (BP Location: Left Arm)   Pulse (!) 110   Temp (!) 100.4 F (38 C) (Oral)   Resp 18   Ht '4\' 8"'$  (1.422 m)   Wt 141 lb (64 kg)   LMP 11/20/2022 (Exact Date)   SpO2 97%   BMI 31.61 kg/m   Physical Exam Vitals and nursing note reviewed.  Constitutional:      General: She is not in acute distress. HENT:     Right Ear: A middle ear effusion is present. Tympanic membrane is bulging.     Left Ear: A middle ear effusion is present.     Ears:     Comments: Cloudy fluid behind bilat TM, R > L    Nose: No rhinorrhea.     Mouth/Throat:     Mouth: Mucous membranes are moist.     Pharynx: Oropharynx is clear. Posterior oropharyngeal erythema present.     Tonsils: No tonsillar exudate or tonsillar abscesses. 2+ on the right. 2+ on the left.  Eyes:      Conjunctiva/sclera: Conjunctivae normal.  Cardiovascular:     Rate and Rhythm: Normal rate and regular rhythm.     Pulses: Normal pulses.     Heart sounds: Normal heart sounds.  Pulmonary:     Effort: Pulmonary effort is normal.     Breath sounds: Normal breath sounds.  Abdominal:     Tenderness: There is no abdominal tenderness.  Musculoskeletal:     Cervical back: Normal range of motion.  Lymphadenopathy:     Cervical: No cervical adenopathy.  Skin:    General: Skin is warm and dry.  Neurological:     Mental Status: She is alert and oriented to person, place, and time.      UC Treatments / Results  Labs (all labs ordered are listed, but only abnormal results are displayed) Labs Reviewed  CULTURE, GROUP A STREP [facility])  POCT RAPID STREP A, ED / UC    EKG   Radiology No results found.  Procedures Procedures (including critical care time)  Medications Ordered in UC Medications  acetaminophen (TYLENOL) tablet 650 mg (650 mg Oral Given 11/24/22 1032)    Initial Impression / Assessment and Plan / UC Course  I have reviewed the triage vital signs and the nursing notes.  Pertinent labs & imaging results that were available during my care of the patient were reviewed by me and considered in my medical decision making (see chart for details).  Tylenol dose given in clinic. Low grade fever. Strep test negative, culture pending.  AOM, amox BID x 5 days Other symptomatic care at home. Return precautions discussed. Patient agrees to plan  Final Clinical Impressions(s) / UC Diagnoses   Final diagnoses:  Other non-recurrent acute nonsuppurative otitis media of both ears     Discharge Instructions      Negative strep test. For your ear infection, please take medication as prescribed. Take with food to avoid upset stomach. Drink lots of fluids!  Continue Tylenol or ibuprofen for fever and pain Honey is good for cough. You  can continue nyquil as well     ED  Prescriptions     Medication Sig Dispense Auth. Provider   amoxicillin (AMOXIL) 500 MG capsule Take 1 capsule (500 mg total) by mouth 2 (two) times daily for 5 days. 10 capsule Tyeasha Ebbs, [place] Guiles, PA-C      PDMP not reviewed this encounter.   Les Pou, [place] 11/24/22 [id number]

## 2022-11-24 NOTE — Discharge Instructions (Addendum)
Negative strep test. For your ear infection, please take medication as prescribed. Take with food to avoid upset stomach. Drink lots of fluids!  Continue Tylenol or ibuprofen for fever and pain Honey is good for cough. You can continue nyquil as well

## 2022-11-26 ENCOUNTER — Telehealth (HOSPITAL_COMMUNITY): Payer: Self-pay | Admitting: Emergency Medicine

## 2022-11-26 LAB — CULTURE, GROUP A STREP (THRC)

## 2022-11-26 MED ORDER — AMOXICILLIN 500 MG PO CAPS: 500.0000 mg | ORAL_CAPSULE | Freq: Two times a day (BID) | ORAL | 0 refills | Status: AC

## 2022-12-17 ENCOUNTER — Ambulatory Visit (HOSPITAL_COMMUNITY)
Admission: EM | Admit: 2022-12-17 | Discharge: 2022-12-17 | Disposition: A | Discharge status: Home / Self Care | Payer: Managed Care, Other (non HMO)

## 2022-12-17 ENCOUNTER — Encounter (HOSPITAL_COMMUNITY): Payer: Self-pay | Admitting: *Deleted

## 2022-12-17 DIAGNOSIS — J020 Streptococcal pharyngitis: Secondary | ICD-10-CM | POA: Diagnosis not present

## 2022-12-17 LAB — POCT RAPID STREP A, ED / UC: Streptococcus, Group A Screen (Direct): POSITIVE — AB

## 2022-12-17 MED ORDER — ACETAMINOPHEN 325 MG PO TABS
ORAL_TABLET | ORAL | Status: AC
  Filled 2022-12-17: qty 3

## 2022-12-17 MED ORDER — CEFDINIR 300 MG PO CAPS: 300.0000 mg | ORAL_CAPSULE | Freq: Two times a day (BID) | ORAL | 0 refills | Status: AC

## 2022-12-17 MED ORDER — ACETAMINOPHEN 325 MG PO TABS
975.0000 mg | ORAL_TABLET | Freq: Once | ORAL | Status: AC
  Administered 2022-12-17: 975 mg via ORAL

## 2022-12-17 NOTE — ED Provider Notes (Signed)
[facility]    CSN: [id number] Arrival date & time: 12/17/22  [id number]     History   Chief Complaint Chief Complaint  Patient presents with   Nasal Congestion   Sore Throat   Generalized Body Aches   Otalgia    HPI Connie White is a 27 y.o. female.  Yesterday developed sore throat, nasal congestion, body aches, ear pain No fevers No GI symptoms, tolerating fluids Has not taken any meds  Was seen by this provider 3/10, negative rapid strep, culture with some bacteria.  She was treated with 10 days of amoxicillin.  She finished the full course and changed her toothbrush around day 4-5. Symptoms had resolved until yesterday  Past Medical History:  Diagnosis Date   Gestational diabetes     Patient Active Problem List   Diagnosis Date Noted   Nexplanon in place 03/11/2017   Diabetes 03/11/2017    Past Surgical History:  Procedure Laterality Date   CHOLECYSTECTOMY N/A 10/22/2016   Procedure: LAPAROSCOPIC CHOLECYSTECTOMY;  Surgeon: Fanny Skates, MD;  Location: [facility];  Service: General;  Laterality: N/A;    OB History     Gravida  3   Para  2   Term  0   Preterm  2   AB  1   Living  2      SAB  1   IAB  0   Ectopic  0   Multiple  0   Live Births  2            Home Medications    Prior to Admission medications   Medication Sig Start Date End Date Taking? Authorizing Provider  cefdinir (OMNICEF) 300 MG capsule Take 1 capsule (300 mg total) by mouth 2 (two) times daily for 10 days. 12/17/22 12/27/22 Yes Avarie Tavano, Wells Guiles, PA-C  etonogestrel (NEXPLANON) 68 MG IMPL implant 1 each by Subdermal route once.   Yes [provider]  metFORMIN (GLUCOPHAGE) 500 MG tablet Take 1 tablet (500 mg total) by mouth 2 (two) times daily with a meal. 11/13/20   Vanessa Kick, MD  fluticasone (FLONASE) 50 MCG/ACT nasal spray Place 2 sprays into both nostrils daily. 12/08/18 11/13/20  Orvan July, NP    Family History Family History  Problem Relation  Age of Onset   Diabetes Mother     Social History Social History   Tobacco Use   Smoking status: Former    Types: Cigarettes    Quit date: 05/17/2016    Years since quitting: 6.5   Smokeless tobacco: Never  Vaping Use   Vaping Use: Never used  Substance Use Topics   Alcohol use: No   Drug use: No     Allergies   Patient has no known allergies.   Review of Systems Review of Systems As per HPI  Physical Exam Triage Vital Signs ED Triage Vitals  Enc Vitals Group     BP 12/17/22 0938 114/82     Pulse Rate 12/17/22 0938 (!) 101     Resp 12/17/22 0938 18     Temp 12/17/22 0938 98.6 F (37 C)     Temp Source 12/17/22 0938 Oral     SpO2 12/17/22 0938 96 %     Weight --      Height --      Head Circumference --      Peak Flow --      Pain Score 12/17/22 0937 0     Pain Loc --  Pain Edu? --      Excl. in [place]? --    No data found.  Updated Vital Signs BP 114/82 (BP Location: Right Arm)   Pulse (!) 101   Temp 98.6 F (37 C) (Oral)   Resp 18   LMP 11/20/2022 (Exact Date)   SpO2 96%    Physical Exam Vitals and nursing note reviewed.  Constitutional:      General: She is not in acute distress. HENT:     Right Ear: Tympanic membrane and ear canal normal.     Left Ear: Tympanic membrane and ear canal normal.     Nose: No congestion or rhinorrhea.     Mouth/Throat:     Mouth: Mucous membranes are moist.     Palate: Lesions present.     Pharynx: Oropharynx is clear. Posterior oropharyngeal erythema present.     Tonsils: No tonsillar exudate or tonsillar abscesses. 3+ on the right. 3+ on the left.     Comments: Scattered palatal petechiae Eyes:     Conjunctiva/sclera: Conjunctivae normal.  Cardiovascular:     Rate and Rhythm: Normal rate and regular rhythm.     Pulses: Normal pulses.     Heart sounds: Normal heart sounds.  Pulmonary:     Effort: Pulmonary effort is normal.     Breath sounds: Normal breath sounds.  Musculoskeletal:     Cervical back:  Normal range of motion.  Lymphadenopathy:     Cervical: No cervical adenopathy.  Skin:    General: Skin is warm and dry.  Neurological:     Mental Status: She is alert and oriented to person, place, and time.     UC Treatments / Results  Labs (all labs ordered are listed, but only abnormal results are displayed) Labs Reviewed  POCT RAPID STREP A, ED / UC - Abnormal; Notable for the following components:      Result Value   Streptococcus, Group A Screen (Direct) POSITIVE (*)    All other components within normal limits    EKG  Radiology No results found.  Procedures Procedures (including critical care time)  Medications Ordered in UC Medications  acetaminophen (TYLENOL) tablet 975 mg (975 mg Oral Given 12/17/22 1011)    Initial Impression / Assessment and Plan / UC Course  I have reviewed the triage vital signs and the nursing notes.  Pertinent labs & imaging results that were available during my care of the patient were reviewed by me and considered in my medical decision making (see chart for details).  Tylenol dose given for pain Strep test resulted positive.  She did finish the full course of amoxicillin and changed her toothbrush.  Question if she has a little resistance Will treat with Omnicef twice daily for 10 days.  Discussed again finishing full course of medicine and change out her toothbrush Other symptomatic care in the meantime Discussed return precautions, patient agrees to plan  Final Clinical Impressions(s) / UC Diagnoses   Final diagnoses:  Strep pharyngitis     Discharge Instructions      Your strep test was positive again. Please take the medication for the full 10 days. It's important to finish all 10 days even when you are feeling better. You should not have any pills left over.  Make sure to change your toothbrush after 1-2 days on the medicine. I would change toothbrush again on day 5 of the medicine.  Please change your pillowcase and  sheets.      ED Prescriptions  Medication Sig Dispense Auth. Provider   cefdinir (OMNICEF) 300 MG capsule Take 1 capsule (300 mg total) by mouth 2 (two) times daily for 10 days. 20 capsule Illias Pantano, Wells Guiles, PA-C      PDMP not reviewed this encounter.   Danialle Dement, Vernice Jefferson 12/17/22 1214

## 2022-12-17 NOTE — ED Triage Notes (Addendum)
Pt states she started having sore throat, congestion, cough, body aches, ear pain and pain shooting from right should down whole side of body yesterday. She hasn't taken any meds.

## 2022-12-17 NOTE — Discharge Instructions (Addendum)
Your strep test was positive again. Please take the medication for the full 10 days. It's important to finish all 10 days even when you are feeling better. You should not have any pills left over.  Make sure to change your toothbrush after 1-2 days on the medicine. I would change toothbrush again on day 5 of the medicine.  Please change your pillowcase and sheets.

## 2023-03-19 ENCOUNTER — Other Ambulatory Visit: Payer: Self-pay | Admitting: Nurse Practitioner

## 2023-03-19 DIAGNOSIS — N644 Mastodynia: Secondary | ICD-10-CM

## 2023-04-01 ENCOUNTER — Other Ambulatory Visit: Payer: Managed Care, Other (non HMO)

## 2023-04-09 ENCOUNTER — Ambulatory Visit
Admission: RE | Admit: 2023-04-09 | Discharge: 2023-04-09 | Disposition: A | Discharge status: Home / Self Care | Payer: Managed Care, Other (non HMO) | Source: Ambulatory Visit | Attending: Nurse Practitioner | Admitting: Nurse Practitioner

## 2023-04-09 ENCOUNTER — Other Ambulatory Visit: Payer: Self-pay | Admitting: Nurse Practitioner

## 2023-04-09 DIAGNOSIS — N644 Mastodynia: Secondary | ICD-10-CM

## 2024-04-16 ENCOUNTER — Encounter: Payer: Self-pay | Admitting: Neurology
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
## 2024-07-01 ENCOUNTER — Ambulatory Visit: Admitting: Neurology

## 2024-07-01 ENCOUNTER — Encounter: Payer: Self-pay | Admitting: Neurology

## 2024-07-01 VITALS — BP 112/72 | HR 88 | Ht 59.0 in | Wt 137.0 lb

## 2024-07-01 DIAGNOSIS — M542 Cervicalgia: Secondary | ICD-10-CM

## 2024-07-01 DIAGNOSIS — G43709 Chronic migraine without aura, not intractable, without status migrainosus: Secondary | ICD-10-CM

## 2024-07-01 DIAGNOSIS — G5601 Carpal tunnel syndrome, right upper limb: Secondary | ICD-10-CM

## 2024-07-01 MED ORDER — METHYLPREDNISOLONE 4 MG PO TBPK: ORAL_TABLET | ORAL | 0 refills | Status: DC

## 2024-07-01 MED ORDER — NORTRIPTYLINE HCL 10 MG PO CAPS: 20.0000 mg | ORAL_CAPSULE | Freq: Every day | ORAL | 5 refills | Status: DC

## 2024-07-01 MED ORDER — SUMATRIPTAN SUCCINATE 100 MG PO TABS: 100.0000 mg | ORAL_TABLET | Freq: Once | ORAL | 5 refills | Status: DC | PRN

## 2024-07-01 NOTE — Patient Instructions (Addendum)
 I saw you today for headaches and right hand pain.   The headaches sound like migraines and have contributions from your tight neck.  I would like to do another round of physical therapy for your neck.  For your headaches: Migraine prevention:  Start nortriptyline 10 mg at bedtime for 1 week, then 20 mg at bedtime thereafter Migraine rescue:  Start sumatriptan 100 mg as needed at headache onset, can repeat in 2 hours if needed Limit use of pain relievers to no more than 2 days out of week to prevent risk of rebound or medication-overuse headache. Keep headache diary Will prescribe medrol dose pack to break current headache cycle. Take as prescribed on the box.  Your right hand pain is likely carpal tunnel syndrome. We agreed to do electrical testing of the nerves, called EMG, to be sure this is the cause of your symptoms and not something else.  Cock up wrist splint for carpal tunnel symptoms can be bought in local drug stores or online. This should especially be worn at night while sleeping.     I will see you again in clinic in about 3 months. Please let me know if you have any questions or concerns in the meantime.   The physicians and staff at [facility] are committed to providing excellent care. You may receive a survey requesting feedback about your experience at our office. We strive to receive very good responses to the survey questions. If you feel that your experience would prevent you from giving the office a very good  response, please contact our office to try to remedy the situation. We may be reached at [phone]. Thank you for taking the time out of your busy day to complete the survey.  Venetia Potters, MD [facility]  More migraine information: Be aware of common food triggers:  - Caffeine :  coffee, black tea, cola, Mt. Dew  - Chocolate  - Dairy:  aged cheeses (brie, blue, cheddar, gouda, Parmasan, provolone, romano, Swiss, etc), chocolate milk,  buttermilk, sour cream, limit eggs and yogurt  - Nuts, peanut butter  - Alcohol  - Cereals/grains:  FRESH breads (fresh bagels, sourdough, doughnuts), yeast productions  - Processed/canned/aged/cured meats (pre-packaged deli meats, hotdogs)  - MSG/glutamate:  soy sauce, flavor enhancer, pickled/preserved/marinated foods  - Sweeteners:  aspartame (Equal, Nutrasweet).  Sugar and Splenda are okay  - Vegetables:  legumes (lima beans, lentils, snow peas, fava beans, pinto peans, peas, garbanzo beans), sauerkraut, onions, olives, pickles  - Fruit:  avocados, bananas, citrus fruit (orange, lemon, grapefruit), mango  - Other:  Frozen meals, macaroni and cheese Routine exercise Stay adequately hydrated (aim for 64 oz water daily) Keep headache diary Maintain proper stress management Maintain proper sleep hygiene Do not skip meals Consider supplements:  magnesium  citrate 400mg  daily, riboflavin 400mg  daily, coenzyme Q10 100mg  three times daily.   ELECTROMYOGRAM AND NERVE CONDUCTION STUDIES (EMG/NCS) INSTRUCTIONS  How to Prepare The neurologist conducting the EMG will need to know if you have certain medical conditions. Tell the neurologist and other EMG lab personnel if you: Have a pacemaker or any other electrical medical device Take blood-thinning medications Have hemophilia, a blood-clotting disorder that causes prolonged bleeding Bathing Take a shower or bath shortly before your exam in order to remove oils from your skin. Don't apply lotions or creams before the exam.  What to Expect You'll likely be asked to change into a hospital gown for the procedure and lie down on an examination table. The following explanations  can help you understand what will happen during the exam.  Electrodes. The neurologist or a technician places surface electrodes at various locations on your skin depending on where you're experiencing symptoms. Or the neurologist may insert needle electrodes at different sites  depending on your symptoms.  Sensations. The electrodes will at times transmit a tiny electrical current that you may feel as a twinge or spasm. The needle electrode may cause discomfort or pain that usually ends shortly after the needle is removed. If you are concerned about discomfort or pain, you may want to talk to the neurologist about taking a short break during the exam.  Instructions. During the needle EMG, the neurologist will assess whether there is any spontaneous electrical activity when the muscle is at rest - activity that isn't present in healthy muscle tissue - and the degree of activity when you slightly contract the muscle.  He or she will give you instructions on resting and contracting a muscle at appropriate times. Depending on what muscles and nerves the neurologist is examining, he or she may ask you to change positions during the exam.  After your EMG You may experience some temporary, minor bruising where the needle electrode was inserted into your muscle. This bruising should fade within several days. If it persists, contact your primary care doctor.

## 2024-07-02 ENCOUNTER — Telehealth: Payer: Self-pay | Admitting: Neurology

## 2024-07-02 ENCOUNTER — Telehealth: Payer: Self-pay

## 2024-07-02 ENCOUNTER — Other Ambulatory Visit: Payer: Self-pay

## 2024-07-02 DIAGNOSIS — G43709 Chronic migraine without aura, not intractable, without status migrainosus: Secondary | ICD-10-CM

## 2024-07-02 MED ORDER — SUMATRIPTAN SUCCINATE 100 MG PO TABS: 100.0000 mg | ORAL_TABLET | Freq: Once | ORAL | 5 refills | Status: AC | PRN

## 2024-07-02 MED ORDER — NORTRIPTYLINE HCL 10 MG PO CAPS: 20.0000 mg | ORAL_CAPSULE | Freq: Every day | ORAL | 5 refills | Status: DC

## 2024-07-02 MED ORDER — METHYLPREDNISOLONE 4 MG PO TBPK: ORAL_TABLET | ORAL | 0 refills | Status: AC

## 2024-07-02 NOTE — Telephone Encounter (Signed)
 Pt called and she wants a return call. Thanks

## 2024-07-02 NOTE — Telephone Encounter (Signed)
 Called pt and informed her of GoodRx , walked her through it with each medicine and how to use it. Told her to call us  with where she choose to get them.

## 2024-07-02 NOTE — Addendum Note (Signed)
 Addended by: TERRIL CHARLIES MATSU on: 07/02/2024 08:31 AM   Modules accepted: Orders

## 2024-07-02 NOTE — Telephone Encounter (Signed)
 Pt called in this morning and she stated that the prescriptions prescribe yesterday . The insurance will not pay. The prescriptions are.  nortriptyline (PAMELOR) 10 MG capsule , SUMAtriptan (IMITREX) 100 , MG tablet , methylPREDNISolone (MEDROL DOSEPAK) 4 MG TBPK tablet . Please call. Thanks

## 2024-07-02 NOTE — Telephone Encounter (Signed)
 Called pt and she wants the Meds Dr. Leigh ordered to go to [facility] on [address]

## 2024-07-02 NOTE — Telephone Encounter (Signed)
 Pt. Is asking for any other Rx or help with another version of SUMAtriptan (IMITREX) 100 MG tablet  as her insurance does not cover it?

## 2024-07-14 ENCOUNTER — Other Ambulatory Visit: Payer: Self-pay

## 2024-07-14 DIAGNOSIS — R202 Paresthesia of skin: Secondary | ICD-10-CM

## 2024-08-25 ENCOUNTER — Telehealth: Payer: Self-pay | Admitting: Neurology

## 2024-08-25 NOTE — Telephone Encounter (Signed)
 Patient returned call and was informed of Dr. Loralee response per below that he can check both arms at her EMG. Patient verbalized understanding and had no further questions or concerns.

## 2024-08-25 NOTE — Telephone Encounter (Signed)
 Who's calling (name and relationship to patient) : Connie White; Self   Best contact number: [phone]  Provider they see: Dr. Leigh   Reason for call: Ma called in stating that she is having the same symptoms that are on the right side and they are now on the left side, up to her Elbow.  She is wanting to know if she needs to schedule an EMG for that as well.

## 2024-08-25 NOTE — Telephone Encounter (Signed)
 Called patient and left a message for a call back.

## 2024-09-13 ENCOUNTER — Telehealth: Payer: Self-pay | Admitting: Neurology

## 2024-09-13 ENCOUNTER — Ambulatory Visit: Admitting: Neurology

## 2024-09-13 ENCOUNTER — Other Ambulatory Visit: Payer: Self-pay | Admitting: Neurology

## 2024-09-13 DIAGNOSIS — R202 Paresthesia of skin: Secondary | ICD-10-CM | POA: Diagnosis not present

## 2024-09-13 DIAGNOSIS — G5603 Carpal tunnel syndrome, bilateral upper limbs: Secondary | ICD-10-CM

## 2024-09-13 DIAGNOSIS — G43709 Chronic migraine without aura, not intractable, without status migrainosus: Secondary | ICD-10-CM

## 2024-09-13 MED ORDER — NORTRIPTYLINE HCL 10 MG PO CAPS: 40.0000 mg | ORAL_CAPSULE | Freq: Every day | ORAL | 5 refills | Status: AC

## 2024-09-13 NOTE — Procedures (Signed)
 " [facility]  [address] [place], [address]  [place], [place] [address] Tel: [phone] Fax: [phone] Test Date:  09/13/2024  Patient: Connie White DOB: 1996-02-26 Physician: Venetia Potters, MD  Sex: Female Height: 4' 11 Ref Phys: Venetia Potters, MD  ID#: [id number] Temp: 35.2C Technician:    History: This is a 28 year old female with bilateral hand pain.  NCV & EMG Findings: Extensive electrodiagnostic evaluation of bilateral upper limbs shows: Right median sensory response shows prolonged distal peak latency (5.1 ms) and reduced amplitude (8 V). Left median sensory response shows prolonged distal peak latency (4.7 ms). Bilateral ulnar and radial sensory responses are within normal limits. Right median (APB) motor response shows prolonged distal onset latency (4.6 ms). Left median (APB) and bilateral ulnar (ADM) motor responses are within normal limits. There is no evidence of active or chronic motor axon loss changes affecting any of the tested muscles on needle examination. Motor unit configuration and recruitment pattern is within normal limits.  Impression: This is an abnormal study. The findings are most consistent with the following: Bilateral median mononeuropathy at or distal to the wrist, consistent with carpal tunnel syndrome. The findings are moderate in degree electrically on the right and mild in degree electrically on the left. No electrodiagnostic evidence of right or left cervical (C5-T1) motor radiculopathy. Screening studies for right or left ulnar or radial mononeuropathies are normal.    ___________________________ Venetia Potters, MD    Nerve Conduction Studies Motor Nerve Results    Latency Amplitude F-Lat Segment Distance CV Comment  Site (ms) Norm (mV) Norm (ms)  (cm) (m/s) Norm   Left Median (APB) Motor  Wrist 2.4  < 3.9 6.5  > 6.0        Elbow 5.8 - 6.2 -  Elbow-Wrist 22 65  > 50   Right Median (APB) Motor  Wrist *4.6  < 3.9 10.0  > 6.0         Elbow 8.6 - 9.8 -  Elbow-Wrist 22 55  > 50   Left Ulnar (ADM) Motor  Wrist 1.53  < 3.1 16.7  > 7.0        Bel elbow 4.2 - 14.1 -  Bel elbow-Wrist 16.5 61  > 50   Ab elbow 5.7 - 13.9 -  Ab elbow-Bel elbow 10 67 -   Right Ulnar (ADM) Motor  Wrist 1.63  < 3.1 16.0  > 7.0        Bel elbow 4.2 - 15.3 -  Bel elbow-Wrist 17 65  > 50   Ab elbow 5.6 - 15.3 -  Ab elbow-Bel elbow 10 71 -    Sensory Sites    Neg Peak Lat Amplitude (O-P) Segment Distance Velocity Comment  Site (ms) Norm (V) Norm  (cm) (ms)   Left Median Sensory  Wrist-Dig II *4.7  < 3.4 21  > 20 Wrist-Dig II 13    Right Median Sensory  Wrist-Dig II *5.1  < 3.4 *8  > 20 Wrist-Dig II 13    Left Radial Sensory  Forearm-Wrist 1.95  < 2.7 50  > 18 Forearm-Wrist 10    Right Radial Sensory  Forearm-Wrist 1.95  < 2.7 49  > 18 Forearm-Wrist 10    Left Ulnar Sensory  Wrist-Dig V 2.5  < 3.1 36  > 12 Wrist-Dig V 11    Right Ulnar Sensory  Wrist-Dig V 2.3  < 3.1 46  > 12 Wrist-Dig V 11     Electromyography  Side Muscle Ins.Act Fibs Fasc Recrt Amp Dur Poly Activation Comment  Left FDI Nml Nml Nml Nml Nml Nml Nml Nml N/A  Left Pronator teres Nml Nml Nml Nml Nml Nml Nml Nml N/A  Left APB Nml Nml Nml Nml Nml Nml Nml Nml N/A  Left Biceps Nml Nml Nml Nml Nml Nml Nml Nml N/A  Left Triceps Nml Nml Nml Nml Nml Nml Nml Nml N/A  Left Deltoid Nml Nml Nml Nml Nml Nml Nml Nml N/A  Right FDI Nml Nml Nml Nml Nml Nml Nml Nml N/A  Right Pronator teres Nml Nml Nml Nml Nml Nml Nml Nml N/A  Right APB Nml Nml Nml Nml Nml Nml Nml Nml N/A  Right Biceps Nml Nml Nml Nml Nml Nml Nml Nml N/A  Right Triceps Nml Nml Nml Nml Nml Nml Nml Nml N/A  Right Deltoid Nml Nml Nml Nml Nml Nml Nml Nml N/A      Waveforms:  Motor           Sensory               "

## 2024-09-13 NOTE — Telephone Encounter (Signed)
 Discussed the results of patient's EMG after the procedure today. It showed bilateral CTS, right worse than left. Patient would like to continue to try wrist braces for now. If her symptoms worsen, we discussed the next step would be referral to hand surgery.  In terms of headaches, they have improved since starting nortriptyline  (20 mg at bedtime) but still occur at least twice a week. Sumatriptan  helps when she has a headache. We agreed to increase nortriptyline  to 40 mg at bedtime today.   Patient will follow up as planned on 11/26/24. All questions were answered.  Venetia Potters, MD [facility]

## 2024-11-26 ENCOUNTER — Ambulatory Visit: Admitting: Neurology
